# Patient Record
Sex: Male | Born: 1935 | Race: Black or African American | Hispanic: No | Smoking: Former smoker
Health system: Southern US, Community
[De-identification: ages and names within clinical notes are randomized; demographics above are authoritative.]

## PROBLEM LIST (undated history)

## (undated) DIAGNOSIS — I214 Non-ST elevation (NSTEMI) myocardial infarction: Secondary | ICD-10-CM

## (undated) DIAGNOSIS — I5020 Unspecified systolic (congestive) heart failure: Secondary | ICD-10-CM

## (undated) DIAGNOSIS — E785 Hyperlipidemia, unspecified: Secondary | ICD-10-CM

## (undated) DIAGNOSIS — I2510 Atherosclerotic heart disease of native coronary artery without angina pectoris: Secondary | ICD-10-CM

## (undated) DIAGNOSIS — I10 Essential (primary) hypertension: Secondary | ICD-10-CM

## (undated) DIAGNOSIS — N1832 Chronic kidney disease, stage 3b: Secondary | ICD-10-CM

## (undated) DIAGNOSIS — I509 Heart failure, unspecified: Secondary | ICD-10-CM

## (undated) DIAGNOSIS — J302 Other seasonal allergic rhinitis: Secondary | ICD-10-CM

## (undated) DIAGNOSIS — T782XXA Anaphylactic shock, unspecified, initial encounter: Secondary | ICD-10-CM

## (undated) DIAGNOSIS — I219 Acute myocardial infarction, unspecified: Secondary | ICD-10-CM

## (undated) HISTORY — DX: Acute myocardial infarction, unspecified: I21.9

## (undated) HISTORY — DX: Essential (primary) hypertension: I10

## (undated) HISTORY — PX: CORONARY ANGIOPLASTY WITH STENT PLACEMENT: SHX49

## (undated) HISTORY — PX: CARDIAC CATHETERIZATION: SHX172

## (undated) HISTORY — DX: Chronic kidney disease, stage 3b: N18.32

## (undated) HISTORY — DX: Hyperlipidemia, unspecified: E78.5

## (undated) HISTORY — DX: Atherosclerotic heart disease of native coronary artery without angina pectoris: I25.10

## (undated) HISTORY — DX: Heart failure, unspecified: I50.9

## (undated) HISTORY — DX: Anaphylactic shock, unspecified, initial encounter: T78.2XXA

## (undated) HISTORY — DX: Unspecified systolic (congestive) heart failure: I50.20

---

## 1996-06-30 DIAGNOSIS — I219 Acute myocardial infarction, unspecified: Secondary | ICD-10-CM

## 1996-06-30 HISTORY — DX: Acute myocardial infarction, unspecified: I21.9

## 2000-12-20 ENCOUNTER — Encounter: Payer: Self-pay | Admitting: Emergency Medicine

## 2000-12-20 ENCOUNTER — Emergency Department (HOSPITAL_COMMUNITY): Admission: EM | Admit: 2000-12-20 | Discharge: 2000-12-20 | Payer: Self-pay | Admitting: Emergency Medicine

## 2005-07-15 ENCOUNTER — Emergency Department (HOSPITAL_COMMUNITY): Admission: EM | Admit: 2005-07-15 | Discharge: 2005-07-15 | Payer: Self-pay | Admitting: Emergency Medicine

## 2008-03-30 DIAGNOSIS — I214 Non-ST elevation (NSTEMI) myocardial infarction: Secondary | ICD-10-CM

## 2008-03-30 HISTORY — DX: Non-ST elevation (NSTEMI) myocardial infarction: I21.4

## 2008-04-24 ENCOUNTER — Inpatient Hospital Stay (HOSPITAL_COMMUNITY): Admission: AD | Admit: 2008-04-24 | Discharge: 2008-04-28 | Payer: Self-pay | Admitting: Cardiology

## 2008-04-24 ENCOUNTER — Ambulatory Visit: Payer: Self-pay | Admitting: Cardiovascular Disease

## 2008-04-25 ENCOUNTER — Encounter: Payer: Self-pay | Admitting: Cardiology

## 2008-05-12 ENCOUNTER — Ambulatory Visit: Payer: Self-pay | Admitting: Cardiovascular Disease

## 2008-05-23 ENCOUNTER — Encounter (HOSPITAL_COMMUNITY): Admission: RE | Admit: 2008-05-23 | Discharge: 2008-06-28 | Payer: Self-pay | Admitting: Cardiology

## 2008-07-03 ENCOUNTER — Encounter (HOSPITAL_COMMUNITY): Admission: RE | Admit: 2008-07-03 | Discharge: 2008-08-02 | Payer: Self-pay | Admitting: Cardiovascular Disease

## 2008-08-02 ENCOUNTER — Encounter (HOSPITAL_COMMUNITY): Admission: RE | Admit: 2008-08-02 | Discharge: 2008-09-01 | Payer: Self-pay | Admitting: Cardiovascular Disease

## 2008-09-01 ENCOUNTER — Encounter (HOSPITAL_COMMUNITY): Admission: RE | Admit: 2008-09-01 | Discharge: 2008-10-01 | Payer: Self-pay | Admitting: Cardiovascular Disease

## 2008-09-15 ENCOUNTER — Ambulatory Visit: Payer: Self-pay | Admitting: Cardiology

## 2008-09-19 ENCOUNTER — Ambulatory Visit: Payer: Self-pay | Admitting: Cardiology

## 2008-09-19 ENCOUNTER — Ambulatory Visit (HOSPITAL_COMMUNITY): Admission: RE | Admit: 2008-09-19 | Discharge: 2008-09-19 | Payer: Self-pay | Admitting: Cardiology

## 2008-09-19 ENCOUNTER — Encounter: Payer: Self-pay | Admitting: Cardiology

## 2008-10-23 ENCOUNTER — Ambulatory Visit: Payer: Self-pay | Admitting: Cardiology

## 2009-01-03 ENCOUNTER — Encounter (INDEPENDENT_AMBULATORY_CARE_PROVIDER_SITE_OTHER): Payer: Self-pay | Admitting: *Deleted

## 2009-01-03 ENCOUNTER — Encounter: Payer: Self-pay | Admitting: Cardiology

## 2009-01-03 LAB — CONVERTED CEMR LAB
ALT: 21 units/L
ALT: 21 units/L
AST: 23 units/L
AST: 23 units/L
Albumin: 3.9 g/dL
Albumin: 3.9 g/dL
Alkaline Phosphatase: 36 units/L
Alkaline Phosphatase: 36 units/L
Bilirubin, Direct: 0.1 mg/dL
Bilirubin, Direct: 0.1 mg/dL
Cholesterol: 152 mg/dL
Cholesterol: 152 mg/dL
HDL: 29 mg/dL
HDL: 29 mg/dL
LDL Cholesterol: 96 mg/dL
LDL Cholesterol: 96 mg/dL
Total Protein: 6.6 g/dL
Total Protein: 6.6 g/dL
Triglycerides: 137 mg/dL
Triglycerides: 137 mg/dL

## 2009-01-05 LAB — CONVERTED CEMR LAB
ALT: 21 units/L (ref 0–53)
AST: 23 units/L (ref 0–37)
Albumin: 3.9 g/dL (ref 3.5–5.2)
Alkaline Phosphatase: 36 units/L — ABNORMAL LOW (ref 39–117)
Bilirubin, Direct: 0.1 mg/dL (ref 0.0–0.3)
Cholesterol: 152 mg/dL (ref 0–200)
HDL: 29 mg/dL — ABNORMAL LOW (ref >39)
Indirect Bilirubin: 0.6 mg/dL (ref 0.0–0.9)
LDL Cholesterol: 96 mg/dL (ref 0–99)
Total Bilirubin: 0.7 mg/dL (ref 0.3–1.2)
Total CHOL/HDL Ratio: 5.2
Total Protein: 6.6 g/dL (ref 6.0–8.3)
Triglycerides: 137 mg/dL (ref <150)
VLDL: 27 mg/dL (ref 0–40)

## 2009-01-09 ENCOUNTER — Telehealth (INDEPENDENT_AMBULATORY_CARE_PROVIDER_SITE_OTHER): Payer: Self-pay | Admitting: *Deleted

## 2009-03-12 ENCOUNTER — Telehealth (INDEPENDENT_AMBULATORY_CARE_PROVIDER_SITE_OTHER): Payer: Self-pay | Admitting: *Deleted

## 2009-03-12 ENCOUNTER — Encounter: Payer: Self-pay | Admitting: Cardiology

## 2009-04-23 ENCOUNTER — Encounter: Payer: Self-pay | Admitting: Adult Health

## 2009-06-12 ENCOUNTER — Emergency Department (HOSPITAL_COMMUNITY): Admission: EM | Admit: 2009-06-12 | Discharge: 2009-06-12 | Payer: Self-pay | Admitting: Emergency Medicine

## 2009-07-19 ENCOUNTER — Telehealth (INDEPENDENT_AMBULATORY_CARE_PROVIDER_SITE_OTHER): Payer: Self-pay

## 2009-07-25 ENCOUNTER — Encounter (INDEPENDENT_AMBULATORY_CARE_PROVIDER_SITE_OTHER): Payer: Self-pay | Admitting: *Deleted

## 2009-07-26 ENCOUNTER — Encounter (INDEPENDENT_AMBULATORY_CARE_PROVIDER_SITE_OTHER): Payer: Self-pay | Admitting: *Deleted

## 2009-08-09 ENCOUNTER — Ambulatory Visit: Payer: Self-pay | Admitting: Cardiology

## 2009-08-09 DIAGNOSIS — E782 Mixed hyperlipidemia: Secondary | ICD-10-CM | POA: Insufficient documentation

## 2009-08-09 DIAGNOSIS — I2510 Atherosclerotic heart disease of native coronary artery without angina pectoris: Secondary | ICD-10-CM | POA: Insufficient documentation

## 2009-08-09 DIAGNOSIS — I10 Essential (primary) hypertension: Secondary | ICD-10-CM | POA: Insufficient documentation

## 2009-08-10 ENCOUNTER — Encounter: Payer: Self-pay | Admitting: Cardiology

## 2009-08-15 LAB — CONVERTED CEMR LAB
ALT: 24 units/L (ref 0–53)
AST: 22 units/L (ref 0–37)
Albumin: 4.3 g/dL (ref 3.5–5.2)
Alkaline Phosphatase: 37 units/L — ABNORMAL LOW (ref 39–117)
Cholesterol: 135 mg/dL (ref 0–200)
HDL: 27 mg/dL — ABNORMAL LOW (ref >39)
Indirect Bilirubin: 0.4 mg/dL (ref 0.0–0.9)
LDL Cholesterol: 79 mg/dL (ref 0–99)
Total Protein: 7 g/dL (ref 6.0–8.3)
Triglycerides: 146 mg/dL (ref <150)

## 2009-08-16 ENCOUNTER — Encounter: Payer: Self-pay | Admitting: Cardiology

## 2010-04-17 ENCOUNTER — Ambulatory Visit: Payer: Self-pay | Admitting: Cardiology

## 2010-04-18 ENCOUNTER — Telehealth: Payer: Self-pay | Admitting: Cardiology

## 2010-05-01 ENCOUNTER — Encounter: Payer: Self-pay | Admitting: Cardiology

## 2010-05-01 LAB — CONVERTED CEMR LAB
Alkaline Phosphatase: 37 units/L — ABNORMAL LOW (ref 39–117)
Bilirubin, Direct: 0.1 mg/dL (ref 0.0–0.3)
Indirect Bilirubin: 0.5 mg/dL (ref 0.0–0.9)
LDL Cholesterol: 82 mg/dL (ref 0–99)
Total Bilirubin: 0.6 mg/dL (ref 0.3–1.2)
Total Protein: 6.8 g/dL (ref 6.0–8.3)
Triglycerides: 133 mg/dL (ref <150)
VLDL: 27 mg/dL (ref 0–40)

## 2010-07-30 NOTE — Assessment & Plan Note (Signed)
Summary: ROV  Medications Added LIPITOR 80 MG TABS (ATORVASTATIN CALCIUM) Take one tablet by mouth daily. PLAVIX 75 MG TABS (CLOPIDOGREL BISULFATE) Take 1   tablet by mouth daily LISINOPRIL 20 MG TABS (LISINOPRIL) Take one tablet by mouth twice daily AMLODIPINE BESYLATE 2.5 MG TABS (AMLODIPINE BESYLATE) take 1 tablet by mouth once daily      Allergies Added: NKDA  Visit Type:  Follow-up Primary Provider:  Dr. Karleen Hampshire   History of Present Illness: 75 year old male presents for followup. He reports doing well without any significant angina or progressive shortness of breath. He has not been exercising as regularly, but is planning to return to the [facility]. He reports compliance with his medications.  Followup labs from July 2010 revealed ALT 21, AST 23, LDL 96, HDL 29, triglycerides 137, total cholesterol 152.  Today we discussed his blood pressure and also a goal for weight loss. He is due for a followup lipid profile and liver function tests as well.  Current Medications (verified): 1)  Lipitor 80 Mg Tabs (Atorvastatin Calcium) .... Take One Tablet By Mouth Daily. 2)  Plavix 75 Mg Tabs (Clopidogrel Bisulfate) .... Take 1   Tablet By Mouth Daily 3)  Carvedilol 12.5 Mg Tabs (Carvedilol) .... Take One Tablet By Mouth Twice A Day 4)  Lisinopril 20 Mg Tabs (Lisinopril) .... Take One Tablet By Mouth Twice Daily 5)  Nitrolingual 0.4 Mg/spray Soln (Nitroglycerin) .... Use 1 Spray Every 5 Minutes X3 For Chest Pain 6)  Amlodipine Besylate 2.5 Mg Tabs (Amlodipine Besylate) .... Take 1 Tablet By Mouth Once Daily  Allergies (verified): No Known Drug Allergies  Past History:  Past Medical History: Last updated: 07/31/2009 CAD - BMS to RCA and circumflex 10/09, LVEF 55% Aspirin allergy - anaphylaxis Hyperlipidemia Myocardial Infarction - NSTEMI 10/09 Hypertension  Social History: Last updated: 07/31/2009 Widowed  Tobacco Use - No Alcohol Use - no  Clinical Review  Panels:  Echocardiogram Echocardiogram SUMMARY   -  Overall left ventricular systolic function was at the lower         limits of normal. Left ventricular ejection fraction was         estimated , range being 50 % to 55 %. There was no diagnostic         evidence of left ventricular regional wall motion         abnormalities. Left ventricular wall thickness was mildly         increased.   -  There was mild mitral annular calcification.   -  The left atrium was mildly dilated. (09/19/2008)  Cardiac Imaging Cardiac Cath Findings FINDINGS:  Aortic pressure 144/82 with mean of 108, left ventricular   pressure 143/15.      Coronary angiography, left mainstem:  There is no significant stenosis.   The left main bifurcates into the LAD and left circumflex.  There is   mild plaque in the distal left main, but again it appears widely patent.      LAD:  The LAD is a large caliber vessel.  It courses down and reaches   the LV apex.  There is mild calcification throughout.  Just at the first   septal perforator and first diagonal, there is a moderate 50% stenosis   of the LAD.  The first septal perforator has a tight ostial stenosis and   the first diagonal branch has a 30-40% proximal stenosis.  The LAD   tapers in size just after the first diagonal.  There are no high-grade   stenosis throughout the LAD or its branch vessels.      Left circumflex:  The left circumflex has a severe proximal stenosis.   There is an intermediate branch that is widely patent.  The proximal   circumflex has a 90% eccentric stenosis just before the first OM branch.   The circumflex then courses down and bifurcates into twin vessels   supplying two more obtuse marginal branches.  There is TIMI III flow on   the left circumflex.      Right coronary artery:  The right coronary artery has moderate ostial   stenosis at 50%.  The midportion of the right coronary artery is totally   occluded.  The distal branches of  the right coronary artery fill faintly   from left-to-right collaterals.  The RCA is occluded in the midportion   just beyond the origin of the first RV marginal branch.  There is TIMI 0   flow beyond that region.      Left ventriculography demonstrates a large area of inferior wall   akinesis from the base of the inferior wall all the way to the distal   inferior wall but spares the true apex.  The remaining portions of the   left ventricle are hyperdynamic.  The LVEF is estimated at 45%.  There   is no significant mitral regurgitation. (04/24/2008)    Review of Systems  The patient denies anorexia, fever, weight loss, chest pain, syncope, peripheral edema, prolonged cough, headaches, hemoptysis, melena, and hematochezia.         Otherwise reviewed and negative.  Vital Signs:  Patient profile:   75 year old male Height:      68 inches Weight:      234 pounds BMI:     35.71 Pulse rate:   54 / minute BP sitting:   176 / 92  (right arm)  Vitals Entered By: Dreama Saa, CNA (August 09, 2009 3:14 PM)  Physical Exam  Additional Exam:  Obese male in no acute distress. HEENT: Conjunctiva and lids normal, oropharynx clear. Neck: Supple, no elevated jugular venous pressure or carotid bruits. Lungs: Clear to auscultation, nonlabored. Cardiac: Regular rate and rhythm, no S3. Abdomen: Soft, nontender, no bruits. Extremities: No pitting edema, distal pulses one plus. Skin: Warm and dry. Musculoskeletal: No kyphosis. Neuropsychiatric: Alert and oriented x3, affect appropriate.   Impression & Recommendations:  Problem # 1:  CORONARY ATHEROSCLEROSIS NATIVE CORONARY ARTERY (ICD-414.01)  Clinically stable on present medical regimen. I have encouraged more regular exercise and weight loss. Patient plans to resume his work out at [employer]. I will see him back in 6 months.  His updated medication list for this problem includes:    Plavix 75 Mg Tabs (Clopidogrel bisulfate) .[name]... Take  1   tablet by mouth daily    Carvedilol 12.5 Mg Tabs (Carvedilol) .[name]... Take one tablet by mouth twice a day    Lisinopril 20 Mg Tabs (Lisinopril) .[name]... Take one tablet by mouth twice daily    Nitrolingual 0.4 Mg/spray Soln (Nitroglycerin) ..... Use 1 spray every 5 minutes x3 for chest pain    Amlodipine Besylate 2.5 Mg Tabs (Amlodipine besylate) .[name]... Take 1 tablet by mouth once daily  Problem # 2:  HYPERTENSION (ICD-401.9)  Blood pressure not well controlled. Patient will work on weight loss. We discussed adding amlodipine 2.5 mg daily as well.  His updated medication list for this problem includes:    Carvedilol 12.5  Mg Tabs (Carvedilol) .[name]... Take one tablet by mouth twice a day    Lisinopril 20 Mg Tabs (Lisinopril) .[name]... Take one tablet by mouth twice daily    Amlodipine Besylate 2.5 Mg Tabs (Amlodipine besylate) .[name]... Take 1 tablet by mouth once daily  Problem # 3:  HYPERLIPIDEMIA (ICD-272.4)  Followup lipid profile and liver function tests. He continues on Lipitor.  His updated medication list for this problem includes:    Lipitor 80 Mg Tabs (Atorvastatin calcium) .[name]... Take one tablet by mouth daily.  Orders: T-Lipid Profile [id number]) T-Hepatic Function [id number])  Other Orders: TLB-PSA (Prostate Specific Antigen) (84153-PSA)  Patient Instructions: 1)  Your physician recommends that you schedule a follow-up appointment in: 6 months 2)  Your physician recommends that you return for lab work in: This week 3)  Your physician has recommended you make the following change in your medication: Start taking Amlodipine 2.5mg  by mouth once daily  Prescriptions: LIPITOR 80 MG TABS (ATORVASTATIN CALCIUM) Take one tablet by mouth daily.  #90 x 1   Entered by:   Larita Fife Via LPN   Authorized by:   Loreli Slot, MD, [facility]   Signed by:   Larita Fife Via LPN on 13/01/6577   Method used:   Electronically to        [facility]  [address] (retail)       [address]        [place], [place]  [address]       Ph: [phone]       Fax: [phone]   RxID:   [id number] AMLODIPINE BESYLATE 2.5 MG TABS (AMLODIPINE BESYLATE) take 1 tablet by mouth once daily  #90 x 1   Entered by:   Larita Fife Via LPN   Authorized by:   Loreli Slot, MD, [facility]   Signed by:   Larita Fife Via LPN on 64/33/2951   Method used:   Electronically to        [facility]  [place] [address] (retail)       [address]       [place], [place]  [address]       Ph: [phone]       Fax: [phone]   RxID:   [id number] AMLODIPINE BESYLATE 2.5 MG TABS (AMLODIPINE BESYLATE) take 1 tablet by mouth once daily  #30 x 5   Entered by:   Larita Fife Via LPN   Authorized by:   Loreli Slot, MD, [facility]   Signed by:   Larita Fife Via LPN on 51/76/1607   Method used:   Electronically to        [facility]   [address] (retail)       [address]       [place], [place]  [address]       Ph: [phone]       Fax: [phone]   RxID:   [id number]  Patient wanted 90 day supply, [facility] notified.

## 2010-07-30 NOTE — Letter (Signed)
Summary: [place] Results Engineer, agricultural at [facility]  [address] [place] [address]   Phone: [phone]  Fax: [phone]      August 16, 2009 MRN: [id number]   [facility] [address] [place], [place]  [address]   Dear Mr. [facility],  Your test ordered by Selena Batten has been reviewed by your physician (or physician assistant) and was found to be normal or stable. Your physician (or physician assistant) felt no changes were needed at this time.  ____ Echocardiogram  ____ Cardiac Stress Test  __X__ Lab Work  ____ Peripheral vascular study of arms, legs or neck  ____ CT scan or X-ray  ____ Lung or Breathing test  ____ Other: Please continue on current medical treatment.  Thank you.   Nona Dell, MD, F.A.C.C

## 2010-07-30 NOTE — Progress Notes (Signed)
Summary: PT NEEDS ALL MEDS REFILLED   Phone Note Call from Patient Call back at Home Phone [phone]   Caller: PT Reason for Call: Refill Medication Summary of Call: PT WALK IN THIS MORNING WANTING TO KNOW IF HIS MED HAS BEEN CALLED IN, AFTER TALKING WITH LYNN WE FIGURED OUT THAT HIS AMOLODIPINE WAS CALLED IN. I LET PATIENT KNOW TO TELL PHARMAC[facility] TO CALL us IF THEY HAVE NOT RECIEVED THEM.  PT IS BACK IN OFFICE STATING THAT ALL OF HIS MEDICATIONS CALLED IN. HE DOESNT KNOW THE NAME OF THEM, HE JUST NEEDS ALL OF THEM CALLED IN TO [facility] IN [place]. Initial call taken by: Faythe Ghee,  April 18, 2010 2:43 PM    Prescriptions: LISINOPRIL 20 MG TABS (LISINOPRIL) Take one tablet by mouth twice daily  #180 x 1   Entered by:   Larita Fife Via LPN   Authorized by:   Loreli Slot, MD, [facility]   Signed by:   Larita Fife Via LPN on 52/84/1324   Method used:   Electronically to        [facility]  [place] [address] (retail)       [address]       [place], [place]  [address]       Ph: [phone]       Fax: [phone]   RxID:   [id number] CARVEDILOL 12.5 MG TABS (CARVEDILOL) Take one tablet by mouth twice a day  #180 x 1   Entered by:   Larita Fife Via LPN   Authorized by:   Loreli Slot, MD, [facility]   Signed by:   Larita Fife Via LPN on 12/28/1599   Method used:   Electronically to        [facility]  [address] [address] (retail)       [address]       [place], [place]  [address]       Ph: [phone]       Fax: [phone]   RxID:   [id number] PLAVIX 75 MG TABS (CLOPIDOGREL BISULFATE) Take 1   tablet by mouth daily  #90 x 1   Entered by:   Larita Fife Via LPN   Authorized by:   Loreli Slot, MD, [facility]   Signed by:   Larita Fife Via LPN on 69/48/5462   Method used:   Electronically to        [facility]  [place] [address] (retail)       [address]       [place], [place]  [address]       Ph: [phone]       Fax: [phone]   RxID:    [id number] LIPITOR 80 MG TABS (ATORVASTATIN CALCIUM) Take one tablet by mouth daily.  #90 Each x 1   Entered by:   Larita Fife Via LPN   Authorized by:   Loreli Slot, MD, [facility]   Signed by:   Larita Fife Via LPN on 42/35/3614   Method used:   Electronically to        [facility] [address] (retail)       [address]       [place], [place]  [address]       Ph: [phone]       Fax: [phone]   RxID:  [id number]  

## 2010-07-30 NOTE — Letter (Signed)
Summary: [place] Results Engineer, agricultural at [facility]  [address] [place] [address]   Phone: [phone]  Fax: [phone]      May 01, 2010 MRN: [id number]   [facility] [address] [place], [place]  [address]   Dear Mr. [facility],  Your test ordered by Selena Batten has been reviewed by your physician (or physician assistant) and was found to be normal or stable. Your physician (or physician assistant) felt no changes were needed at this time.  ____ Echocardiogram  ____ Cardiac Stress Test  __X__ Lab Work  ____ Peripheral vascular study of arms, legs or neck  ____ CT scan or X-ray  ____ Lung or Breathing test  ____ Other:  Please continue on current medical treatment.  Thank you.   Nona Dell, MD, F.A.C.C

## 2010-07-30 NOTE — Miscellaneous (Signed)
Summary: labs lipids,liver 01/03/2009  Clinical Lists Changes  Observations: Added new observation of ALBUMIN: 3.9 g/dL (16/03/9603 54:09) Added new observation of PROTEIN, TOT: 6.6 g/dL (81/19/1478 29:56) Added new observation of SGPT (ALT): 21 units/L (01/03/2009 11:40) Added new observation of SGOT (AST): 23 units/L (01/03/2009 11:40) Added new observation of ALK PHOS: 36 units/L (01/03/2009 11:40) Added new observation of BILI DIRECT: 0.1 mg/dL (21/30/8657 84:69) Added new observation of LDL: 96 mg/dL (62/95/2841 32:44) Added new observation of HDL: 29 mg/dL (07/02/7251 66:44) Added new observation of TRIGLYC TOT: 137 mg/dL (03/47/4259 56:38) Added new observation of CHOLESTEROL: 152 mg/dL (75/64/3329 51:88)

## 2010-07-30 NOTE — Miscellaneous (Signed)
Summary: lipids,liver 01/03/2009  Clinical Lists Changes  Observations: Added new observation of ALBUMIN: 3.9 g/dL (62/95/2841 32:44) Added new observation of PROTEIN, TOT: 6.6 g/dL (07/02/7251 66:44) Added new observation of SGPT (ALT): 21 units/L (01/03/2009 16:42) Added new observation of SGOT (AST): 23 units/L (01/03/2009 16:42) Added new observation of ALK PHOS: 36 units/L (01/03/2009 16:42) Added new observation of BILI DIRECT: 0.1 mg/dL (03/47/4259 56:38) Added new observation of LDL: 96 mg/dL (75/64/3329 51:88) Added new observation of HDL: 29 mg/dL (41/66/0630 16:01) Added new observation of TRIGLYC TOT: 137 mg/dL (09/32/3557 32:20) Added new observation of CHOLESTEROL: 152 mg/dL (25/42/7062 37:62)

## 2010-07-30 NOTE — Progress Notes (Signed)
Summary: Refill   Phone Note Call from Patient   Caller: Patient (Walk in) Reason for Call: Refill Medication Summary of Call: Needs refill for Lipitor called to [facility] in [place] Initial call taken by: Raechel Ache [facility],  July 19, 2009 3:26 PM    Prescriptions: LIPITOR 80 MG TABS (ATORVASTATIN CALCIUM) Take one tablet by mouth daily.  #30 x 0   Entered by:   Larita Fife Via LPN   Authorized by:   Loreli Slot, MD, [facility]   Signed by:   Larita Fife Via LPN on 81/19/1478   Method used:   Electronically to        [facility]  [address] (retail)       [address]       [place], [place]  [address]       Ph: [phone]       Fax: [phone]   RxID:   [id number]   Appended Document: Refill     Phone Note Outgoing Call   Call placed by: Larita Fife Via LPN,  July 19, 2009 4:05 PM Action Taken: Appt scheduled Summary of Call: Patient is overdue for f/u. I advised him that he must be seen in this office in order for [place] to continue refills. I sent 30 day suppy of Lipitor to [facility] ph[facility]. and patient scheduled appt. to fu with Dr. Diona Browner.

## 2010-07-30 NOTE — Assessment & Plan Note (Signed)
Summary: 6 mth f/u per checkout on 04/16/10/tg  Medications Added AMLODIPINE BESYLATE 5 MG TABS (AMLODIPINE BESYLATE) take 1 tablet by mouth once daily      Allergies Added: NKDA  Visit Type:  Follow-up Primary Provider:  Dr. Karleen Hampshire   History of Present Illness: 75 year old male presents for followup. He was seen back in February. He continues to exercise at the [facility] 5-6 days a week. No reported angina or progressive shortness of breath. He celebrated a  birthday since I last saw him.  Labs obtained back in February demonstrated cholesterol 135, triglycerides 146, HDL 27, LDL 79, AST 22, ALT 24, PSA 0.63. He is due for followup lipids and liver function tests. He states he plans to get these done with Dr. Regino Schultze.  He is just at 2 years out from his cardiac intervention. ECG is reviewed below.  Blood pressure remains elevated. He states that systolics generally are "150" when he checks at [facility].  Clinical Review Panels:  Cardiac Imaging Cardiac Cath Findings FINDINGS:  Aortic pressure 144/82 with mean of 108, left ventricular   pressure 143/15.      Coronary angiography, left mainstem:  There is no significant stenosis.   The left main bifurcates into the LAD and left circumflex.  There is   mild plaque in the distal left main, but again it appears widely patent.      LAD:  The LAD is a large caliber vessel.  It courses down and reaches   the LV apex.  There is mild calcification throughout.  Just at the first   septal perforator and first diagonal, there is a moderate 50% stenosis   of the LAD.  The first septal perforator has a tight ostial stenosis and   the first diagonal branch has a 30-40% proximal stenosis.  The LAD   tapers in size just after the first diagonal.  There are no high-grade   stenosis throughout the LAD or its branch vessels.      Left circumflex:  The left circumflex has a severe proximal stenosis.   There is an intermediate branch that is  widely patent.  The proximal   circumflex has a 90% eccentric stenosis just before the first OM branch.   The circumflex then courses down and bifurcates into twin vessels   supplying two more obtuse marginal branches.  There is TIMI III flow on   the left circumflex.      Right coronary artery:  The right coronary artery has moderate ostial   stenosis at 50%.  The midportion of the right coronary artery is totally   occluded.  The distal branches of the right coronary artery fill faintly   from left-to-right collaterals.  The RCA is occluded in the midportion   just beyond the origin of the first RV marginal branch.  There is TIMI 0   flow beyond that region.      Left ventriculography demonstrates a large area of inferior wall   akinesis from the base of the inferior wall all the way to the distal   inferior wall but spares the true apex.  The remaining portions of the   left ventricle are hyperdynamic.  The LVEF is estimated at 45%.  There   is no significant mitral regurgitation. (04/24/2008)    Current Medications (verified): 1)  Lipitor 80 Mg Tabs (Atorvastatin Calcium) .... Take One Tablet By Mouth Daily. 2)  Plavix 75 Mg Tabs (Clopidogrel Bisulfate) .... Take 1   Tablet  By Mouth Daily 3)  Carvedilol 12.5 Mg Tabs (Carvedilol) .... Take One Tablet By Mouth Twice A Day 4)  Lisinopril 20 Mg Tabs (Lisinopril) .... Take One Tablet By Mouth Twice Daily 5)  Nitrolingual 0.4 Mg/spray Soln (Nitroglycerin) .... Use 1 Spray Every 5 Minutes X3 For Chest Pain 6)  Amlodipine Besylate 5 Mg Tabs (Amlodipine Besylate) .... Take 1 Tablet By Mouth Once Daily  Allergies (verified): No Known Drug Allergies  Comments:  Nurse/Medical Assistant: patient reviewed med list from previous ov and stated that meds are correct  Past History:  Past Medical History: Last updated: 07/31/2009 CAD - BMS to RCA and circumflex 10/09, LVEF 55% Aspirin allergy - anaphylaxis Hyperlipidemia Myocardial  Infarction - NSTEMI 10/09 Hypertension  Social History: Last updated: 07/31/2009 Widowed  Tobacco Use - No Alcohol Use - no  Review of Systems  The patient denies anorexia, fever, weight loss, chest pain, syncope, dyspnea on exertion, peripheral edema, melena, hematochezia, and severe indigestion/heartburn.         Oreviewed and negative except as outlined.  Vital Signs:  Patient profile:   75 year old male Weight:      229 pounds BMI:     34.95 Pulse rate:   58 / minute BP sitting:   157 / 83  (right arm)  Vitals Entered By: Dreama Saa, CNA (April 17, 2010 3:15 PM)  Physical Exam  Additional Exam:  Obese male in no acute distress. HEENT: Conjunctiva and lids normal, oropharynx clear. Neck: Supple, no elevated jugular venous pressure or carotid bruits. Lungs: Clear to auscultation, nonlabored. Cardiac: Regular rate and rhythm, no S3. Abdomen: Soft, nontender, no bruits. Extremities: No pitting edema, distal pulses one plus. Skin: Warm and dry. Musculoskeletal: No kyphosis. Neuropsychiatric: Alert and oriented x3, affect appropriate.   EKG  Procedure date:  04/17/2010  Findings:      Sinus rhythm at 63 beats per minutes with occasional frequent PACs, nonspecific ST changes with inferolateral T wave inversions that are old.  Impression & Recommendations:  Problem # 1:  CORONARY ATHEROSCLEROSIS NATIVE CORONARY ARTERY (ICD-414.01)  Symptomatically stable on medical therapy. I encouraged continued regular exercise. Followup in 6 months, sooner if needed.  His updated medication list for this problem includes:    Plavix 75 Mg Tabs (Clopidogrel bisulfate) .[name]... Take 1   tablet by mouth daily    Carvedilol 12.5 Mg Tabs (Carvedilol) .[name]... Take one tablet by mouth twice a day    Lisinopril 20 Mg Tabs (Lisinopril) .[name]... Take one tablet by mouth twice daily    Nitrolingual 0.4 Mg/spray Soln (Nitroglycerin) ..... Use 1 spray every 5 minutes x3 for chest pain     Amlodipine Besylate 5 Mg Tabs (Amlodipine besylate) .[name]... Take 1 tablet by mouth once daily  Problem # 2:  HYPERLIPIDEMIA (ICD-272.4)  Patient due for followup lipid profile and liver function tests. He plans to get these done with Dr. Regino Schultze.  His updated medication list for this problem includes:    Lipitor 80 Mg Tabs (Atorvastatin calcium) .[name]... Take one tablet by mouth daily.  Orders: T-Lipid Profile [phone]) T-Hepatic Function [phone])  Problem # 3:  HYPERTENSION (ICD-401.9)  Blood pressure not optimally controlled. We have discussed sodium restriction. Norvasc will be increased to 5 mg daily.  His updated medication list for this problem includes:    Carvedilol 12.5 Mg Tabs (Carvedilol) .[name]... Take one tablet by mouth twice a day    Lisinopril 20 Mg Tabs (Lisinopril) .[name]... Take one tablet by mouth twice  daily    Amlodipine Besylate 5 Mg Tabs (Amlodipine besylate) .[name]... Take 1 tablet by mouth once daily  His updated medication list for this problem includes:    Carvedilol 12.5 Mg Tabs (Carvedilol) .[name]... Take one tablet by mouth twice a day    Lisinopril 20 Mg Tabs (Lisinopril) .[name]... Take one tablet by mouth twice daily    Amlodipine Besylate 5 Mg Tabs (Amlodipine besylate) .[name]... Take 1 tablet by mouth once daily  Patient Instructions: 1)  Your physician recommends that you schedule a follow-up appointment in: 6 months 2)  Your physician recommends that you return for lab work in: This week 3)  Your physician has recommended you make the following change in your medication: Increase Norvasc to 5mg  by mouth once daily  Prescriptions: AMLODIPINE BESYLATE 5 MG TABS (AMLODIPINE BESYLATE) take 1 tablet by mouth once daily  #30 x 6   Entered by:   Larita Fife Via LPN   Authorized by:   Loreli Slot, MD, [facility]   Signed by:   Larita Fife Via LPN on 16/03/9603   Method used:   Electronically to        [facility]  [address] (retail)       [address]       [place], [place]  [address]       Ph: [phone]       Fax: [phone]   RxID:   [id number]

## 2010-10-11 ENCOUNTER — Ambulatory Visit: Payer: Self-pay | Admitting: Cardiology

## 2010-10-17 ENCOUNTER — Other Ambulatory Visit: Payer: Self-pay | Admitting: Cardiology

## 2010-10-18 ENCOUNTER — Other Ambulatory Visit: Payer: Self-pay

## 2010-10-18 MED ORDER — CARVEDILOL 12.5 MG PO TABS: 12.5000 mg | ORAL_TABLET | Freq: Two times a day (BID) | ORAL | Status: DC

## 2010-11-05 ENCOUNTER — Ambulatory Visit (INDEPENDENT_AMBULATORY_CARE_PROVIDER_SITE_OTHER): Payer: 59 | Admitting: Cardiology

## 2010-11-05 ENCOUNTER — Encounter: Payer: Self-pay | Admitting: Cardiology

## 2010-11-05 VITALS — BP 123/69 | HR 65 | Wt 231.0 lb

## 2010-11-05 DIAGNOSIS — I10 Essential (primary) hypertension: Secondary | ICD-10-CM

## 2010-11-05 DIAGNOSIS — I2510 Atherosclerotic heart disease of native coronary artery without angina pectoris: Secondary | ICD-10-CM

## 2010-11-05 DIAGNOSIS — E782 Mixed hyperlipidemia: Secondary | ICD-10-CM

## 2010-11-05 NOTE — Patient Instructions (Signed)
**Note De-Identified Jerilyn Gillaspie Obfuscation** Your physician recommends that you continue on your current medications as directed. Please refer to the Current Medication list given to you today.  Your physician recommends that you return for lab work in: this week  Your physician recommends that you schedule a follow-up appointment in: 6 months

## 2010-11-05 NOTE — Assessment & Plan Note (Signed)
Followup fasting lipid profile and liver function tests.

## 2010-11-05 NOTE — Progress Notes (Signed)
Clinical Summary Corey [name] is a 75 y.o.male presenting for followup. He was seen in October 2011. Reports occasional chest pain, not specifically exertional. He has not required any nitroglycerin. Continues to exercise. He does not feel like the symptoms are progressing.  Lab work from November 2011 showed cholesterol 138, triglycerides 133, HDL 29, LDL 82, AST 23, ALT 31. Due for followup labs now.   Allergies  Allergen Reactions  . Penicillins     Current outpatient prescriptions:amLODipine (NORVASC) 5 MG tablet, Take 5 mg by mouth daily.  , Disp: , Rfl: ;  Ascorbic Acid (VITAMIN C) 1000 MG tablet, Take 1,000 mg by mouth daily.  , Disp: , Rfl: ;  atorvastatin (LIPITOR) 80 MG tablet, Take 80 mg by mouth daily.  , Disp: , Rfl: ;  carvedilol (COREG) 12.5 MG tablet, Take 1 tablet (12.5 mg total) by mouth 2 (two) times daily with a meal., Disp: 60 tablet, Rfl: 3 clopidogrel (PLAVIX) 75 MG tablet, Take 75 mg by mouth daily.  , Disp: , Rfl: ;  lisinopril (PRINIVIL,ZESTRIL) 20 MG tablet, Take 20 mg by mouth 2 (two) times daily. , Disp: , Rfl: ;  nitroGLYCERIN (NITROSTAT) 0.4 MG SL tablet, Place 0.4 mg under the tongue every 5 (five) minutes as needed.  , Disp: , Rfl: ;  vitamin E 1000 UNIT capsule, Take 1,000 Units by mouth daily.  , Disp: , Rfl:   Past Medical History  Diagnosis Date  . Coronary atherosclerosis of native coronary artery     BMS to RCA and circumflex 10/09, LVEF 55%  . Anaphylaxis     Aspirin  . Hyperlipidemia   . Hypertension   . Myocardial infarction     NSTEMI 10/09    Social History Mr. Zion reports that he has never smoked. He has never used smokeless tobacco. Mr. Conover reports that he does not drink alcohol.  Review of Systems Otherwise reviewed and negative.  Physical Examination Filed Vitals:   11/05/10 1417  BP: 123/69  Pulse: 65  Obese male in no acute distress. HEENT: Conjunctiva and lids normal, oropharynx clear. Neck: Supple, no elevated  jugular venous pressure or carotid bruits. Lungs: Clear to auscultation, nonlabored. Cardiac: Regular rate and rhythm, no S3. Abdomen: Soft, nontender, no bruits. Extremities: No pitting edema, distal pulses one plus. Skin: Warm and dry. Musculoskeletal: No kyphosis. Neuropsychiatric: Alert and oriented x3, affect appropriate.   ECG Sinus rhythm at 63, LVH, nonspecific ST changes.   Problem List and Plan

## 2010-11-05 NOTE — Assessment & Plan Note (Signed)
Symptomatically stable, ECG reviewed. Continue medical therapy, exercise, observation.

## 2010-11-05 NOTE — Assessment & Plan Note (Signed)
Blood pressure control looks good today. 

## 2010-11-07 ENCOUNTER — Other Ambulatory Visit: Payer: Self-pay | Admitting: Cardiology

## 2010-11-08 LAB — LIPID PANEL
HDL: 29 mg/dL — ABNORMAL LOW (ref >39)
Triglycerides: 121 mg/dL (ref <150)

## 2010-11-08 LAB — HEPATIC FUNCTION PANEL
ALT: 24 U/L (ref 0–53)
Albumin: 3.9 g/dL (ref 3.5–5.2)
Total Protein: 6.9 g/dL (ref 6.0–8.3)

## 2010-11-12 NOTE — Discharge Summary (Signed)
Corey [name], Corey [name]            ACCOUNT NO.:  [id number]   MEDICAL RECORD NO.:  [id number]          PATIENT TYPE:  INP   LOCATION:  6526                         FACILITY:  [facility]   PHYSICIAN:  Veverly Fells. Excell Seltzer, MD  DATE OF BIRTH:  March 20, 1936   DATE OF ADMISSION:  04/24/2008  DATE OF DISCHARGE:  04/28/2008                               DISCHARGE SUMMARY   PRIMARY CARDIOLOGIST:  Veverly Fells. Excell Seltzer, MD   PRIMARY CARE PHYSICIAN:  [name], MD, in [facility], [place].   PROCEDURES PERFORMED DURING HOSPITALIZATION:  1. Cardiac catheterization completed per Dr. Tonny Bollman on April 24, 2008, revealing total occlusion of the right coronary artery,      severe left circumflex stenosis, moderate left anterior descending      stenosis, mild left ventricular dysfunction, and inferior wall      akinesis, but overall left ventricular ejection fraction      approximately 45%.      a.     Successful percutaneous coronary intervention using bare-       metal stent to the right coronary artery.      b.     Staged percutaneous coronary intervention to the left       circumflex disease using bare-metal stent.  2. A 2-D echocardiogram dated April 25, 2008, revealing overall left      ventricular systolic function was mildly decreased left ventricular      ejection fraction with estimated range of 45-50%.  Findings      suggestive of hypokinesis in the posterior wall, left ventricular      wall thickness was mildly increased, left atrium was mildly      dilated.   FINAL DISCHARGE DIAGNOSES:  1. Non-ST elevated myocardial infarction.      a.     Status post cardiac catheterization revealing totally       occluded right coronary artery, proximal circumflex; 90% eccentric       stenosis as before, OM branch; LAD 50% stenosis of the first       septal perforator and first diagonal 30-40% stenosis.      b.     Status post percutaneous coronary intervention using bare-    metal stent to a totally occluded right coronary artery.      c.     Staged percutaneous coronary intervention of the left       circumflex with staged percutaneous coronary intervention with       bare-metal stent to circumflex.  2. Hypertension.  3. Hyperlipidemia.  4. Obesity.   HOSPITAL COURSE:  This is a 75 year old male with previous history of  CAD.  The patient had not been seen by cardiologist in greater than 10  years and then had a remote history of PTCA and stent x3 in 1998,  although we are uncertain which vessels were intervened upon.  The  patient was in his usual state of health and then had sudden onset of  substernal chest pain.  The patient went to [facility] Emergency Room.  In the emergency room, the  pain continued and was transferred emergently  to [facility] Cardiac Catheterization Lab.   The patient did undergo cardiac catheterization revealing a totally  occluded right coronary artery that had emergent PCI, completed using a  bare-metal stent to decrease it to 0%.  The patient also had some  residual left circumflex stenosis and had subsequent staged PCI using a  bare-metal stent approximately 2 days later.  The patient was placed on  Plavix, aspirin, metoprolol, enalapril, and statin.  The patient's blood  pressure was well controlled during hospitalization and was found to be  stable.  The patient had no recurrence of chest discomfort.  During  hospitalization, the patient was not placed on aspirin as he does have  an allergy causing anaphylaxis.  The patient's Lipitor which he was  taking 20 mg a day was increased to 80 mg daily.  The patient's  metoprolol was discontinued, and he was started on Coreg.  The patient  was on Plavix 75 mg 1 tablet b.i.d. and will remain on that until  followed by Dr. Excell Seltzer in the office.  The patient was seen and examined  by Dr. Excell Seltzer on day of discharge and found to be stable.  The patient  will remain on Plavix 150 mg  daily x30 days and then decreased to 75 mg  daily.  The patient will continue other medications prior to admission  with the exception of changes.  The patient's Coreg will be at 12.5 mg  b.i.d.   DISCHARGE LABORATORIES:  Sodium 141, potassium 3.7, chloride 107, CO2 of  26, BUN 8, creatinine 0.9, and glucose 104.  Hemoglobin 13, hematocrit  38, white blood cells 5.6, and platelets 186.  Cholesterol 157,  triglycerides 152, HDL 22, and LDL 103.  EKG revealing sinus rhythm with  inferior lateral T-wave changes consistent with ischemia.   DISCHARGE MEDICATIONS:  1. Plavix 150 mg p.o. daily x30 days then decrease to 75 mg daily.  2. Coreg 12.5 mg b.i.d.  3. Lisinopril 10 mg twice a day.  4. Lipitor 80 mg daily.   ALLERGIES:  ASPIRIN and PENICILLIN.   FOLLOWUP PLANS AND APPOINTMENT:  1. The patient will follow up with Dr. Tonny Bollman in the office on      May 12, 2008, at 2:30 p.m. and then subsequently be followed      in the [place] office.  2. The patient will follow up with his primary care physician for      continued medical management.  3. The patient has been given post cardiac catheterization      instructions with particular emphasis on the right groin site for      evidence of bleeding hematoma or signs of infection.  4. The patient has been given prescriptions for new medication regimen      and identified the medicine changes.  The patient has been advised      to bring his medications with him to follow up appointment.   TIME SPENT WITH THE PATIENT TO INCLUDE PHYSICIAN TIME:  35 minutes.       Bettey Mare. Lyman Bishop, NP      Veverly Fells. Excell Seltzer, MD  Electronically Signed    KML/MEDQ  D:  04/28/2008  T:  04/29/2008  Job:  [id number]   cc:   [name], M.D.

## 2010-11-12 NOTE — H&P (Signed)
NAMEWILMAR, Corey [name] NO.:  [id number]   MEDICAL RECORD NO.:  [id number]          PATIENT TYPE:  INP   LOCATION:  2926                         FACILITY:  [facility]   PHYSICIAN:  Veverly Fells. Excell Seltzer, MD  DATE OF BIRTH:  [birth year]   DATE OF ADMISSION:  04/24/2008  DATE OF DISCHARGE:                              HISTORY & PHYSICAL   PRIMARY CARE PHYSICIAN:  [name], MD, in [place], [place].   PRIMARY CARDIOLOGIST:  None.   CHIEF COMPLAINT:  Chest pain.   HISTORY OF PRESENT ILLNESS:  Mr. Corey [name] is a 75 year old male with a  previous history of coronary artery disease.  He has not seen a  cardiologist in greater than 10 years, but has a remote history of  getting 3 stents according to the patient.  Today, he was in his usual  state of health and was cooking breakfast when he had sudden onset of  substernal chest pain.  It was described as a pressure.  It did not  radiate.  He got a little short of breath with it.  He had a little bit  of nausea as well.  When his symptoms did not resolve, he went to the  [facility] Emergency Room.  In the emergency room, his pain was ongoing  despite multiple medications and he was transferred urgently by [facility]  to the [facility] Lab.   Mr. Hawker does not report a previous history of chest pain recently.  He stated that the symptoms occurred this morning without significant  activity.  At first, he thought he was hungry, but then because the pain  was up in his chest and step down his stomach.  He became concerned it  was cardiac.  His symptoms were associated with shortness of breath,  diaphoresis, and nausea.  In the emergency room, he received a total of  sublingual nitroglycerin x2, IV nitroglycerin up-titrated to 50 mcg per  minute, metoprolol 5 mg IV, morphine total of 8 mg IV, heparin bolus and  drip as well as 300 mg of Plavix.  Currently, his chest pain is a 4-5/10  and he states it comes  and goes.   PAST MEDICAL HISTORY:  1. Hypertension.  2. Hyperlipidemia.  3. Obesity.  4. History of PTCA and stent x3 per the patient in approximately 1998.   SURGICAL HISTORY:  Cardiac catheterization.   ALLERGIES:  PENICILLIN and ASPIRIN which causes nausea and vomiting.   CURRENT MEDICATIONS:  The patient states he takes approximately  medicines and gets them filled at [facility] in [place].  We will  obtain a list from the pharmacy.   SOCIAL HISTORY:  He lives in [place].  He is a widower.  He is self  care.  He has 2 daughters that live nearby.  He denies a history of  alcohol, tobacco, or drug abuse.   FAMILY HISTORY:  Noncontributory.   REVIEW OF SYSTEMS:  He occasionally has reflux symptoms and GI upset.  He denies fevers or chills.  He has not had any other recent medical  problems or issues.  He denies hematemesis, hemoptysis, or melena.  Full  14-point review of systems is otherwise negative.   PHYSICAL EXAMINATION:  GENERAL:  He is a well-developed elderly African  American male in no acute distress.  HEENT:  Normal.  NECK:  There is no lymphadenopathy, thyromegaly, bruit, or JVD noted.  CARDIOVASCULAR:  Heart is regular in rate and rhythm with an S1 and S2  and no significant murmur, rub, or gallop is noted.  SKIN:  No rashes or lesions are noted.  ABDOMEN:  Soft and nontender with active bowel sounds.  EXTREMITIES:  There is no cyanosis, clubbing, or edema noted.  Distal  pulses are intact in all 4 extremities, but bilateral femoral bruits are  also noted.  MUSCULOSKELETAL:  There is no joint deformity or effusions and no spine  or CVA tenderness.  NEURO:  He is alert and oriented.  Cranial nerves II-XII are grossly  intact   Chest x-ray, no acute disease.   LABORATORY VALUES:  Hemoglobin 13.3, hematocrit 40, WBC 7.1, and  platelets 196.  Point-of-care markers negative x2 except for a mildly  elevated troponin of 0.10.  INR is 1.0.  A BMET is  incomplete at the  time of dictation.   IMPRESSION:  Chest pain:  His symptoms are concerning for unstable  anginal pain/acute coronary syndrome.  He is being taken urgently to the  Cath Lab with further evaluation and treatment depending on the results.  He will be continued on his home medications and the pharmacy will be  contacted to determine these.      Theodore Demark, PA-C      Veverly Fells. Excell Seltzer, MD  Electronically Signed    RB/MEDQ  D:  04/24/2008  T:  04/25/2008  Job:  [id number]

## 2010-11-12 NOTE — Assessment & Plan Note (Signed)
[facility]                       [place] CARDIOLOGY OFFICE NOTE   NAME:[name], [name]                   MRN:          [id number]  DATE:09/15/2008                            DOB:          07/14/1935    PRIMARY CARE PHYSICIAN:  [name], MD.   REASON FOR VISIT:  Routine followup.   HISTORY OF PRESENT ILLNESS:  This is my first meeting with Corey [name]  in [place] [facility].  He was last seen by Dr. Excell Seltzer back in November  2009 in follow up of acute coronary syndrome, requiring bare-metal stent  placement to an occluded right coronary artery and severely diseased  circumflex.  He had moderate left anterior descending disease that was  nonobstructive and he has been treated medically since then.  He states  he just recently graduated from cardiac rehabilitation.  He is not  reporting any anginal chest pain.  He has not had followup lipids or  liver function and remains on Lipitor 80 mg daily.  His ejection  fraction by echocardiography around the time of his event was in the  range of 45-50%.  He had an ASPIRIN allergy and was originally treated  with cilostazol and Plavix, although he is now only on Plavix.  He is  not having any bleeding problems.  Today his electrocardiogram shows  sinus rhythm with a occasional premature atrial complexes and  nonspecific ST-T wave changes, which are actually less pronounced and  his prior tracing in November 2009.   ALLERGIES:  PENICILLIN, ASPIRIN, ORANGE JUICE, and other ACIDIC FOODS.   MEDICATIONS:  1. Lisinopril 10 mg p.o. daily.  2. Coreg 12.5 mg p.o. b.i.d.  3. Plavix 75 mg p.o. daily.  4. Lipitor 80 mg p.o. daily.  5. Vitamin C.  6. Vitamin E.  7. Sublingual nitroglycerin 0.4 mg p.r.n.  8. Tussionex p.r.n.   REVIEW OF SYSTEMS:  Outlined above.  No claudication.  No palpitations  or syncope.  Otherwise negative.   PHYSICAL EXAMINATION:  VITAL SIGNS:  Blood pressure is 143/75,  heart  rate is 58, weight is 225 pounds.  This is an overweight male in no  acute distress.  HEENT:  Conjunctivae are normal.  Oropharynx is clear.  NECK:  Supple.  No elevated jugular venous pressure.  No loud bruits.  No thyromegaly is noted.  LUNGS:  Clear without labored breathing at rest.  CARDIAC:  Reveals a regular rate and rhythm.  Soft S4.  No pathologic or  systolic murmur.  No pericardial rub.  ABDOMEN:  Soft and nontender.  Normoactive bowel sounds.  EXTREMITIES:  Exhibit frank pitting edemas.  Distal pulses are 2+.  SKIN:  Warm and dry.  MUSCULOSKELETAL:  No kyphosis noted.  NEUROPSYCHIATRIC:  The patient is alert and oriented x3.  Affect is  appropriate.   IMPRESSION AND RECOMMENDATIONS:  1. Coronary artery disease status post presentation with acute      coronary syndrome, approximately 6 months ago and now status post      bare-metal stent placement to the right coronary artery and      circumflex with medically managed moderate left anterior  descending      disease and an ejection fraction previously assessed at 45-50%.  He      is not reporting any heart failure symptoms or angina.  He just      completed cardiac rehabilitation.  He had an aspirin allergy and I      underscored the importance of continuing Plavix long-term.  He has      continued to pursue regular exercise at the [facility].  We will schedule      a followup 2-D echocardiogram to reassess left ventricular systolic      function and I will plan to see him back over the next 6 months.  2. Hyperlipidemia, on high-dose Lipitor.  We will plan to follow up      liver function and lipids.  3. Hypertension, not optimally controlled.  We will advance lisinopril      to 20 mg daily and plan a followup BMET.     Jonelle Sidle, MD  Electronically Signed    SGM/MedQ  DD: 09/15/2008  DT: 09/16/2008  Job #: [id number]   cc:   [name], M.D.

## 2010-11-12 NOTE — Assessment & Plan Note (Signed)
[facility]                       [place] CARDIOLOGY OFFICE NOTE   NAME:Dow, NASSIM COSMA                   MRN:          [id number]  DATE:10/23/2008                            DOB:          June 10, 1936    CARDIOLOGIST:  Jonelle Sidle, MD   PRIMARY CARE PHYSICIAN:  [name], MD   REASON FOR VISIT:  One-month followup.   HISTORY OF PRESENT ILLNESS:  Mr. Haberland is a 75 year old male patient  with a history of coronary artery disease status post acute coronary  syndrome in October 2009, who underwent bare-metal stenting to the RCA  as well as the left circumflex.  He was last seen by Dr. Diona Browner September 15, 2008.  He had previously been followed with Dr. Excell Seltzer in the  [place] office post-intervention.  Of note, he has an aspirin allergy  that causes anaphylaxis.  He has been treated since his intervention  with Plavix 75 mg 2 tablets daily.  He recently had lipid panel  performed and his LDL was still suboptimal at 87 and his HDL was 29.  He  was asked to increase his Lipitor to 80 mg a day.  In the office today,  he denies any chest discomfort.  He works out at [employer] about 4-5  times a week without significant symptoms.  He occasionally does get  short of breath, but this seems to be related to increased pollen counts  and is associated with wheezing.  He does note a nonproductive cough at  times with this.  He denies any orthopnea, PND, or significant pedal  edema.  Denies any syncope.   CURRENT MEDICATIONS:  1. Vitamin E.  2. Vitamin C.  3. Lipitor 80 mg daily.  4. Plavix 75 mg 2 tablets daily.  5. Coreg 2.5 mg b.i.d.  6. Lisinopril 20 mg b.i.d.  7. Nitroglycerin p.r.n. chest pain.   ALLERGIES:  ASPIRIN causes anaphylaxis as outlined above, PENICILLIN.   PHYSICAL EXAMINATION:  GENERAL:  This is a well-nourished male in no  acute distress.  VITAL SIGNS:  Blood pressure is 118/72 with a pulse of 62, weight 223  pounds.  HEENT:  Normal.  NECK:  Without JVD.  CARDIAC:  Normal S1, S2.  Regular rate and rhythm.  LUNGS:  Clear to auscultation bilaterally.  ABDOMEN:  Soft, nontender.  EXTREMITIES:  Without edema.  NEUROLOGIC:  He is alert and oriented x3.  Cranial nerves II through XII  grossly intact.   ASSESSMENT AND PLAN:  1. Coronary artery disease status post non-ST-elevation myocardial      infarction October 2009 with bare-metal stents placed to the right      coronary artery as well as the circumflex.  He had prior      intervention done in 1998 as well.  His EF was low at 45-50% at the      time of his cardiac catheterization.  Recent echocardiogram ordered      by Dr. Diona Browner demonstrated somewhat improved ejection fraction of      50-55% with no wall motion abnormalities.  He is having no symptoms  of angina at this time.  He cannot take aspirin.  He will continue      on Plavix.  I am not certain that he is benefiting from taking 150      mg of Plavix a day.  However, since he has been on that dosage      since his intervention, I will not make any further changes at this      time.  I will try to be in touch with Dr. Excell Seltzer who did his      intervention to see if there is any benefit to continuing this      dosage.  If not, we will contact the patient and have him decrease      to 75 mg a day.  He is not having any symptoms of bleeding at this      time.  We will go ahead and check a CBC to make sure he is not      having any occult bleeding.  2. Dyslipidemia.  He will continue on Lipitor and have followup lipids      in approximately 8-12 weeks.  3. Hypertension.  This is well controlled on his current medical      regimen.   DISPOSITION:  The patient will follow up in 6 months or sooner p.r.n.      Tereso Newcomer, PA-C  Electronically Signed      Gerrit Friends. Dietrich Pates, MD, [facility]  Electronically Signed   SW/MedQ  DD: 10/23/2008  DT: 10/24/2008  Job #: [id number]

## 2010-11-12 NOTE — Assessment & Plan Note (Signed)
[facility]                            CARDIOLOGY OFFICE NOTE   NAME:Corey [name], Corey [name]                   MRN:          [id number]  DATE:05/12/2008                            DOB:          [birth year]    REASON FOR VISIT:  Followup hospitalization after acute coronary  syndrome.   HISTORY OF PRESENT ILLNESS:  Corey [name] is a 75-year-  old gentleman who presented with an acute coronary syndrome 2 weeks ago.  He had ongoing chest pain and ischemia and was taken emergently to  cardiac catheterization where he was found to have an occluded right  coronary artery.  He was treated with a bare-metal stent.  He was also  found to have severe left circumflex stenosis and moderate LAD stenosis.  In a staged procedure, he was brought back to the Cath Lab and  fractional flow reserve of the LAD was done which showed no significant  ischemia.  He had a 90% left circumflex lesion and fractional flow  reserve of that vessel showed marked ischemia.  He was treated with  another bare-metal stent.  Bare-metal stents were used because the  patient has a true aspirin allergy and I was concerned about treating  him only with Thienopyridine.  He is currently tolerating Plavix well.  He has had no further chest pain.  He has been recovering at home  without any other problems.  He denies chest pain, edema, or  palpitations.   HOME MEDICATIONS:  1. Metoprolol 50 mg twice daily.  2. Vitamin E one daily.  3. Vitamin C one daily.  4. Lipitor 20 mg daily.  5. Enalapril 20 mg twice daily.  6. Cilostazol 100 mg twice daily.  7. Plavix 75 mg daily.   ALLERGIES:  PENICILLIN and ASPIRIN.   PHYSICAL EXAMINATION:  GENERAL:  The patient is alert and oriented.  He  is in no distress.  VITAL SIGNS:  Weight is 224, blood pressure 134/80, heart rate 61,  respiratory rate 12.  HEENT:  Normal.  NECK:  Normal carotid upstrokes.  No bruits.  JVP normal.  LUNGS:   Clear bilaterally.  HEART:  Regular rate and rhythm.  No murmurs or gallops.  ABDOMEN:  Soft, obese, nontender, no organomegaly.  EXTREMITIES:  No clubbing, cyanosis or edema.  Peripheral pulses are  intact and equal.   EKG shows normal sinus rhythm with LVH and inferolateral T-wave changes  consider ischemia.   ASSESSMENT:  1. Coronary artery disease status post recent non-ST-elevation      myocardial infarction.  Two-vessel percutaneous coronary      intervention was performed with stenting of occluded right coronary      artery and severely diseased left circumflex.  As above, he was      treated with bare-metal stents.  He should continue on Plavix and      cilostazol.  He is aspirin allergic.  Also continue secondary risk      reduction measures as outlined.  I have asked him to go ahead and      begin cardiac rehab.  A referral was made today  for [employer].  He      would like to follow up in our [place] office in the future and      arrangements will be made for a followup visit there in 8 weeks.  2. Hypertension.  Continue current medical regimen.  3. Hyperlipidemia.  Continue Lipitor 20 mg.  Followup lipid and LFTs      at the time of his return office visit in 8 weeks.   Hospital labs show a cholesterol of 157, triglycerides 162, HDL 22, and  LDL 103.   As above, the patient will follow up in [place] with either Dr.  Diona Browner or Dr. Dietrich Pates.  No changes were made to his medical regimen  today.  He was again encouraged to start cardiac rehab.     Veverly Fells. Excell Seltzer, MD  Electronically Signed    MDC/MedQ  DD: 05/12/2008  DT: 05/13/2008  Job #: [id number]   cc:   [name], M.D.

## 2010-11-12 NOTE — Cardiovascular Report (Signed)
Corey [name], Corey [name]            ACCOUNT NO.:  [id number]   MEDICAL RECORD NO.:  [id number]          PATIENT TYPE:  INP   LOCATION:  6526                         FACILITY:  [facility]   PHYSICIAN:  Veverly Fells. Excell Seltzer, MD  DATE OF BIRTH:  1936/03/20   DATE OF PROCEDURE:  04/27/2008  DATE OF DISCHARGE:                            CARDIAC CATHETERIZATION   PROCEDURE:  Fractional flow reserve of the left anterior descending, and  fractional flow reserve, percutaneous transluminal coronary angioplasty,  and stenting of the left circumflex.   INDICATION:  Corey [name] is a 75 year old gentleman who presented with  an acute myocardial infarction earlier this week.  He had total  occlusion of the right coronary artery that was opened and treated with  a bare-metal stent.  He was noted at that time to have severe disease in  the left circumflex and a moderate lesion in the LAD.  I elected to  bring him back for staged PCI.  His LAD lesion is indeterminate in the  range of 50-70% and therefore, pressure wire analysis was used to  determine whether intervention was necessary.   Angiomax was used for anticoagulation.  The patient has and a true  ASPIRIN allergy.  He has been treated with 150 mg of daily clopidogrel.  Access was obtained via the modified Seldinger technique in the right  femoral artery using a front wall puncture.  Angiomax was then started  for anticoagulation and once a therapeutic ACT was achieved, an XB 3.5-  cm 6-French guide catheter was used.  The pressure wire was calibrated  and normalized under normal technique at the end of the guide catheter.  It was then advanced beyond the lesion in the mid LAD and passed down  into the distal LAD.  Graded doses of intracoronary adenosine were  administered at 18, 36, and then 60 mcg.  The FFR reading on all 3 doses  was 0.84, which is not significant.  The wire was then pulled back and  past beyond the left circumflex lesion into the  distal OM branch.  The  resting FFR was 0.95.  Intracoronary adenosine 36 mcg was administered,  and the FFR was 0.74.  This is a significant reading that correlated  with a 90% focal stenosis.  I then elected to predilate the vessel with  a 2.5 x 12 mm apex balloon, which was inflated to 10 atmospheres and was  fairly well expanded.  The balloon was withdrawn and intracoronary  nitroglycerin was given.  The vessel was approximately 3 mm in diameter  and therefore a 3.0 x 12 mm Multi-Link Vision stent was chosen.  It was  carefully positioned and deployed at 14 atmospheres.  Following  stenting, the stent was postdilated with a 3.25 x 12 mm [place] Voyager  balloon, which was taken to 14 atmospheres.  At the completion of the  procedure, there was an excellent angiographic result with 0% residual  stenosis.  FFR was performed again with 36 mcg of intracoronary  adenosine and the poststent FFR was 0.97.  Wire was removed and a final  angiographic image was taken.  The patient tolerated the procedure well.  He had no chest pain at the completion of the procedure and remained  hemodynamically stable.  A femoral arteriogram demonstrated a diseased  femoral artery that was not appropriate for closure.  Angiomax was  discontinued, and the sheath will be pulled in 2 hours.   ASSESSMENT:  1. Successful percutaneous coronary intervention of the left      circumflex using fractional flow reserve guidance.  2. Negative fractional flow reserve of an intermediate lesion in the      left anterior descending.   PLAN:  Continue Plavix 150 mg daily.  Consider change to prasugrel since  the patient cannot take aspirin.      Veverly Fells. Excell Seltzer, MD  Electronically Signed     MDC/MEDQ  D:  04/27/2008  T:  04/27/2008  Job:  [id number]

## 2010-11-12 NOTE — Cardiovascular Report (Signed)
Corey, [name] NO.:  [id number]   MEDICAL RECORD NO.:  [id number]          PATIENT TYPE:  INP   LOCATION:  2926                         FACILITY:  [facility]   PHYSICIAN:  Veverly Fells. Excell Seltzer, MD  DATE OF BIRTH:  1936/06/21   DATE OF PROCEDURE:  04/24/2008  DATE OF DISCHARGE:                            CARDIAC CATHETERIZATION   PROCEDURES:  Left heart catheterization, selective coronary angiography,  left ventricular angiography, percutaneous transluminal coronary  angioplasty and stenting of the right coronary artery.   INDICATIONS:  Corey [name] is a 75 year old gentleman with known CAD.  He underwent prior PCI over 10 years ago.  The details of this were not  well defined at present.  He came in to the [facility] Emergency Room  with an ongoing ischemic event and continued chest pain.  He had a  nondiagnostic EKG, but in the setting of known CAD and persistent chest  pain, we elected to proceed with emergent cardiac catheterization.  The  patient did have lateral ST depression.   Risks and indications of the procedure were reviewed with the patient.  Informed consent was obtained.  The right groin was prepped and draped,  and anesthetized with 1% lidocaine.  Using modified Seldinger technique,  a 6-French sheath was placed in the right femoral artery.  A standard 6-  [place] Judkins catheters were used for coronary angiography and left  ventriculography.  All catheter exchanges were performed over a  guidewire.  The patient tolerated the diagnostic portion of the  procedure well.  Angiographic images demonstrated total occlusion of the  mid-right coronary artery with faint left-to-right collaterals.  The  occlusion had the appearance of an acute occlusion.  I elected to  proceed with ad hoc PCI.  The initial ACT was over 200 as the patient  had received 4000 units of unfractionated heparin.  He had also received  300 mg of Plavix.  An additional 300 mg of  Plavix was given.  Angiomax  was used for anticoagulation.  After the Angiomax bolus was completed, a  6-French JR-4 guide catheter was inserted.  There was moderate pressure  damping with a catheter in place.  A Cougar guidewire was then used to  cross the lesion, which was performed without much difficulty.  The wire  was passed into the distal right coronary artery.  This restored flow.  The lesion was then predilated with a 2.5 x 15 mm apex balloon, which  was inflated to 10 atmospheres.  Following predilatation, there was TIMI  III flow in the vessel.  After flow was restored, the patient became  bradycardic and hypotensive.  He was treated with 1 mg of atropine and  started on a dopamine drip.  His IV nitroglycerin was discontinued, then  he was given wide-open IV fluids.  Once he became more hemodynamically  stable, 3.0 x 18-mm driver stent was carefully positioned and then  deployed at 12 atmospheres.  There was a residual waste in the  midportion on the stent.  There was TIMI III flow on the vessel.  The  lesion appeared to be well covered.  I had initially attempted to  postdilate the stent with a 3.25 x 12-mm Voyager [place] balloon.  However,  guide position was lost when the balloon was advanced out of the guide.  At that point with continued pressure damping in the ostium, I elected  to change guide catheters.  A 6-French 3DRC side-hole guide was then  used.  The vessel was easily wired with the same Cougar guidewire and  balloon was passed down and used to postdilate the stented segment.  Two  inflations to 16 atmospheres were performed.  The patient tolerated the  procedure well.  There were no immediate complications.  A postprocedure  femoral angiogram demonstrated significant stenosis in the common  femoral artery at the entry site.  I elected not to close the vessel  because of that.   FINDINGS:  Aortic pressure 144/82 with mean of 108, left ventricular  pressure  143/15.   Coronary angiography, left mainstem:  There is no significant stenosis.  The left main bifurcates into the LAD and left circumflex.  There is  mild plaque in the distal left main, but again it appears widely patent.   LAD:  The LAD is a large caliber vessel.  It courses down and reaches  the LV apex.  There is mild calcification throughout.  Just at the first  septal perforator and first diagonal, there is a moderate 50% stenosis  of the LAD.  The first septal perforator has a tight ostial stenosis and  the first diagonal branch has a 30-40% proximal stenosis.  The LAD  tapers in size just after the first diagonal.  There are no high-grade  stenosis throughout the LAD or its branch vessels.   Left circumflex:  The left circumflex has a severe proximal stenosis.  There is an intermediate branch that is widely patent.  The proximal  circumflex has a 90% eccentric stenosis just before the first OM branch.  The circumflex then courses down and bifurcates into twin vessels  supplying two more obtuse marginal branches.  There is TIMI III flow on  the left circumflex.   Right coronary artery:  The right coronary artery has moderate ostial  stenosis at 50%.  The midportion of the right coronary artery is totally  occluded.  The distal branches of the right coronary artery fill faintly  from left-to-right collaterals.  The RCA is occluded in the midportion  just beyond the origin of the first RV marginal branch.  There is TIMI 0  flow beyond that region.   Left ventriculography demonstrates a large area of inferior wall  akinesis from the base of the inferior wall all the way to the distal  inferior wall but spares the true apex.  The remaining portions of the  left ventricle are hyperdynamic.  The LVEF is estimated at 45%.  There  is no significant mitral regurgitation.   ASSESSMENT:  1. Total occlusion of the right coronary artery with successful      percutaneous coronary  intervention using a bare-metal stent.  2. Severe left circumflex stenosis.  3. Moderate left anterior descending stenosis.  4. Mild left ventricular dysfunction with inferior wall akinesis, but      overall left ventricular ejection fraction of approximately 45%.   PLAN:  We will continue post-MI medical therapy.  Corey [name] will  likely need staged PCI of the left circumflex.  He has been intolerant  to aspirin in the past, but we will give another trial to 81 mg of  aspirin to see if he can tolerate this.  He should remain on Plavix for  1 year if possible in the setting of his non-ST elevation infarct.      Veverly Fells. Excell Seltzer, MD  Electronically Signed     MDC/MEDQ  D:  04/24/2008  T:  04/25/2008  Job:  [id number]

## 2010-11-13 ENCOUNTER — Encounter: Payer: Self-pay | Admitting: Cardiology

## 2010-11-14 ENCOUNTER — Other Ambulatory Visit: Payer: Self-pay | Admitting: *Deleted

## 2010-11-14 MED ORDER — AMLODIPINE BESYLATE 5 MG PO TABS: 5.0000 mg | ORAL_TABLET | Freq: Every day | ORAL | Status: DC

## 2010-12-18 ENCOUNTER — Other Ambulatory Visit: Payer: Self-pay | Admitting: *Deleted

## 2010-12-18 MED ORDER — CLOPIDOGREL BISULFATE 75 MG PO TABS: 75.0000 mg | ORAL_TABLET | Freq: Every day | ORAL | Status: DC

## 2011-02-23 ENCOUNTER — Other Ambulatory Visit: Payer: Self-pay | Admitting: Cardiology

## 2011-03-31 LAB — DIFFERENTIAL
Basophils Relative: 0
Eosinophils Absolute: 0.4
Eosinophils Relative: 5
Monocytes Relative: 9
Neutrophils Relative %: 25 — ABNORMAL LOW

## 2011-03-31 LAB — BASIC METABOLIC PANEL
BUN: 8
CO2: 26
Chloride: 102
Creatinine, Ser: 0.87
Potassium: 3.3 — ABNORMAL LOW

## 2011-03-31 LAB — POCT CARDIAC MARKERS
CKMB, poc: 5.2
Myoglobin, poc: 101
Troponin i, poc: 0.1 — ABNORMAL HIGH

## 2011-03-31 LAB — CBC
HCT: 40
MCHC: 33.3
MCV: 82.8
Platelets: 196
RBC: 4.83

## 2011-03-31 LAB — PROTIME-INR: INR: 1

## 2011-04-01 LAB — CARDIAC PANEL(CRET KIN+CKTOT+MB+TROPI)
CK, MB: 42.4 — ABNORMAL HIGH
CK, MB: 70.5 — ABNORMAL HIGH
CK, MB: 99.7 — ABNORMAL HIGH
Relative Index: 11.8 — ABNORMAL HIGH
Relative Index: 14 — ABNORMAL HIGH
Total CK: 514 — ABNORMAL HIGH
Total CK: 599 — ABNORMAL HIGH
Troponin I: 13.36
Troponin I: 7.34

## 2011-04-01 LAB — BASIC METABOLIC PANEL
CO2: 26
Calcium: 8.8
Calcium: 9
Creatinine, Ser: 0.91
Creatinine, Ser: 1.06
GFR calc Af Amer: >60
GFR calc Af Amer: >60
GFR calc non Af Amer: >60
GFR calc non Af Amer: >60
GFR calc non Af Amer: >60
Glucose, Bld: 102 — ABNORMAL HIGH
Glucose, Bld: 104 — ABNORMAL HIGH
Potassium: 3.9
Sodium: 137
Sodium: 140
Sodium: 141

## 2011-04-01 LAB — CBC
Hemoglobin: 13
MCHC: 34.3
Platelets: 169
RDW: 15.4
RDW: 15.7 — ABNORMAL HIGH

## 2011-04-01 LAB — POCT I-STAT, CHEM 8
Creatinine, Ser: 0.9
Glucose, Bld: 125 — ABNORMAL HIGH
Hemoglobin: 13.6
Potassium: 4.1

## 2011-04-01 LAB — LIPID PANEL: Cholesterol: 157

## 2011-04-09 ENCOUNTER — Ambulatory Visit (INDEPENDENT_AMBULATORY_CARE_PROVIDER_SITE_OTHER): Payer: 59 | Admitting: Gastroenterology

## 2011-04-09 ENCOUNTER — Encounter: Payer: Self-pay | Admitting: Gastroenterology

## 2011-04-09 VITALS — BP 135/70 | HR 75 | Temp 97.4°F | Ht 68.0 in | Wt 233.8 lb

## 2011-04-09 DIAGNOSIS — Z1211 Encounter for screening for malignant neoplasm of colon: Secondary | ICD-10-CM

## 2011-04-09 NOTE — Patient Instructions (Signed)
Please follow a high fiber diet.  You may take Miralax 1 capful as needed for a bowel movement daily.  We have set you up for a colonoscopy with Dr. Jena Gauss in the near future.

## 2011-04-09 NOTE — Progress Notes (Signed)
Primary Care Physician:  [name], MD Primary Gastroenterologist:  Dr. Jena Gauss  Chief Complaint  Patient presents with  . Colonoscopy    HPI:   Corey [name] presents today for an initial screening colonoscopy. He denies any melena or hematochezia. He reports occasional constipation. He has taken Miralax occasionally. Reports BM sometimes 1-2 times per week. Feels like constipation has worsened over time. Will feel like it's unproductive. Stool not hard. Not completely following a high fiber diet.  Denies any upper GI symptoms such as reflux, nausea or vomiting, or dysphagia. No loss of appetite or weight loss.   Past Medical History  Diagnosis Date  . Coronary atherosclerosis of native coronary artery     BMS to RCA and circumflex 10/09, LVEF 55%  . Anaphylaxis     Aspirin  . Hyperlipidemia   . Hypertension   . Myocardial infarction     1998, NSTEMI 10/09    Past Surgical History  Procedure Date  . Coronary angioplasty with stent placement 1998/2009    x 2    Current Outpatient Prescriptions  Medication Sig Dispense Refill  . Ascorbic Acid (VITAMIN C) 1000 MG tablet Take 1,000 mg by mouth daily.        [name] atorvastatin (LIPITOR) 80 MG tablet Take 80 mg by mouth daily.        . carvedilol (COREG) 12.5 MG tablet TAKE ONE TABLET BY MOUTH TWICE DAILY WITH MEALS  60 tablet  6  . clopidogrel (PLAVIX) 75 MG tablet Take 1 tablet (75 mg total) by mouth daily.  30 tablet  9  . lisinopril (PRINIVIL,ZESTRIL) 20 MG tablet Take 20 mg by mouth 2 (two) times daily.       . nitroGLYCERIN (NITROSTAT) 0.4 MG SL tablet Place 0.4 mg under the tongue every 5 (five) minutes as needed.        . vitamin E 1000 UNIT capsule Take 1,000 Units by mouth daily.          Allergies as of 04/09/2011 - Review Complete 04/09/2011  Allergen Reaction Noted  . Penicillins Nausea And Vomiting 11/05/2010    Family History  Problem Relation Age of Onset  . Hypertension    . Colon cancer Neg Hx      History   Social History  . Marital Status: Widowed    Spouse Name: N/A    Number of Children: N/A  . Years of Education: N/A   Occupational History  . Retired     Midwife   Social History Main Topics  . Smoking status: Former Smoker -- 1.5 packs/day    Types: Cigarettes  . Smokeless tobacco: Former Neurosurgeon    Quit date: 07/31/1996  . Alcohol Use: No  . Drug Use: No  . Sexually Active: Not on file   Other Topics Concern  . Not on file   Social History Narrative  . No narrative on file    Review of Systems: Gen: Denies any fever, chills, fatigue, weight loss, lack of appetite.  CV: Denies chest pain, heart palpitations, peripheral edema, syncope.  Resp: Denies shortness of breath at rest or with exertion. Denies wheezing or cough.  GI: Denies dysphagia or odynophagia. Denies jaundice, hematemesis, fecal incontinence. GU : Denies urinary burning, urinary frequency, urinary hesitancy MS: Denies joint pain, muscle weakness, cramps, or limitation of movement.  Derm: Denies rash, itching, dry skin Psych: Denies depression, anxiety, memory loss, and confusion Heme: Denies bruising, bleeding, and enlarged lymph nodes.  Physical Exam: BP  135/70  Pulse 75  Temp(Src) 97.4 F (36.3 C) (Temporal)  Ht 5\' 8"  (1.727 m)  Wt 233 lb 12.8 oz (106.051 kg)  BMI 35.55 kg/m2 General:   Alert and oriented. Pleasant and cooperative. Well-nourished and well-developed.  Head:  Normocephalic and atraumatic. Eyes:  Without icterus, sclera clear and conjunctiva pink.  Ears:  Normal auditory acuity. Nose:  No deformity, discharge,  or lesions. Mouth:  No deformity or lesions, oral mucosa pink.  Neck:  Supple, without mass or thyromegaly. Lungs:  Clear to auscultation bilaterally. No wheezes, rales, or rhonchi. No distress.  Heart:  S1, S2 present without murmurs appreciated.  Abdomen:  +BS, soft, non-tender and non-distended. No HSM noted. No guarding or rebound. No masses  appreciated.  Rectal:  Deferred  Msk:  Symmetrical without gross deformities. Normal posture. Extremities:  Without clubbing or edema. Neurologic:  Alert and  oriented x4;  grossly normal neurologically. Skin:  Intact without significant lesions or rashes. Cervical Nodes:  No significant cervical adenopathy. Psych:  Alert and cooperative. Normal mood and affect.

## 2011-04-10 DIAGNOSIS — Z1211 Encounter for screening for malignant neoplasm of colon: Secondary | ICD-10-CM | POA: Insufficient documentation

## 2011-04-10 NOTE — Progress Notes (Signed)
Cc to PCP 

## 2011-04-10 NOTE — Assessment & Plan Note (Signed)
75 year old male with no prior colonoscopy, presenting for initial screening. No FH of colorectal cancer. Has no evidence of melena or hematochezia. Reports chronic constipation, seems to be worsening over time. Has taken Miralax intermittently but no real bowel regimen. Not eating high fiber diet.   Proceed with TCS with Dr. Jena Gauss in near future: the risks, benefits, and alternatives have been discussed with the patient in detail. The patient states understanding and desires to proceed. Miralax 1 capful daily as needed for BM High fiber diet handout

## 2011-04-16 ENCOUNTER — Other Ambulatory Visit: Payer: Self-pay | Admitting: Cardiology

## 2011-04-30 MED ORDER — SODIUM CHLORIDE 0.45 % IV SOLN
Freq: Once | INTRAVENOUS | Status: AC
  Administered 2011-05-01: 13:00:00 via INTRAVENOUS

## 2011-05-01 ENCOUNTER — Ambulatory Visit (HOSPITAL_COMMUNITY)
Admission: RE | Admit: 2011-05-01 | Discharge: 2011-05-01 | Disposition: A | Discharge status: Home / Self Care | Payer: 59 | Source: Ambulatory Visit | Attending: Internal Medicine | Admitting: Internal Medicine

## 2011-05-01 ENCOUNTER — Encounter (HOSPITAL_COMMUNITY): Payer: Self-pay | Admitting: *Deleted

## 2011-05-01 ENCOUNTER — Other Ambulatory Visit: Payer: Self-pay | Admitting: Internal Medicine

## 2011-05-01 ENCOUNTER — Encounter (HOSPITAL_COMMUNITY)
Admission: RE | Disposition: A | Discharge status: Home / Self Care | Payer: Self-pay | Source: Ambulatory Visit | Attending: Internal Medicine

## 2011-05-01 DIAGNOSIS — K5730 Diverticulosis of large intestine without perforation or abscess without bleeding: Secondary | ICD-10-CM

## 2011-05-01 DIAGNOSIS — E785 Hyperlipidemia, unspecified: Secondary | ICD-10-CM | POA: Insufficient documentation

## 2011-05-01 DIAGNOSIS — D126 Benign neoplasm of colon, unspecified: Secondary | ICD-10-CM | POA: Insufficient documentation

## 2011-05-01 DIAGNOSIS — Z1211 Encounter for screening for malignant neoplasm of colon: Secondary | ICD-10-CM

## 2011-05-01 DIAGNOSIS — Z79899 Other long term (current) drug therapy: Secondary | ICD-10-CM | POA: Insufficient documentation

## 2011-05-01 DIAGNOSIS — I10 Essential (primary) hypertension: Secondary | ICD-10-CM | POA: Insufficient documentation

## 2011-05-01 HISTORY — PX: COLONOSCOPY: SHX5424

## 2011-05-01 HISTORY — DX: Other seasonal allergic rhinitis: J30.2

## 2011-05-01 SURGERY — COLONOSCOPY
Anesthesia: Moderate Sedation

## 2011-05-01 MED ORDER — MEPERIDINE HCL 100 MG/ML IJ SOLN
INTRAMUSCULAR | Status: AC
  Filled 2011-05-01: qty 1

## 2011-05-01 MED ORDER — STERILE WATER FOR IRRIGATION IR SOLN
Status: DC | PRN
  Administered 2011-05-01: 14:00:00

## 2011-05-01 MED ORDER — MIDAZOLAM HCL 5 MG/5ML IJ SOLN
INTRAMUSCULAR | Status: AC
  Filled 2011-05-01: qty 10

## 2011-05-01 MED ORDER — MIDAZOLAM HCL 5 MG/5ML IJ SOLN
INTRAMUSCULAR | Status: DC | PRN
  Administered 2011-05-01: 2 mg via INTRAVENOUS

## 2011-05-01 MED ORDER — MEPERIDINE HCL 100 MG/ML IJ SOLN
INTRAMUSCULAR | Status: DC | PRN
  Administered 2011-05-01: 50 mg via INTRAVENOUS

## 2011-05-01 NOTE — H&P (Signed)
Corey Halls, NP 04/10/2011 1:52 PM Signed  Primary Care Physician: [name], MD  Primary Gastroenterologist: Dr. Jena Gauss  Chief Complaint   Patient presents with   .  Colonoscopy    HPI:  Corey [name] presents today for an initial screening colonoscopy. He denies any melena or hematochezia. He reports occasional constipation. He has taken Miralax occasionally. Reports BM sometimes 1-2 times per week. Feels like constipation has worsened over time. Will feel like it's unproductive. Stool not hard. Not completely following a high fiber diet.  Denies any upper GI symptoms such as reflux, nausea or vomiting, or dysphagia. No loss of appetite or weight loss.  Past Medical History   Diagnosis  Date   .  Coronary atherosclerosis of native coronary artery      BMS to RCA and circumflex 10/09, LVEF 55%   .  Anaphylaxis      Aspirin   .  Hyperlipidemia    .  Hypertension    .  Myocardial infarction      1998, NSTEMI 10/09    Past Surgical History   Procedure  Date   .  Coronary angioplasty with stent placement  1998/2009     x 2    Current Outpatient Prescriptions   Medication  Sig  Dispense  Refill   .  Ascorbic Acid (VITAMIN C) 1000 MG tablet  Take 1,000 mg by mouth daily.     [name]  atorvastatin (LIPITOR) 80 MG tablet  Take 80 mg by mouth daily.     .  carvedilol (COREG) 12.5 MG tablet  TAKE ONE TABLET BY MOUTH TWICE DAILY WITH MEALS  60 tablet  6   .  clopidogrel (PLAVIX) 75 MG tablet  Take 1 tablet (75 mg total) by mouth daily.  30 tablet  9   .  lisinopril (PRINIVIL,ZESTRIL) 20 MG tablet  Take 20 mg by mouth 2 (two) times daily.     .  nitroGLYCERIN (NITROSTAT) 0.4 MG SL tablet  Place 0.4 mg under the tongue every 5 (five) minutes as needed.     .  vitamin E 1000 UNIT capsule  Take 1,000 Units by mouth daily.      Allergies as of 04/09/2011 - Review Complete 04/09/2011   Allergen  Reaction  Noted   .  Penicillins  Nausea And Vomiting  11/05/2010    Family History   Problem   Relation  Age of Onset   .  Hypertension     .  Colon cancer  Neg Hx     History    Social History   .  Marital Status:  Widowed     Spouse Name:  N/A     Number of Children:  N/A   .  Years of Education:  N/A    Occupational History   .  Retired      Midwife    Social History Main Topics   .  Smoking status:  Former Smoker -- 1.5 packs/day     Types:  Cigarettes   .  Smokeless tobacco:  Former Neurosurgeon     Quit date:  07/31/1996   .  Alcohol Use:  No   .  Drug Use:  No   .  Sexually Active:  Not on file    Other Topics  Concern   .  Not on file    Social History Narrative   .  No narrative on file    Review of Systems:  Gen: Denies  any fever, chills, fatigue, weight loss, lack of appetite.  CV: Denies chest pain, heart palpitations, peripheral edema, syncope.  Resp: Denies shortness of breath at rest or with exertion. Denies wheezing or cough.  GI: Denies dysphagia or odynophagia. Denies jaundice, hematemesis, fecal incontinence.  GU : Denies urinary burning, urinary frequency, urinary hesitancy  MS: Denies joint pain, muscle weakness, cramps, or limitation of movement.  Derm: Denies rash, itching, dry skin  Psych: Denies depression, anxiety, memory loss, and confusion  Heme: Denies bruising, bleeding, and enlarged lymph nodes.  Physical Exam:  BP 135/70  Pulse 75  Temp(Src) 97.4 F (36.3 C) (Temporal)  Ht 5\' 8"  (1.727 m)  Wt 233 lb 12.8 oz (106.051 kg)  BMI 35.55 kg/m2  General: Alert and oriented. Pleasant and cooperative. Well-nourished and well-developed.  Head: Normocephalic and atraumatic.  Eyes: Without icterus, sclera clear and conjunctiva pink.  Ears: Normal auditory acuity.  Nose: No deformity, discharge, or lesions.  Mouth: No deformity or lesions, oral mucosa pink.  Neck: Supple, without mass or thyromegaly.  Lungs: Clear to auscultation bilaterally. No wheezes, rales, or rhonchi. No distress.  Heart: S1, S2 present without murmurs appreciated.   Abdomen: +BS, soft, non-tender and non-distended. No HSM noted. No guarding or rebound. No masses appreciated.  Rectal: Deferred  Msk: Symmetrical without gross deformities. Normal posture.  Extremities: Without clubbing or edema.  Neurologic: Alert and oriented x4; grossly normal neurologically.  Skin: Intact without significant lesions or rashes.  Cervical Nodes: No significant cervical adenopathy.  Psych: Alert and cooperative. Normal mood and affect.   [place] Score 04/10/2011 3:38 PM Signed  Cc to PCP Screening for colon cancer - Corey Halls, NP 04/10/2011 1:52 PM Signed  75 year old male with no prior colonoscopy, presenting for initial screening. No FH of colorectal cancer. Has no evidence of melena or hematochezia. Reports chronic constipation, seems to be worsening over time. Has taken Miralax intermittently but no real bowel regimen. Not eating high fiber diet.  Proceed with TCS with Dr. Jena Gauss in near future: the risks, benefits, and alternatives have been discussed with the patient in detail. The patient states understanding and desires to proceed.  Miralax 1 capful daily as needed for BM  High fiber diet handout  I have seen & examined the patient prior to the procedure(s) today and reviewed the history and physical/consultation.  There have been no changes.  After consideration of the risks, benefits, alternatives and imponderables, the patient has consented to the procedure(s).

## 2011-05-08 ENCOUNTER — Encounter (HOSPITAL_COMMUNITY): Payer: Self-pay | Admitting: Internal Medicine

## 2011-05-14 ENCOUNTER — Encounter: Payer: Self-pay | Admitting: Internal Medicine

## 2011-05-28 ENCOUNTER — Ambulatory Visit: Payer: 59 | Admitting: Cardiology

## 2011-05-29 ENCOUNTER — Encounter: Payer: Self-pay | Admitting: Cardiology

## 2011-05-29 ENCOUNTER — Ambulatory Visit (INDEPENDENT_AMBULATORY_CARE_PROVIDER_SITE_OTHER): Payer: 59 | Admitting: Cardiology

## 2011-05-29 VITALS — BP 121/72 | HR 70 | Ht 68.0 in | Wt 228.0 lb

## 2011-05-29 DIAGNOSIS — I10 Essential (primary) hypertension: Secondary | ICD-10-CM

## 2011-05-29 DIAGNOSIS — E782 Mixed hyperlipidemia: Secondary | ICD-10-CM

## 2011-05-29 DIAGNOSIS — I2510 Atherosclerotic heart disease of native coronary artery without angina pectoris: Secondary | ICD-10-CM

## 2011-05-29 NOTE — Assessment & Plan Note (Signed)
Due for followup fasting lipid profile and liver function tests. 

## 2011-05-29 NOTE — Assessment & Plan Note (Signed)
Symptomatically stable on medical therapy. ECG is reviewed. Plan to continue recommendation for diet and exercise, observation. Warning signs have been discussed. Follow up arranged.

## 2011-05-29 NOTE — Patient Instructions (Addendum)
Your physician recommends that you continue on your current medications as directed. Please refer to the Current Medication list given to you today.  Your physician recommends that you return for lab work in: this week   Your physician recommends that you schedule a follow-up appointment in: 6 months

## 2011-05-29 NOTE — Progress Notes (Signed)
Clinical Summary Corey [name] is a 74 y.o.male presenting for followup. He was seen in May. States that he continues to exercise by walking. Nothing overly strenuous however. He reports compliance with his medications. He states that he has not had any significant angina to require nitroglycerin.  Labwork from May showed cholesterol 150, triglycerides 121, HDL 29, LDL 97, AST 21, and ALT 24.  We discussed diet and exercise, also plan to follow up on lipid studies.   Allergies  Allergen Reactions  . Aspirin Nausea And Vomiting  . Other     Foods that are high in acid content cause sores and a rash.   . Penicillins Nausea And Vomiting    Medication list reviewed.  Past Medical History  Diagnosis Date  . Coronary atherosclerosis of native coronary artery     BMS to RCA and circumflex 10/09, LVEF 55%  . Anaphylaxis     Aspirin  . Hyperlipidemia   . Hypertension   . Myocardial infarction     1998, NSTEMI 10/09  . Shortness of breath   . Seasonal allergies     Past Surgical History  Procedure Date  . Coronary angioplasty with stent placement 1998/2009    x 2  . Colonoscopy 05/01/2011    Procedure: COLONOSCOPY;  Surgeon: Corbin Ade, MD;  Location: [facility];  Service: Endoscopy;  Laterality: N/A;  11:30    Family History  Problem Relation Age of Onset  . Hypertension    . Colon cancer Neg Hx     Social History Corey [name] reports that he has quit smoking. His smoking use included Cigarettes. He has a 67.5 pack-year smoking history. He quit smokeless tobacco use about 14 years ago. Corey [name] reports that he does not drink alcohol.  Review of Systems Stable appetite, no orthopnea or PND. No palpitations or syncope. Otherwise negative.  Physical Examination Filed Vitals:   05/29/11 1417  BP: 121/72  Pulse: 70    Obese male in no acute distress.  HEENT: Conjunctiva and lids normal, oropharynx clear.  Neck: Supple, no elevated jugular venous pressure or  carotid bruits.  Lungs: Clear to auscultation, nonlabored.  Cardiac: Regular rate and rhythm, no S3.  Abdomen: Soft, nontender, no bruits.  Extremities: No pitting edema, distal pulses one plus.  Skin: Warm and dry.  Musculoskeletal: No kyphosis.  Neuropsychiatric: Alert and oriented x3, affect appropriate.   ECG Sinus rhythm with LVH, inferolateral ST depression with T wave inversions. Somewhat more prominent compared to prior tracing.    Problem List and Plan

## 2011-05-29 NOTE — Assessment & Plan Note (Signed)
Blood pressure is well controlled 

## 2011-05-31 LAB — LIPID PANEL
Cholesterol: 138 mg/dL (ref 0–200)
Total CHOL/HDL Ratio: 5.5 Ratio
VLDL: 32 mg/dL (ref 0–40)

## 2011-05-31 LAB — HEPATIC FUNCTION PANEL
AST: 23 U/L (ref 0–37)
Alkaline Phosphatase: 44 U/L (ref 39–117)
Bilirubin, Direct: 0.1 mg/dL (ref 0.0–0.3)
Total Bilirubin: 0.5 mg/dL (ref 0.3–1.2)

## 2011-06-17 ENCOUNTER — Other Ambulatory Visit: Payer: Self-pay | Admitting: Cardiology

## 2011-07-09 ENCOUNTER — Other Ambulatory Visit: Payer: Self-pay | Admitting: Cardiology

## 2011-07-09 MED ORDER — ATORVASTATIN CALCIUM 80 MG PO TABS: 80.0000 mg | ORAL_TABLET | Freq: Every day | ORAL | Status: DC

## 2011-07-09 NOTE — Telephone Encounter (Signed)
PT IS OUT A NEEDS CALLED TODAY/TMJ  THINK CHRISTY IS DOING PLEASE CHECK WITH HER.

## 2011-09-17 ENCOUNTER — Other Ambulatory Visit: Payer: Self-pay | Admitting: *Deleted

## 2011-09-17 ENCOUNTER — Other Ambulatory Visit: Payer: Self-pay | Admitting: Adult Health

## 2011-09-17 MED ORDER — CARVEDILOL 12.5 MG PO TABS: 12.5000 mg | ORAL_TABLET | Freq: Two times a day (BID) | ORAL | Status: DC

## 2011-12-17 ENCOUNTER — Other Ambulatory Visit: Payer: Self-pay | Admitting: *Deleted

## 2011-12-17 MED ORDER — AMLODIPINE BESYLATE 5 MG PO TABS: 5.0000 mg | ORAL_TABLET | Freq: Every day | ORAL | Status: DC

## 2012-02-23 ENCOUNTER — Other Ambulatory Visit: Payer: Self-pay | Admitting: *Deleted

## 2012-02-23 MED ORDER — CARVEDILOL 12.5 MG PO TABS: 12.5000 mg | ORAL_TABLET | Freq: Two times a day (BID) | ORAL | Status: DC

## 2012-04-13 ENCOUNTER — Other Ambulatory Visit: Payer: Self-pay | Admitting: Adult Health

## 2012-04-14 ENCOUNTER — Encounter: Payer: Self-pay | Admitting: Cardiology

## 2012-04-14 ENCOUNTER — Ambulatory Visit (INDEPENDENT_AMBULATORY_CARE_PROVIDER_SITE_OTHER): Payer: 59 | Admitting: Cardiology

## 2012-04-14 VITALS — BP 148/78 | HR 72 | Ht 69.0 in | Wt 227.0 lb

## 2012-04-14 DIAGNOSIS — I10 Essential (primary) hypertension: Secondary | ICD-10-CM

## 2012-04-14 DIAGNOSIS — I2510 Atherosclerotic heart disease of native coronary artery without angina pectoris: Secondary | ICD-10-CM

## 2012-04-14 DIAGNOSIS — E782 Mixed hyperlipidemia: Secondary | ICD-10-CM

## 2012-04-14 NOTE — Patient Instructions (Addendum)
Your physician recommends that you schedule a follow-up appointment in: 1 YEAR  Your physician recommends that you return for lab work in: THIS WEEK

## 2012-04-14 NOTE — Assessment & Plan Note (Signed)
Blood pressure is elevated today. Discussed diet and weight loss. Encouraged more regular aerobic exercise. No medication changes made.

## 2012-04-14 NOTE — Progress Notes (Signed)
Clinical Summary Mr. Stlaurent is a 76 y.o.male presenting for followup. He was last seen in November 2012. He reports no problems with recurring angina or unusual breathlessness. Still plays golf regularly. He states he has been taking his medications without difficulty. Not certain whether he had followup lipids with his primary care visit in the interim. We will request these records.  ECG today shows sinus rhythm with ILBBB and repolarization abnormalities.   Allergies  Allergen Reactions  . Aspirin Nausea And Vomiting  . Other     Foods that are high in acid content cause sores and a rash.   . Penicillins Nausea And Vomiting    Current Outpatient Prescriptions  Medication Sig Dispense Refill  . amLODipine (NORVASC) 5 MG tablet Take 1 tablet (5 mg total) by mouth daily.  30 tablet  6  . Ascorbic Acid (VITAMIN C) 1000 MG tablet Take 1,000 mg by mouth daily.        [name] atorvastatin (LIPITOR) 80 MG tablet Take 1 tablet (80 mg total) by mouth daily.  90 tablet  3  . carvedilol (COREG) 12.5 MG tablet Take 1 tablet (12.5 mg total) by mouth 2 (two) times daily.  60 tablet  6  . clopidogrel (PLAVIX) 75 MG tablet TAKE ONE TABLET BY MOUTH EVERY DAY  30 tablet  5  . lisinopril (PRINIVIL,ZESTRIL) 20 MG tablet Take 20 mg by mouth 2 (two) times daily.        . nitroGLYCERIN (NITROSTAT) 0.4 MG SL tablet Place 0.4 mg under the tongue every 5 (five) minutes as needed.        . vitamin E 1000 UNIT capsule Take 1,000 Units by mouth daily.          Past Medical History  Diagnosis Date  . Coronary atherosclerosis of native coronary artery     BMS to RCA and circumflex 10/09, LVEF 55%  . Anaphylaxis     Aspirin  . Hyperlipidemia   . Hypertension   . Myocardial infarction     1998, NSTEMI 10/09  . Seasonal allergies     Social History Mr. Sweeny reports that he has quit smoking. His smoking use included Cigarettes. He has a 67.5 pack-year smoking history. He quit smokeless tobacco use about  15 years ago. Mr. Hickam reports that he does not drink alcohol.  Review of Systems He reports recent chest cold. No fevers or chills. No palpitations or syncope. No bleeding episodes. Otherwise negative.  Physical Examination Filed Vitals:   04/14/12 1312  BP: 148/78  Pulse: 72   Filed Weights   04/14/12 1312  Weight: 227 lb (102.967 kg)   HEENT: Conjunctiva and lids normal, oropharynx clear.  Neck: Supple, no elevated jugular venous pressure or carotid bruits.  Lungs: Clear to auscultation, nonlabored.  Cardiac: Regular rate and rhythm, no S3.  Abdomen: Soft, nontender, no bruits.  Extremities: No pitting edema, distal pulses one plus.  Skin: Warm and dry.  Musculoskeletal: No kyphosis.  Neuropsychiatric: Alert and oriented x3, affect appropriate.    Problem List and Plan   CORONARY ATHEROSCLEROSIS NATIVE CORONARY ARTERY Symptomatically stable on medical therapy. ECG is also stable. Plan to continue observation at this point. Refills were recently provided.  HYPERLIPIDEMIA If lipids were not obtained by primary care in the interim, these will be arranged. He continues on Lipitor.  HYPERTENSION Blood pressure is elevated today. Discussed diet and weight loss. Encouraged more regular aerobic exercise. No medication changes made.    Jonelle Sidle,  M.D., F.A.C.C.

## 2012-04-14 NOTE — Assessment & Plan Note (Signed)
If lipids were not obtained by primary care in the interim, these will be arranged. He continues on Lipitor.

## 2012-04-14 NOTE — Assessment & Plan Note (Signed)
Symptomatically stable on medical therapy. ECG is also stable. Plan to continue observation at this point. Refills were recently provided.

## 2012-04-15 LAB — HEPATIC FUNCTION PANEL
ALT: 24 U/L (ref 0–53)
Albumin: 4.3 g/dL (ref 3.5–5.2)
Bilirubin, Direct: 0.2 mg/dL (ref 0.0–0.3)

## 2012-04-15 LAB — LIPID PANEL
Cholesterol: 133 mg/dL (ref 0–200)
Triglycerides: 84 mg/dL (ref <150)
VLDL: 17 mg/dL (ref 0–40)

## 2012-04-16 ENCOUNTER — Other Ambulatory Visit: Payer: Self-pay | Admitting: Cardiology

## 2012-05-26 ENCOUNTER — Other Ambulatory Visit: Payer: Self-pay | Admitting: Cardiology

## 2012-05-26 NOTE — Telephone Encounter (Signed)
rx sent to pharmacy by e-script  

## 2012-06-18 ENCOUNTER — Telehealth: Payer: Self-pay | Admitting: Cardiology

## 2012-06-18 MED ORDER — AMLODIPINE BESYLATE 5 MG PO TABS: 5.0000 mg | ORAL_TABLET | Freq: Every day | ORAL | Status: DC

## 2012-06-18 NOTE — Telephone Encounter (Signed)
Called pt to advise this medication is not listed on her medication list, nor is it located in his history of medications, pt stated he will bring his bottles up to the office in a few minutes to show which one he needs, will address upon arrival

## 2012-06-18 NOTE — Telephone Encounter (Signed)
PT NEEDS METOPROLOL CALLED IN FOR 90 DAYS

## 2012-06-18 NOTE — Telephone Encounter (Signed)
Pt brought his amlodipine in for this nurse to view, advised this medication is on his list and we will send that in today, sent via escribe for 90 day supply, pt understood all instructions

## 2012-08-19 ENCOUNTER — Other Ambulatory Visit: Payer: Self-pay | Admitting: Emergency Medicine

## 2012-08-19 MED ORDER — CARVEDILOL 12.5 MG PO TABS: 12.5000 mg | ORAL_TABLET | Freq: Two times a day (BID) | ORAL | Status: DC

## 2012-09-16 ENCOUNTER — Telehealth: Payer: Self-pay | Admitting: *Deleted

## 2012-09-16 MED ORDER — CLOPIDOGREL BISULFATE 75 MG PO TABS: 75.0000 mg | ORAL_TABLET | Freq: Every day | ORAL | Status: DC

## 2012-09-16 NOTE — Telephone Encounter (Signed)
Noted incoming fax for pt plavix, sent via escribe til pt next f/u with MD SM

## 2012-11-19 ENCOUNTER — Telehealth: Payer: Self-pay | Admitting: *Deleted

## 2012-11-19 MED ORDER — AMLODIPINE BESYLATE 5 MG PO TABS: 5.0000 mg | ORAL_TABLET | Freq: Every day | ORAL | Status: DC

## 2012-11-19 NOTE — Telephone Encounter (Signed)
Incoming fax of patient medication refill request noted per patient pharmacy. For 90 day supply of norvasc 5mg  rx sent to pharmacy by e-script

## 2013-03-01 ENCOUNTER — Other Ambulatory Visit: Payer: Self-pay

## 2013-03-01 MED ORDER — AMLODIPINE BESYLATE 5 MG PO TABS: 5.0000 mg | ORAL_TABLET | Freq: Every day | ORAL | Status: DC

## 2013-03-01 NOTE — Telephone Encounter (Signed)
amLODipine (NORVASC) 5 MG tablet  Take 1 tablet (5 mg total) by mouth daily.   30 tablet   6   Jonelle Sidle, MD at 04/14/2012  1:37 PM

## 2013-03-18 ENCOUNTER — Other Ambulatory Visit: Payer: Self-pay

## 2013-03-18 MED ORDER — CARVEDILOL 12.5 MG PO TABS: 12.5000 mg | ORAL_TABLET | Freq: Two times a day (BID) | ORAL | Status: DC

## 2013-03-18 MED ORDER — LISINOPRIL 20 MG PO TABS: ORAL_TABLET | ORAL | Status: DC

## 2013-03-23 ENCOUNTER — Other Ambulatory Visit: Payer: Self-pay | Admitting: *Deleted

## 2013-03-23 MED ORDER — LISINOPRIL 20 MG PO TABS: ORAL_TABLET | ORAL | Status: DC

## 2013-03-24 ENCOUNTER — Other Ambulatory Visit: Payer: Self-pay | Admitting: *Deleted

## 2013-03-24 MED ORDER — CLOPIDOGREL BISULFATE 75 MG PO TABS: 75.0000 mg | ORAL_TABLET | Freq: Every day | ORAL | Status: DC

## 2013-04-20 ENCOUNTER — Ambulatory Visit (INDEPENDENT_AMBULATORY_CARE_PROVIDER_SITE_OTHER): Payer: 59 | Admitting: Cardiology

## 2013-04-20 ENCOUNTER — Encounter: Payer: Self-pay | Admitting: Cardiology

## 2013-04-20 VITALS — BP 122/64 | HR 62 | Ht 69.0 in | Wt 232.0 lb

## 2013-04-20 DIAGNOSIS — I10 Essential (primary) hypertension: Secondary | ICD-10-CM

## 2013-04-20 DIAGNOSIS — I2510 Atherosclerotic heart disease of native coronary artery without angina pectoris: Secondary | ICD-10-CM

## 2013-04-20 DIAGNOSIS — E782 Mixed hyperlipidemia: Secondary | ICD-10-CM

## 2013-04-20 NOTE — Patient Instructions (Addendum)
Your physician recommends that you schedule a follow-up appointment in: ONE YEAR  Your physician recommends that you return for lab work in: THIS WEEK (FASTING LIPID PANEL AND LFT'S)

## 2013-04-20 NOTE — Progress Notes (Signed)
Clinical Summary Corey [name] is a 77 y.o.male last seen in October 2013. He states that he has had no angina symptoms, no change in stamina. He continues to exercise at the gym regularly, walking the treadmill for a least 30 minutes, then doing a stationary bicycle, then arm exercises.  Lab work from last year showed total cholesterol 133, triglycerides 84, HDL 35, LDL 81, normal LFTs. He has not had a followup lipid panel as yet.  ECG shows sinus rhythm with PACs, IVCD consistent with incomplete left lower branch block, significant repolarization abnormalities.   Allergies  Allergen Reactions  . Aspirin Nausea And Vomiting  . Other     Foods that are high in acid content cause sores and a rash.   . Penicillins Nausea And Vomiting    Current Outpatient Prescriptions  Medication Sig Dispense Refill  . Ascorbic Acid (VITAMIN C) 1000 MG tablet Take 1,000 mg by mouth daily.        [name] atorvastatin (LIPITOR) 80 MG tablet TAKE ONE TABLET BY MOUTH EVERY DAY  90 tablet  3  . carvedilol (COREG) 12.5 MG tablet Take 1 tablet (12.5 mg total) by mouth 2 (two) times daily.  180 tablet  0  . clopidogrel (PLAVIX) 75 MG tablet Take 1 tablet (75 mg total) by mouth daily.  30 tablet  3  . lisinopril (PRINIVIL,ZESTRIL) 20 MG tablet TAKE ONE TABLET BY MOUTH TWICE DAILY  60 tablet  1  . nitroGLYCERIN (NITROSTAT) 0.4 MG SL tablet Place 0.4 mg under the tongue every 5 (five) minutes as needed.        . vitamin E 1000 UNIT capsule Take 1,000 Units by mouth daily.        [name] amLODipine (NORVASC) 5 MG tablet Take 1 tablet (5 mg total) by mouth daily.  90 tablet  0   No current facility-administered medications for this visit.    Past Medical History  Diagnosis Date  . Coronary atherosclerosis of native coronary artery     BMS to RCA and circumflex 10/09, LVEF 55%  . Anaphylaxis     Aspirin  . Hyperlipidemia   . Hypertension   . Myocardial infarction     1998, NSTEMI 10/09  . Seasonal allergies      Social History Corey [name] reports that he has quit smoking. His smoking use included Cigarettes. He has a 67.5 pack-year smoking history. He quit smokeless tobacco use about 16 years ago. Corey [name] reports that he does not drink alcohol.  Review of Systems Head no palpitations or dizziness. No reported bleeding problems. Stable appetite. No claudication. Otherwise negative.  Physical Examination Filed Vitals:   04/20/13 1113  BP: 122/64  Pulse: 62   Filed Weights   04/20/13 1113  Weight: 232 lb (105.235 kg)   Comfortable at rest. HEENT: Conjunctiva and lids normal, oropharynx clear.  Neck: Supple, no elevated jugular venous pressure or carotid bruits.  Lungs: Clear to auscultation, nonlabored.  Cardiac: Regular rate and rhythm, no S3.  Abdomen: Soft, nontender, no bruits.  Extremities: No pitting edema, distal pulses one plus.  Skin: Warm and dry.  Musculoskeletal: No kyphosis.  Neuropsychiatric: Alert and oriented x3, affect appropriate.   Problem List and Plan   CORONARY ATHEROSCLEROSIS NATIVE CORONARY ARTERY Symptomatically stable, ECG reviewed. Prior interventions to the RCA and circumflex were in 2009. No recent ischemic testing noted, however he has been asymptomatic and participates in regular exercise. We discussed continued observation for now, however would arrange followup  cardiac testing if he notices any change in stamina or angina symptoms. Followup arranged.  HYPERLIPIDEMIA Will arrange FLP and LFT. He continues on Lipitor.  HYPERTENSION Blood pressure is well-controlled.    Jonelle Sidle, M.D., F.A.C.C.

## 2013-04-20 NOTE — Assessment & Plan Note (Signed)
Will arrange FLP and LFT. He continues on Lipitor.

## 2013-04-20 NOTE — Assessment & Plan Note (Signed)
Symptomatically stable, ECG reviewed. Prior interventions to the RCA and circumflex were in 2009. No recent ischemic testing noted, however he has been asymptomatic and participates in regular exercise. We discussed continued observation for now, however would arrange followup cardiac testing if he notices any change in stamina or angina symptoms. Followup arranged.

## 2013-04-20 NOTE — Assessment & Plan Note (Signed)
Blood pressure is well controlled 

## 2013-04-22 ENCOUNTER — Telehealth: Payer: Self-pay | Admitting: Cardiology

## 2013-04-22 LAB — LIPID PANEL
HDL: 30 mg/dL — ABNORMAL LOW (ref >39)
LDL Cholesterol: 77 mg/dL (ref 0–99)
Total CHOL/HDL Ratio: 4.3 Ratio
VLDL: 23 mg/dL (ref 0–40)

## 2013-04-22 LAB — HEPATIC FUNCTION PANEL
Albumin: 4.2 g/dL (ref 3.5–5.2)
Total Bilirubin: 0.5 mg/dL (ref 0.3–1.2)

## 2013-04-22 MED ORDER — LISINOPRIL 20 MG PO TABS: ORAL_TABLET | ORAL | Status: DC

## 2013-04-22 NOTE — Telephone Encounter (Signed)
Received fax refill request  Rx # [id number] Medication:  Lisinopril 20mg  tab Qty 60 Sig:  Take one tablet by mouth twice daily Physician:  Diona Browner

## 2013-04-22 NOTE — Telephone Encounter (Signed)
rx sent to pharmacy by e-script  

## 2013-04-26 ENCOUNTER — Encounter: Payer: Self-pay | Admitting: *Deleted

## 2013-04-26 ENCOUNTER — Other Ambulatory Visit: Payer: Self-pay

## 2013-04-26 MED ORDER — AMLODIPINE BESYLATE 5 MG PO TABS: 5.0000 mg | ORAL_TABLET | Freq: Every day | ORAL | Status: DC

## 2013-05-21 ENCOUNTER — Other Ambulatory Visit: Payer: Self-pay | Admitting: Cardiology

## 2013-08-17 ENCOUNTER — Telehealth: Payer: Self-pay

## 2013-08-17 MED ORDER — CLOPIDOGREL BISULFATE 75 MG PO TABS: 75.0000 mg | ORAL_TABLET | Freq: Every day | ORAL | Status: DC

## 2013-08-17 NOTE — Telephone Encounter (Signed)
Received fax refill request  Rx # [id number] Medication:  Clopidogrel 75 mg tab Qty 30 Sig:  Take one tablet by mouth once daily Physician:  mcdowell

## 2013-08-17 NOTE — Telephone Encounter (Signed)
Medication sent via escribe.  

## 2013-11-16 ENCOUNTER — Telehealth: Payer: Self-pay | Admitting: Cardiology

## 2013-11-16 MED ORDER — LISINOPRIL 20 MG PO TABS: ORAL_TABLET | ORAL | Status: DC

## 2013-11-16 NOTE — Telephone Encounter (Signed)
Received fax refill request  Rx # [id number] Medication:  Lisinopril 20 mg tab Qty 60 Sig:  Take one tablet by mouth twice daily Physician:  Domenic Polite

## 2013-11-16 NOTE — Telephone Encounter (Signed)
Refill complete 

## 2013-12-16 ENCOUNTER — Other Ambulatory Visit: Payer: Self-pay | Admitting: Cardiology

## 2014-04-22 ENCOUNTER — Other Ambulatory Visit: Payer: Self-pay | Admitting: Cardiology

## 2014-04-24 ENCOUNTER — Telehealth: Payer: Self-pay | Admitting: Cardiology

## 2014-04-24 MED ORDER — LISINOPRIL 20 MG PO TABS: ORAL_TABLET | ORAL | Status: DC

## 2014-04-24 NOTE — Telephone Encounter (Signed)
Received fax refill request  Rx # [id number] Medication:  Lisinopril 20 mg tab Qty 180 Sig:  Take one tablet by mouth twice dailly Physician:  Domenic Polite

## 2014-04-27 ENCOUNTER — Telehealth: Payer: Self-pay | Admitting: *Deleted

## 2014-04-27 NOTE — Telephone Encounter (Signed)
Pt stopped by office, he states that [facility] has not received any refills from [place] on all his medications.

## 2014-04-27 NOTE — Telephone Encounter (Signed)
Spoke with pharmacist at [facility] confirmed refills on lisinopril. Will make pt aware. Phone not in order.

## 2014-04-29 ENCOUNTER — Other Ambulatory Visit: Payer: Self-pay

## 2014-04-29 MED ORDER — AMLODIPINE BESYLATE 5 MG PO TABS: 5.0000 mg | ORAL_TABLET | Freq: Every day | ORAL | Status: DC

## 2014-05-04 ENCOUNTER — Ambulatory Visit (INDEPENDENT_AMBULATORY_CARE_PROVIDER_SITE_OTHER): Payer: 59 | Admitting: Cardiovascular Disease

## 2014-05-04 ENCOUNTER — Encounter: Payer: Self-pay | Admitting: Cardiovascular Disease

## 2014-05-04 VITALS — BP 132/70 | HR 78 | Ht 68.0 in

## 2014-05-04 DIAGNOSIS — I10 Essential (primary) hypertension: Secondary | ICD-10-CM

## 2014-05-04 DIAGNOSIS — R9431 Abnormal electrocardiogram [ECG] [EKG]: Secondary | ICD-10-CM

## 2014-05-04 DIAGNOSIS — I25118 Atherosclerotic heart disease of native coronary artery with other forms of angina pectoris: Secondary | ICD-10-CM

## 2014-05-04 DIAGNOSIS — Z955 Presence of coronary angioplasty implant and graft: Secondary | ICD-10-CM

## 2014-05-04 DIAGNOSIS — R0602 Shortness of breath: Secondary | ICD-10-CM

## 2014-05-04 DIAGNOSIS — R079 Chest pain, unspecified: Secondary | ICD-10-CM

## 2014-05-04 DIAGNOSIS — E782 Mixed hyperlipidemia: Secondary | ICD-10-CM

## 2014-05-04 MED ORDER — ISOSORBIDE MONONITRATE ER 30 MG PO TB24: 30.0000 mg | ORAL_TABLET | Freq: Every day | ORAL | Status: DC

## 2014-05-04 NOTE — Patient Instructions (Addendum)
Your physician recommends that you schedule a follow-up appointment in:  With Dr.McDowell after tests      Your physician has recommended you make the following change in your medication:      START Imdur 30 mg daily    Your physician has requested that you have a lexiscan myoview. For further information please visit [email or web address]. Please follow instruction sheet, as given.         Thank you for choosing [facility] !

## 2014-05-04 NOTE — Progress Notes (Signed)
Patient ID: Corey [name], male   DOB: 08/14/1935, 78 y.o.   MRN: [id number]      SUBJECTIVE: The patient is a 78 year old male who normally sees Dr. Domenic Polite. He has a history of coronary artery disease with prior interventions to the right coronary artery and left circumflex coronary artery in 2009. He also has a history of hypertension and hyperlipidemia.  He normally exercises at the [facility] daily, but for the past 2 or 3 months, he has scaled back on his activity levels. He has been experiencing central back pain associated with some shortness of breath and is uncertain if he has chest discomfort. He has a slope in his backyard and when he walks uphill, he occasionally has more shortness of breath than usual. He has had some left arm pain but this appears to be exacerbated with movement and he is uncertain if it is associated with his back and chest discomfort. He believes he may have had his cholesterol checked earlier this year by his primary care physician. He denies orthopnea, leg swelling, lightheadedness, dizziness, syncope, palpitations, and paroxysmal nocturnal dyspnea. ECG performed in the office today demonstrates normal sinus rhythm with left ventricular hypertrophy and consequent repolarization abnormalities with a nonspecific intraventricular conduction delay, unchanged from an ECG performed on 04/20/2013.   Review of Systems: As per "subjective", otherwise negative.  Allergies  Allergen Reactions  . Aspirin Nausea And Vomiting  . Other     Foods that are high in acid content cause sores and a rash.   . Penicillins Nausea And Vomiting    Current Outpatient Prescriptions  Medication Sig Dispense Refill  . Ascorbic Acid (VITAMIN C) 1000 MG tablet Take 1,000 mg by mouth daily.      [name] atorvastatin (LIPITOR) 80 MG tablet TAKE ONE TABLET BY MOUTH ONCE DAILY 30 tablet 0  . carvedilol (COREG) 12.5 MG tablet TAKE ONE TABLET BY MOUTH TWICE DAILY 60 tablet 0  . clopidogrel (PLAVIX)  75 MG tablet TAKE ONE TABLET BY MOUTH ONCE DAILY 90 tablet 3  . lisinopril (PRINIVIL,ZESTRIL) 20 MG tablet TAKE ONE TABLET BY MOUTH TWICE DAILY 60 tablet 1  . nitroGLYCERIN (NITROSTAT) 0.4 MG SL tablet Place 0.4 mg under the tongue every 5 (five) minutes as needed.      . vitamin E 1000 UNIT capsule Take 1,000 Units by mouth daily.       No current facility-administered medications for this visit.    Past Medical History  Diagnosis Date  . Coronary atherosclerosis of native coronary artery     BMS to RCA and circumflex 10/09, LVEF 55%  . Anaphylaxis     Aspirin  . Hyperlipidemia   . Hypertension   . Myocardial infarction     1998, NSTEMI 10/09  . Seasonal allergies     Past Surgical History  Procedure Laterality Date  . Coronary angioplasty with stent placement  1998/2009    x 2  . Colonoscopy  05/01/2011    Procedure: COLONOSCOPY;  Surgeon: Daneil Dolin, MD;  Location: [facility];  Service: Endoscopy;  Laterality: N/A;  11:30    History   Social History  . Marital Status: Widowed    Spouse Name: N/A    Number of Children: N/A  . Years of Education: N/A   Occupational History  . Retired     Quarry manager   Social History Main Topics  . Smoking status: Former Smoker -- 1.50 packs/day for 45 years    Types: Cigarettes  .  Smokeless tobacco: Former Systems developer    Quit date: 07/31/1996  . Alcohol Use: No  . Drug Use: No  . Sexual Activity: Not on file   Other Topics Concern  . Not on file   Social History Narrative     Filed Vitals:   05/04/14 0848  BP: 132/70  Pulse: 78  Height: 5\' 8"  (1.727 m)    PHYSICAL EXAM General: NAD HEENT: Normal. Neck: No JVD, no thyromegaly. Lungs: Clear to auscultation bilaterally with normal respiratory effort. CV: Nondisplaced PMI.  Regular rate and rhythm, normal S1/S2, no S3/S4, no murmur. No pretibial or periankle edema.  No carotid bruit.  Normal pedal pulses.  Abdomen: Soft, nontender, no hepatosplenomegaly, no  distention.  Neurologic: Alert and oriented x 3.  Psych: Normal affect. Skin: Normal. Musculoskeletal: Normal range of motion, no gross deformities. Extremities: No clubbing or cyanosis.   ECG: Most recent ECG reviewed.      ASSESSMENT AND PLAN: 1. Chest pain/back pain and shortness of breath in the context of CAD with prior PCI: Given his symptoms and reduced activity levels, I am concerned that this is indicative of accelerating angina. I will arrange for a Lexiscan Cardiolite stress test. I will also start Imdur 30 mg daily. He will remain on Lipitor 80 mg, Coreg 12.5 mg twice daily, and Plavix 75 mg daily. 2. Hyperlipidemia: Will try and obtain copy of lipids from PCP's office. For the time being, continue Lipitor 80 mg. 3. Essential HTN: Well-controlled on current therapy which includes carvedilol and lisinopril. No changes to therapy.  Dispo: f/u with Dr. Domenic Polite in 2-3 weeks.  Kate Sable, M.D., F.A.C.C.

## 2014-05-05 ENCOUNTER — Encounter: Payer: Self-pay | Admitting: Cardiovascular Disease

## 2014-05-12 ENCOUNTER — Encounter (HOSPITAL_COMMUNITY): Payer: Self-pay

## 2014-05-12 ENCOUNTER — Encounter (HOSPITAL_COMMUNITY)
Admission: RE | Admit: 2014-05-12 | Discharge: 2014-05-12 | Disposition: A | Discharge status: Home / Self Care | Payer: Medicare Other | Source: Ambulatory Visit | Attending: Cardiovascular Disease | Admitting: Cardiovascular Disease

## 2014-05-12 ENCOUNTER — Ambulatory Visit (HOSPITAL_COMMUNITY)
Admission: RE | Admit: 2014-05-12 | Discharge: 2014-05-12 | Disposition: A | Discharge status: Home / Self Care | Payer: Medicare Other | Source: Ambulatory Visit | Attending: Cardiovascular Disease | Admitting: Cardiovascular Disease

## 2014-05-12 ENCOUNTER — Encounter (HOSPITAL_COMMUNITY)
Admission: RE | Admit: 2014-05-12 | Discharge: 2014-05-12 | Disposition: A | Discharge status: Home / Self Care | Payer: Medicare Other | Source: Ambulatory Visit | Attending: Cardiology | Admitting: Cardiology

## 2014-05-12 DIAGNOSIS — Z955 Presence of coronary angioplasty implant and graft: Secondary | ICD-10-CM | POA: Diagnosis not present

## 2014-05-12 DIAGNOSIS — R0602 Shortness of breath: Secondary | ICD-10-CM | POA: Diagnosis not present

## 2014-05-12 DIAGNOSIS — I10 Essential (primary) hypertension: Secondary | ICD-10-CM | POA: Insufficient documentation

## 2014-05-12 DIAGNOSIS — E785 Hyperlipidemia, unspecified: Secondary | ICD-10-CM | POA: Diagnosis not present

## 2014-05-12 DIAGNOSIS — I2510 Atherosclerotic heart disease of native coronary artery without angina pectoris: Secondary | ICD-10-CM | POA: Insufficient documentation

## 2014-05-12 DIAGNOSIS — R079 Chest pain, unspecified: Secondary | ICD-10-CM | POA: Diagnosis present

## 2014-05-12 DIAGNOSIS — I252 Old myocardial infarction: Secondary | ICD-10-CM | POA: Diagnosis not present

## 2014-05-12 DIAGNOSIS — I259 Chronic ischemic heart disease, unspecified: Secondary | ICD-10-CM | POA: Insufficient documentation

## 2014-05-12 DIAGNOSIS — R9431 Abnormal electrocardiogram [ECG] [EKG]: Secondary | ICD-10-CM

## 2014-05-12 MED ORDER — REGADENOSON 0.4 MG/5ML IV SOLN
0.4000 mg | Freq: Once | INTRAVENOUS | Status: AC | PRN
  Administered 2014-05-12: 0.4 mg via INTRAVENOUS

## 2014-05-12 MED ORDER — TECHNETIUM TC 99M SESTAMIBI GENERIC - CARDIOLITE
30.0000 | Freq: Once | INTRAVENOUS | Status: AC | PRN
  Administered 2014-05-12: 30 via INTRAVENOUS

## 2014-05-12 MED ORDER — SODIUM CHLORIDE 0.9 % IJ SOLN
INTRAMUSCULAR | Status: AC
  Administered 2014-05-12: 10 mL via INTRAVENOUS
  Filled 2014-05-12: qty 10

## 2014-05-12 MED ORDER — REGADENOSON 0.4 MG/5ML IV SOLN
INTRAVENOUS | Status: AC
  Administered 2014-05-12: 0.4 mg via INTRAVENOUS
  Filled 2014-05-12: qty 5

## 2014-05-12 MED ORDER — SODIUM CHLORIDE 0.9 % IJ SOLN
10.0000 mL | INTRAMUSCULAR | Status: DC | PRN
  Administered 2014-05-12: 10 mL via INTRAVENOUS
  Filled 2014-05-12: qty 10

## 2014-05-12 MED ORDER — TECHNETIUM TC 99M SESTAMIBI - CARDIOLITE
10.0000 | Freq: Once | INTRAVENOUS | Status: AC | PRN
  Administered 2014-05-12: 09:00:00 10 via INTRAVENOUS

## 2014-05-12 NOTE — Progress Notes (Signed)
Stress Lab Nurses Notes - Corey [name] 05/12/2014 Reason for doing test: CAD and sob & back pain Type of test: Wille Glaser Nurse performing test: Gerrit Halls, RN Nuclear Medicine Tech: Melburn Hake Echo Tech: Not Applicable MD performing test: S. [name]/K.LawrenceNP   Family MD: McGough Test explained and consent signed: Yes.   IV started: Saline lock flushed, No redness or edema and Saline lock started in radiology Symptoms: SOB Treatment/Intervention: None Reason test stopped: protocol completed After recovery IV was: Discontinued via X-ray tech and No redness or edema Patient to return to Nuc. Med at : 11:30 Patient discharged: Home Patient's Condition upon discharge was: stable Comments: During test BP 162/83 & HR 88.  Recovery BP 148/84 & HR 77.  Symptoms resolved in recovery [name] T

## 2014-05-15 ENCOUNTER — Ambulatory Visit (HOSPITAL_COMMUNITY): Payer: 59

## 2014-05-15 ENCOUNTER — Encounter (HOSPITAL_COMMUNITY): Payer: 59

## 2014-05-16 ENCOUNTER — Ambulatory Visit (INDEPENDENT_AMBULATORY_CARE_PROVIDER_SITE_OTHER): Payer: 59 | Admitting: Cardiology

## 2014-05-16 ENCOUNTER — Ambulatory Visit (HOSPITAL_COMMUNITY)
Admission: RE | Admit: 2014-05-16 | Discharge: 2014-05-16 | Disposition: A | Discharge status: Home / Self Care | Payer: Medicare Other | Source: Ambulatory Visit | Attending: Diagnostic Radiology | Admitting: Diagnostic Radiology

## 2014-05-16 ENCOUNTER — Other Ambulatory Visit: Payer: Self-pay | Admitting: Cardiology

## 2014-05-16 ENCOUNTER — Encounter: Payer: Self-pay | Admitting: Cardiology

## 2014-05-16 VITALS — BP 140/64 | HR 70 | Ht 68.0 in | Wt 234.0 lb

## 2014-05-16 DIAGNOSIS — R9439 Abnormal result of other cardiovascular function study: Secondary | ICD-10-CM | POA: Insufficient documentation

## 2014-05-16 DIAGNOSIS — I200 Unstable angina: Secondary | ICD-10-CM

## 2014-05-16 DIAGNOSIS — I252 Old myocardial infarction: Secondary | ICD-10-CM | POA: Diagnosis not present

## 2014-05-16 DIAGNOSIS — Z87891 Personal history of nicotine dependence: Secondary | ICD-10-CM | POA: Insufficient documentation

## 2014-05-16 DIAGNOSIS — I2510 Atherosclerotic heart disease of native coronary artery without angina pectoris: Secondary | ICD-10-CM | POA: Diagnosis not present

## 2014-05-16 DIAGNOSIS — I25119 Atherosclerotic heart disease of native coronary artery with unspecified angina pectoris: Secondary | ICD-10-CM

## 2014-05-16 DIAGNOSIS — J439 Emphysema, unspecified: Secondary | ICD-10-CM | POA: Diagnosis not present

## 2014-05-16 DIAGNOSIS — Z01818 Encounter for other preprocedural examination: Secondary | ICD-10-CM

## 2014-05-16 DIAGNOSIS — I10 Essential (primary) hypertension: Secondary | ICD-10-CM | POA: Diagnosis not present

## 2014-05-16 DIAGNOSIS — E782 Mixed hyperlipidemia: Secondary | ICD-10-CM

## 2014-05-16 NOTE — Assessment & Plan Note (Signed)
Recent intermediate risk study consistent with mid to apical anterolateral ischemia as well as scar with peri-infarct ischemia in the inferior wall, LVEF 51%.

## 2014-05-16 NOTE — Assessment & Plan Note (Signed)
Progressive over the last few months. He has been compliant with his medications, and has generally stayed active including exercise at the gym, although is limited at this time. As reviewed above, plan is to proceed with a diagnostic heart catheterization in light of abnormal Cardiolite and progressive symptoms to reassess coronary anatomy for revascularization options. 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 Otherwise continues on long-term Plavix with his remaining medications. Has had no significant improvement on Imdur. Procedures being scheduled with Dr. Burt Knack on Thursday of this week. He will have lab work obtained that morning.

## 2014-05-16 NOTE — Assessment & Plan Note (Signed)
LDL 82, on high-dose Lipitor.

## 2014-05-16 NOTE — Progress Notes (Signed)
Reason for visit: Follow-up testing, CAD  Clinical Summary Corey [name] is a 78 y.o.male patient of mine, most recently seen by Dr. Bronson Ing earlier this month with symptoms concerning for accelerating angina. He was placed on Imdur in addition to his regular cardiac regimen and a follow-up Lexiscan Cardiolite was arranged.  Today we discussed his stress test results. Lexiscan Cardiolite was overall intermediate risk with evidence of mid to apical anterolateral ischemia as well as scar and peri-infarct ischemia in the inferior wall, LVEF 51%. This is consistent with progressive CAD.  He reports no improvement in symptoms since being on Imdur. He reports worsening shortness of breath with activity, NYHA class III, also intermittent angina symptoms that predominate in his upper back. We discussed proceeding with a diagnostic heart catheterization to reevaluate coronary anatomy and determine if there are any revascularization options. After reviewing the risks and benefits, he is in agreement to proceed.  Lipid panel from June showed cholesterol 133, triglycerides 211, HDL 28, LDL 82. He continues on high-dose Lipitor.   Allergies  Allergen Reactions  . Aspirin Nausea And Vomiting  . Other     Foods that are high in acid content cause sores and a rash.   . Penicillins Nausea And Vomiting    Current Outpatient Prescriptions  Medication Sig Dispense Refill  . Ascorbic Acid (VITAMIN C) 1000 MG tablet Take 1,000 mg by mouth daily.      [name] atorvastatin (LIPITOR) 80 MG tablet TAKE ONE TABLET BY MOUTH ONCE DAILY 30 tablet 0  . carvedilol (COREG) 12.5 MG tablet TAKE ONE TABLET BY MOUTH TWICE DAILY 60 tablet 0  . clopidogrel (PLAVIX) 75 MG tablet TAKE ONE TABLET BY MOUTH ONCE DAILY 90 tablet 3  . isosorbide mononitrate (IMDUR) 30 MG 24 hr tablet Take 1 tablet (30 mg total) by mouth daily. 90 tablet 3  . lisinopril (PRINIVIL,ZESTRIL) 20 MG tablet TAKE ONE TABLET BY MOUTH TWICE DAILY 60 tablet 1    . nitroGLYCERIN (NITROSTAT) 0.4 MG SL tablet Place 0.4 mg under the tongue every 5 (five) minutes as needed.      . Omega-3 Fatty Acids (FISH OIL) 1000 MG CAPS Take 1,000 mg by mouth 2 (two) times daily.    . vitamin E 1000 UNIT capsule Take 1,000 Units by mouth daily.       No current facility-administered medications for this visit.    Past Medical History  Diagnosis Date  . Coronary atherosclerosis of native coronary artery     BMS to RCA and circumflex 10/09, LVEF 55%  . Anaphylaxis     Aspirin  . Hyperlipidemia   . Hypertension   . Myocardial infarction     1998, NSTEMI 10/09  . Seasonal allergies     Past Surgical History  Procedure Laterality Date  . Colonoscopy  05/01/2011    Procedure: COLONOSCOPY;  Surgeon: Daneil Dolin, MD;  Location: [facility];  Service: Endoscopy;  Laterality: N/A;  11:30    Family History  Problem Relation Age of Onset  . Hypertension    . Colon cancer Neg Hx     Social History Corey [name] reports that he has quit smoking. His smoking use included Cigarettes. He has a 67.5 pack-year smoking history. He quit smokeless tobacco use about 17 years ago. Corey [name] reports that he does not drink alcohol.  Review of Systems Complete review of systems negative except as otherwise outlined in the clinical summary and also the following. He has had some nasal  congestion recently. No fevers or chills. No cough. Stable appetite. No unusual bleeding episodes.  Physical Examination Filed Vitals:   05/16/14 1336  BP: 140/64  Pulse: 70   Filed Weights   05/16/14 1336  Weight: 234 lb (106.142 kg)    Comfortable at rest. HEENT: Conjunctiva and lids normal, oropharynx clear.  Neck: Supple, no elevated jugular venous pressure or carotid bruits.  Lungs: Clear to auscultation, nonlabored.  Cardiac: Regular rate and rhythm, no S3.  Abdomen: Soft, nontender, no bruits.  Extremities: No pitting edema, distal pulses one plus.  Skin: Warm  and dry.  Musculoskeletal: No kyphosis.  Neuropsychiatric: Alert and oriented x3, affect appropriate.   Problem List and Plan   Accelerating angina Progressive over the last few months. He has been compliant with his medications, and has generally stayed active including exercise at the gym, although is limited at this time. As reviewed above, plan is to proceed with a diagnostic heart catheterization in light of abnormal Cardiolite and progressive symptoms to reassess coronary anatomy for revascularization options. 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 Otherwise continues on long-term Plavix with his remaining medications. Has had no significant improvement on Imdur. Procedures being scheduled with Dr. Burt Knack on Thursday of this week. He will have lab work obtained that morning.  CORONARY ATHEROSCLEROSIS NATIVE CORONARY ARTERY History of BMS to the RCA and circumflex in October 2009.  Abnormal myocardial perfusion study Recent intermediate risk study consistent with mid to apical anterolateral ischemia as well as scar with peri-infarct ischemia in the inferior wall, LVEF 51%.  Mixed hyperlipidemia LDL 82, on high-dose Lipitor.    Satira Sark, M.D., F.A.C.C.

## 2014-05-16 NOTE — Patient Instructions (Addendum)
Your physician recommends that you schedule a follow-up appointment in: will be made after heart cath,thisThursday November 19 th  Arrive at [facility] at 8:30 am for 10:30 am heart cath      Your physician has requested that you have a cardiac catheterization. Cardiac catheterization is used to diagnose and/or treat various heart conditions. Doctors may recommend this procedure for a number of different reasons. The most common reason is to evaluate chest pain. Chest pain can be a symptom of coronary artery disease (CAD), and cardiac catheterization can show whether plaque is narrowing or blocking your heart's arteries. This procedure is also used to evaluate the valves, as well as measure the blood flow and oxygen levels in different parts of your heart. For further information please visit [email or web address]. Please follow instruction sheet, as given.       Thank you for choosing [facility] !

## 2014-05-16 NOTE — Assessment & Plan Note (Signed)
History of BMS to the RCA and circumflex in October 2009.

## 2014-05-18 ENCOUNTER — Ambulatory Visit (HOSPITAL_COMMUNITY)
Admission: RE | Admit: 2014-05-18 | Discharge: 2014-05-19 | Disposition: A | Discharge status: Home / Self Care | Payer: Medicare Other | Source: Ambulatory Visit | Attending: Cardiovascular Disease | Admitting: Cardiovascular Disease

## 2014-05-18 ENCOUNTER — Encounter (HOSPITAL_COMMUNITY)
Admission: RE | Disposition: A | Discharge status: Home / Self Care | Payer: Self-pay | Source: Ambulatory Visit | Attending: Cardiovascular Disease

## 2014-05-18 ENCOUNTER — Encounter (HOSPITAL_COMMUNITY): Payer: Self-pay | Admitting: Cardiovascular Disease

## 2014-05-18 DIAGNOSIS — I2510 Atherosclerotic heart disease of native coronary artery without angina pectoris: Secondary | ICD-10-CM | POA: Diagnosis present

## 2014-05-18 DIAGNOSIS — I25119 Atherosclerotic heart disease of native coronary artery with unspecified angina pectoris: Secondary | ICD-10-CM | POA: Insufficient documentation

## 2014-05-18 DIAGNOSIS — I208 Other forms of angina pectoris: Secondary | ICD-10-CM | POA: Diagnosis present

## 2014-05-18 DIAGNOSIS — I2089 Other forms of angina pectoris: Secondary | ICD-10-CM | POA: Diagnosis present

## 2014-05-18 DIAGNOSIS — Z955 Presence of coronary angioplasty implant and graft: Secondary | ICD-10-CM | POA: Insufficient documentation

## 2014-05-18 DIAGNOSIS — I447 Left bundle-branch block, unspecified: Secondary | ICD-10-CM | POA: Insufficient documentation

## 2014-05-18 DIAGNOSIS — E785 Hyperlipidemia, unspecified: Secondary | ICD-10-CM | POA: Insufficient documentation

## 2014-05-18 DIAGNOSIS — I25110 Atherosclerotic heart disease of native coronary artery with unstable angina pectoris: Secondary | ICD-10-CM

## 2014-05-18 DIAGNOSIS — Z87891 Personal history of nicotine dependence: Secondary | ICD-10-CM | POA: Insufficient documentation

## 2014-05-18 DIAGNOSIS — I10 Essential (primary) hypertension: Secondary | ICD-10-CM | POA: Insufficient documentation

## 2014-05-18 DIAGNOSIS — R9439 Abnormal result of other cardiovascular function study: Secondary | ICD-10-CM | POA: Insufficient documentation

## 2014-05-18 DIAGNOSIS — I252 Old myocardial infarction: Secondary | ICD-10-CM | POA: Diagnosis not present

## 2014-05-18 DIAGNOSIS — Z886 Allergy status to analgesic agent status: Secondary | ICD-10-CM | POA: Diagnosis not present

## 2014-05-18 DIAGNOSIS — E782 Mixed hyperlipidemia: Secondary | ICD-10-CM | POA: Insufficient documentation

## 2014-05-18 DIAGNOSIS — R9431 Abnormal electrocardiogram [ECG] [EKG]: Secondary | ICD-10-CM | POA: Insufficient documentation

## 2014-05-18 DIAGNOSIS — I200 Unstable angina: Secondary | ICD-10-CM

## 2014-05-18 DIAGNOSIS — I517 Cardiomegaly: Secondary | ICD-10-CM | POA: Diagnosis not present

## 2014-05-18 HISTORY — DX: Non-ST elevation (NSTEMI) myocardial infarction: I21.4

## 2014-05-18 HISTORY — PX: LEFT HEART CATHETERIZATION WITH CORONARY ANGIOGRAM: SHX5451

## 2014-05-18 LAB — PROTIME-INR
INR: 1.09 (ref 0.00–1.49)
Prothrombin Time: 14.2 seconds (ref 11.6–15.2)

## 2014-05-18 LAB — CBC
HEMATOCRIT: 35.4 % — AB (ref 39.0–52.0)
HEMOGLOBIN: 11.7 g/dL — AB (ref 13.0–17.0)
MCH: 26.1 pg (ref 26.0–34.0)
MCHC: 33.1 g/dL (ref 30.0–36.0)
MCV: 79 fL (ref 78.0–100.0)
Platelets: 204 10*3/uL (ref 150–400)
RBC: 4.48 MIL/uL (ref 4.22–5.81)
RDW: 16.2 % — ABNORMAL HIGH (ref 11.5–15.5)
WBC: 5.5 10*3/uL (ref 4.0–10.5)

## 2014-05-18 LAB — BASIC METABOLIC PANEL
Anion gap: 14 (ref 5–15)
BUN: 12 mg/dL (ref 6–23)
CALCIUM: 9.1 mg/dL (ref 8.4–10.5)
CO2: 22 meq/L (ref 19–32)
Chloride: 105 mEq/L (ref 96–112)
Creatinine, Ser: 1.02 mg/dL (ref 0.50–1.35)
GFR calc Af Amer: 80 mL/min — ABNORMAL LOW (ref >90)
GFR calc non Af Amer: 69 mL/min — ABNORMAL LOW (ref >90)
Glucose, Bld: 110 mg/dL — ABNORMAL HIGH (ref 70–99)
Potassium: 4.2 mEq/L (ref 3.7–5.3)
Sodium: 141 mEq/L (ref 137–147)

## 2014-05-18 LAB — POCT ACTIVATED CLOTTING TIME
Activated Clotting Time: 259 seconds
Activated Clotting Time: 264 seconds

## 2014-05-18 SURGERY — LEFT HEART CATHETERIZATION WITH CORONARY ANGIOGRAM
Anesthesia: LOCAL

## 2014-05-18 MED ORDER — FENTANYL CITRATE 0.05 MG/ML IJ SOLN
INTRAMUSCULAR | Status: AC
  Filled 2014-05-18: qty 2

## 2014-05-18 MED ORDER — ONDANSETRON HCL 4 MG/2ML IJ SOLN: 4.0000 mg | Freq: Four times a day (QID) | INTRAMUSCULAR | Status: DC | PRN

## 2014-05-18 MED ORDER — LIDOCAINE HCL (PF) 1 % IJ SOLN
INTRAMUSCULAR | Status: AC
  Filled 2014-05-18: qty 30

## 2014-05-18 MED ORDER — ISOSORBIDE MONONITRATE ER 30 MG PO TB24
30.0000 mg | ORAL_TABLET | Freq: Every day | ORAL | Status: DC
  Administered 2014-05-19: 30 mg via ORAL
  Filled 2014-05-18 (×2): qty 1

## 2014-05-18 MED ORDER — HEPARIN (PORCINE) IN NACL 2-0.9 UNIT/ML-% IJ SOLN
INTRAMUSCULAR | Status: AC
  Filled 2014-05-18: qty 1000

## 2014-05-18 MED ORDER — SODIUM CHLORIDE 0.9 % IJ SOLN: 3.0000 mL | Freq: Two times a day (BID) | INTRAMUSCULAR | Status: DC

## 2014-05-18 MED ORDER — CARVEDILOL 12.5 MG PO TABS
12.5000 mg | ORAL_TABLET | Freq: Two times a day (BID) | ORAL | Status: DC
  Administered 2014-05-18 – 2014-05-19 (×2): 12.5 mg via ORAL
  Filled 2014-05-18 (×3): qty 1

## 2014-05-18 MED ORDER — ATORVASTATIN CALCIUM 80 MG PO TABS
80.0000 mg | ORAL_TABLET | Freq: Every day | ORAL | Status: DC
  Administered 2014-05-19: 80 mg via ORAL
  Filled 2014-05-18 (×2): qty 1

## 2014-05-18 MED ORDER — LISINOPRIL 20 MG PO TABS
20.0000 mg | ORAL_TABLET | Freq: Every day | ORAL | Status: DC
  Administered 2014-05-19: 20 mg via ORAL
  Filled 2014-05-18: qty 1

## 2014-05-18 MED ORDER — OXYCODONE-ACETAMINOPHEN 5-325 MG PO TABS: 1.0000 | ORAL_TABLET | ORAL | Status: DC | PRN

## 2014-05-18 MED ORDER — SODIUM CHLORIDE 0.9 % IV SOLN: 250.0000 mL | INTRAVENOUS | Status: DC | PRN

## 2014-05-18 MED ORDER — NITROGLYCERIN 1 MG/10 ML FOR IR/CATH LAB
INTRA_ARTERIAL | Status: AC
  Filled 2014-05-18: qty 10

## 2014-05-18 MED ORDER — SODIUM CHLORIDE 0.9 % IJ SOLN: 3.0000 mL | INTRAMUSCULAR | Status: DC | PRN

## 2014-05-18 MED ORDER — SODIUM CHLORIDE 0.9 % IJ SOLN
3.0000 mL | Freq: Two times a day (BID) | INTRAMUSCULAR | Status: DC
  Administered 2014-05-19: 3 mL via INTRAVENOUS

## 2014-05-18 MED ORDER — ACETAMINOPHEN 325 MG PO TABS: 650.0000 mg | ORAL_TABLET | ORAL | Status: DC | PRN

## 2014-05-18 MED ORDER — VERAPAMIL HCL 2.5 MG/ML IV SOLN
INTRAVENOUS | Status: AC
  Filled 2014-05-18: qty 2

## 2014-05-18 MED ORDER — HEPARIN SODIUM (PORCINE) 1000 UNIT/ML IJ SOLN
INTRAMUSCULAR | Status: AC
  Filled 2014-05-18: qty 1

## 2014-05-18 MED ORDER — SODIUM CHLORIDE 0.9 % IV SOLN: 1.0000 mL/kg/h | INTRAVENOUS | Status: AC

## 2014-05-18 MED ORDER — CLOPIDOGREL BISULFATE 75 MG PO TABS
75.0000 mg | ORAL_TABLET | Freq: Every day | ORAL | Status: DC
  Administered 2014-05-19: 09:00:00 75 mg via ORAL
  Filled 2014-05-18: qty 1

## 2014-05-18 MED ORDER — AMLODIPINE BESYLATE 5 MG PO TABS
5.0000 mg | ORAL_TABLET | Freq: Every day | ORAL | Status: DC
  Administered 2014-05-19: 5 mg via ORAL
  Filled 2014-05-18 (×3): qty 1

## 2014-05-18 MED ORDER — SODIUM CHLORIDE 0.9 % IV SOLN
INTRAVENOUS | Status: DC
  Administered 2014-05-18: 09:00:00 via INTRAVENOUS

## 2014-05-18 MED ORDER — MIDAZOLAM HCL 2 MG/2ML IJ SOLN
INTRAMUSCULAR | Status: AC
  Filled 2014-05-18: qty 2

## 2014-05-18 NOTE — H&P (View-Only) (Signed)
Reason for visit: Follow-up testing, CAD  Clinical Summary Mr. Corey [name] is a 78 y.o.male patient of mine, most recently seen by Dr. Bronson Ing earlier this month with symptoms concerning for accelerating angina. He was placed on Imdur in addition to his regular cardiac regimen and a follow-up Lexiscan Cardiolite was arranged.  Today we discussed his stress test results. Lexiscan Cardiolite was overall intermediate risk with evidence of mid to apical anterolateral ischemia as well as scar and peri-infarct ischemia in the inferior wall, LVEF 51%. This is consistent with progressive CAD.  He reports no improvement in symptoms since being on Imdur. He reports worsening shortness of breath with activity, NYHA class III, also intermittent angina symptoms that predominate in his upper back. We discussed proceeding with a diagnostic heart catheterization to reevaluate coronary anatomy and determine if there are any revascularization options. After reviewing the risks and benefits, he is in agreement to proceed.  Lipid panel from June showed cholesterol 133, triglycerides 211, HDL 28, LDL 82. He continues on high-dose Lipitor.   Allergies  Allergen Reactions  . Aspirin Nausea And Vomiting  . Other     Foods that are high in acid content cause sores and a rash.   . Penicillins Nausea And Vomiting    Current Outpatient Prescriptions  Medication Sig Dispense Refill  . Ascorbic Acid (VITAMIN C) 1000 MG tablet Take 1,000 mg by mouth daily.      [name] atorvastatin (LIPITOR) 80 MG tablet TAKE ONE TABLET BY MOUTH ONCE DAILY 30 tablet 0  . carvedilol (COREG) 12.5 MG tablet TAKE ONE TABLET BY MOUTH TWICE DAILY 60 tablet 0  . clopidogrel (PLAVIX) 75 MG tablet TAKE ONE TABLET BY MOUTH ONCE DAILY 90 tablet 3  . isosorbide mononitrate (IMDUR) 30 MG 24 hr tablet Take 1 tablet (30 mg total) by mouth daily. 90 tablet 3  . lisinopril (PRINIVIL,ZESTRIL) 20 MG tablet TAKE ONE TABLET BY MOUTH TWICE DAILY 60 tablet 1    . nitroGLYCERIN (NITROSTAT) 0.4 MG SL tablet Place 0.4 mg under the tongue every 5 (five) minutes as needed.      . Omega-3 Fatty Acids (FISH OIL) 1000 MG CAPS Take 1,000 mg by mouth 2 (two) times daily.    . vitamin E 1000 UNIT capsule Take 1,000 Units by mouth daily.       No current facility-administered medications for this visit.    Past Medical History  Diagnosis Date  . Coronary atherosclerosis of native coronary artery     BMS to RCA and circumflex 10/09, LVEF 55%  . Anaphylaxis     Aspirin  . Hyperlipidemia   . Hypertension   . Myocardial infarction     1998, NSTEMI 10/09  . Seasonal allergies     Past Surgical History  Procedure Laterality Date  . Colonoscopy  05/01/2011    Procedure: COLONOSCOPY;  Surgeon: Daneil Dolin, MD;  Location: [facility];  Service: Endoscopy;  Laterality: N/A;  11:30    Family History  Problem Relation Age of Onset  . Hypertension    . Colon cancer Neg Hx     Social History Mr. Fredell reports that he has quit smoking. His smoking use included Cigarettes. He has a 67.5 pack-year smoking history. He quit smokeless tobacco use about 17 years ago. Mr. Joslin reports that he does not drink alcohol.  Review of Systems Complete review of systems negative except as otherwise outlined in the clinical summary and also the following. He has had some nasal  congestion recently. No fevers or chills. No cough. Stable appetite. No unusual bleeding episodes.  Physical Examination Filed Vitals:   05/16/14 1336  BP: 140/64  Pulse: 70   Filed Weights   05/16/14 1336  Weight: 234 lb (106.142 kg)    Comfortable at rest. HEENT: Conjunctiva and lids normal, oropharynx clear.  Neck: Supple, no elevated jugular venous pressure or carotid bruits.  Lungs: Clear to auscultation, nonlabored.  Cardiac: Regular rate and rhythm, no S3.  Abdomen: Soft, nontender, no bruits.  Extremities: No pitting edema, distal pulses one plus.  Skin: Warm  and dry.  Musculoskeletal: No kyphosis.  Neuropsychiatric: Alert and oriented x3, affect appropriate.   Problem List and Plan   Accelerating angina Progressive over the last few months. He has been compliant with his medications, and has generally stayed active including exercise at the gym, although is limited at this time. As reviewed above, plan is to proceed with a diagnostic heart catheterization in light of abnormal Cardiolite and progressive symptoms to reassess coronary anatomy for revascularization options. 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 Otherwise continues on long-term Plavix with his remaining medications. Has had no significant improvement on Imdur. Procedures being scheduled with Dr. Burt Knack on Thursday of this week. He will have lab work obtained that morning.  CORONARY ATHEROSCLEROSIS NATIVE CORONARY ARTERY History of BMS to the RCA and circumflex in October 2009.  Abnormal myocardial perfusion study Recent intermediate risk study consistent with mid to apical anterolateral ischemia as well as scar with peri-infarct ischemia in the inferior wall, LVEF 51%.  Mixed hyperlipidemia LDL 82, on high-dose Lipitor.    Satira Sark, M.D., F.A.C.C.

## 2014-05-18 NOTE — Progress Notes (Signed)
TR BAND REMOVAL  LOCATION:    right radial  DEFLATED PER PROTOCOL:    Yes.    TIME BAND OFF / DRESSING APPLIED:    1800   SITE UPON ARRIVAL:    Level 0  SITE AFTER BAND REMOVAL:    Level 0  REVERSE ALLEN'S TEST:     positive  CIRCULATION SENSATION AND MOVEMENT:    Within Normal Limits   Yes.    COMMENTS:   Tolerated procedure well

## 2014-05-18 NOTE — CV Procedure (Signed)
Cardiac Catheterization Procedure Note  Name: Corey [name] MRN: [id number] DOB: 06/23/1936  Procedure: Left Heart Cath, Selective Coronary Angiography, LV angiography, PTCA and stenting of the first diagonal, Stenting of the mid-RCA  Indication: Progressive exertional angina, CCS Class 2 symptoms on maximal medical therapy, intermediate risk Myoview with anterolateral ischemia and scar/peri-infarct ischemia in the inferior wall. The patient is known to me from previous coronary interventions in 2009 when he underwent PCI of the left circumflex and right coronary arteries using bare-metal stent platforms.  Procedural Details:  The right wrist was prepped, draped, and anesthetized with 1% lidocaine. Using the modified Seldinger technique, a 5/6 French Slender sheath was introduced into the right radial artery. 3 mg of verapamil was administered through the sheath, weight-based unfractionated heparin was administered intravenously. Standard Judkins catheters were used for selective coronary angiography and left ventriculography. Catheter exchanges were performed over an exchange length guidewire.  PROCEDURAL FINDINGS Hemodynamics: AO 117/57 LV 123/17   Coronary angiography: Coronary dominance: right  Left mainstem: The left mainstem is patent with mild calcification and no significant obstruction. The vessel divides into the LAD, intermediate branch, and left circumflex.  Left anterior descending (LAD): The LAD is patent to the apex. There is diffuse calcified 50% mid LAD stenosis essentially unchanged from the previous study. The first diagonal has severe 95% stenosis just before it divides into twin vessels.  Left circumflex (LCx): The circumflex is patent. The stented segment is widely patent without stenosis. The intermediate branch is widely patent. The obtuse marginal branches are patent.  Right coronary artery (RCA): The RCA has mild ostial stenosis of approximately 30-40%.  The mid vessel has 75% eccentric stenosis. Further down in the mid vessel the previously stented segment is patent with diffuse 20% ISR. The distal vessel is patent. The PDA and PLA branches are patent.  Left ventriculography: There is hypokinesis of the basal and midinferior wall. The other LV wall segments contract vigorously. The estimated LVEF is 55-60%.  PCI Note:  Following the diagnostic procedure, the decision was made to proceed with PCI of the diagonal and RCA.  These vessels correlated with the ischemic regions on nuclear stress testing. Weight-based heparin was given for anticoagulation. Once a therapeutic ACT was achieved, a 6 [place] EBU 4.0 cm guide catheter was inserted.  A whisper coronary guidewire was used to cross the lesion in the diagonal.  The lesion was predilated with a 2.0 mm balloon.  The wire was then redirected into the larger more inferior branch of the diagonal. This region was redilated. The lesion was then stented with a 2.25 x 18 mm Xience Alpine DES.  The stent was postdilated with a 2.25 mm noncompliant balloon.  Following PCI, there was 0% residual stenosis and TIMI-3 flow.   Attention was then turned to the RCA. A JR4 guide catheter was used. The same whisper wire was used. The lesion was primarily stented with a 3.25 x 15 mm Xience Alpine DES. The stent was postdilated to 16 atm with a 3.5 mm noncompliant balloon. Final angiography confirmed an excellent result. The patient tolerated the procedure well. There were no immediate procedural complications. A TR band was used for radial hemostasis. The patient was transferred to the post catheterization recovery area for further monitoring.  PCI Data: Lesion 1 Vessel - Diagonal 1/Segment - proximal Percent Stenosis (pre)  99 TIMI-flow 3 Stent 2.25x18 mm Xience DES Percent Stenosis (post) 0 TIMI-flow (post) 3  Lesion 2: Vessel - RCA/Segment - mid  Percent Stenosis (pre)  75 TIMI-flow 3 Stent 3.25x15 mm Xience  DES Percent Stenosis (post) 0 TIMI-flow (post) 3  Estimated Blood Loss: minimal  Final Conclusions:   1. Three-vessel coronary artery disease with severe stenosis of the first diagonal branch of the LAD, continued patency of the stented segment in the left circumflex, continued patency of the stented segment in the RCA, and severe stenosis of the mid RCA 2. Mild segmental LV systolic contraction abnormality with preserved overall LVEF 3. Successful 2 vessel PCI as outlined above   Recommendations:  Continue clopidogrel 75 mg daily. The patient has a true aspirin allergy. Should be eligible for discharge home tomorrow morning.  Sherren Mocha MD, [facility] 05/18/2014, 2:42 PM

## 2014-05-18 NOTE — Interval H&P Note (Signed)
History and Physical Interval Note:  05/18/2014 1:23 PM  Corey [name]  has presented today for surgery, with the diagnosis of angina/positive stress test  The various methods of treatment have been discussed with the patient and family. After consideration of risks, benefits and other options for treatment, the patient has consented to  Procedure(s): LEFT HEART CATHETERIZATION WITH CORONARY ANGIOGRAM (N/A) as a surgical intervention .  The patient's history has been reviewed, patient examined, no change in status, stable for surgery.  I have reviewed the patient's chart and labs.  Questions were answered to the patient's satisfaction.    Cath Lab Visit (complete for each Cath Lab visit)  Clinical Evaluation Leading to the Procedure:   ACS: No.  Non-ACS:    Anginal Classification: CCS II  Anti-ischemic medical therapy: Maximal Therapy (2 or more classes of medications)  Non-Invasive Test Results: Intermediate-risk stress test findings: cardiac mortality 1-3%/year  Prior CABG: No previous CABG       Sherren Mocha

## 2014-05-19 ENCOUNTER — Telehealth: Payer: Self-pay | Admitting: *Deleted

## 2014-05-19 ENCOUNTER — Encounter (HOSPITAL_COMMUNITY): Payer: Self-pay | Admitting: Physician Assistant

## 2014-05-19 DIAGNOSIS — I252 Old myocardial infarction: Secondary | ICD-10-CM | POA: Diagnosis not present

## 2014-05-19 DIAGNOSIS — R9439 Abnormal result of other cardiovascular function study: Secondary | ICD-10-CM | POA: Diagnosis not present

## 2014-05-19 DIAGNOSIS — Z955 Presence of coronary angioplasty implant and graft: Secondary | ICD-10-CM | POA: Diagnosis not present

## 2014-05-19 DIAGNOSIS — I25118 Atherosclerotic heart disease of native coronary artery with other forms of angina pectoris: Secondary | ICD-10-CM

## 2014-05-19 DIAGNOSIS — I208 Other forms of angina pectoris: Secondary | ICD-10-CM

## 2014-05-19 DIAGNOSIS — I25119 Atherosclerotic heart disease of native coronary artery with unspecified angina pectoris: Secondary | ICD-10-CM | POA: Diagnosis not present

## 2014-05-19 LAB — BASIC METABOLIC PANEL
Anion gap: 12 (ref 5–15)
BUN: 11 mg/dL (ref 6–23)
CO2: 23 meq/L (ref 19–32)
Calcium: 8.7 mg/dL (ref 8.4–10.5)
Chloride: 107 mEq/L (ref 96–112)
Creatinine, Ser: 0.91 mg/dL (ref 0.50–1.35)
GFR calc Af Amer: >90 mL/min (ref >90)
GFR calc non Af Amer: 80 mL/min — ABNORMAL LOW (ref >90)
GLUCOSE: 151 mg/dL — AB (ref 70–99)
POTASSIUM: 4.1 meq/L (ref 3.7–5.3)
Sodium: 142 mEq/L (ref 137–147)

## 2014-05-19 LAB — CBC
HEMATOCRIT: 33.6 % — AB (ref 39.0–52.0)
Hemoglobin: 11.1 g/dL — ABNORMAL LOW (ref 13.0–17.0)
MCH: 26.1 pg (ref 26.0–34.0)
MCHC: 33 g/dL (ref 30.0–36.0)
MCV: 78.9 fL (ref 78.0–100.0)
PLATELETS: 200 10*3/uL (ref 150–400)
RBC: 4.26 MIL/uL (ref 4.22–5.81)
RDW: 16.1 % — ABNORMAL HIGH (ref 11.5–15.5)
WBC: 5.7 10*3/uL (ref 4.0–10.5)

## 2014-05-19 MED ORDER — CLOPIDOGREL BISULFATE 75 MG PO TABS: 75.0000 mg | ORAL_TABLET | Freq: Every day | ORAL | Status: DC

## 2014-05-19 MED ORDER — NITROGLYCERIN 0.4 MG SL SUBL: 0.4000 mg | SUBLINGUAL_TABLET | SUBLINGUAL | Status: DC | PRN

## 2014-05-19 NOTE — Discharge Instructions (Signed)
Angina Pectoris Angina pectoris is extreme discomfort in your chest, neck, or arm. Your doctor may call it just angina. It is caused by a lack of oxygen to your heart wall. It may feel like tightness or heavy pressure. It may feel like a crushing or squeezing pain. Some people say it feels like gas. It may go down your shoulders, back, and arms. Some people have symptoms other than pain. These include:  Tiredness.  Shortness of breath.  Cold sweats.  Feeling sick to your stomach (nausea). There are four types of angina:  Stable angina. This type often lasts the same amount of time each time it happens. Activity, stress, or excitement can bring it on. It often gets better after taking a medicine called nitroglycerin. This goes under your tongue.  Unstable angina. This type can happen when you are not active or even during sleep. It can suddenly get worse or happen more often. It may not get better after taking the special medicine. It can last up to 30 minutes.  Microvascular angina. This type is more common in women. It may be more severe or last longer than other types.  Prinzmetal angina. This type often happens when you are not active or in the early morning hours. HOME CARE   Only take medicines as told by your doctor.  Stay active or exercise more as told by your doctor.  Limit very hard activity as told by your doctor.  Limit heavy lifting as told by your doctor.  Keep a healthy weight.  Learn about and eat foods that are healthy for your heart.  Do not use any tobacco such as cigarettes, chewing tobacco, or e-cigarettes. GET HELP RIGHT AWAY IF:   You have chest, neck, deep shoulder, or arm pain or discomfort that lasts more than a few minutes.  You have chest, neck, deep shoulder, or arm pain or discomfort that goes away and comes back over and over again.  You have heavy sweating that seems to happen for no reason.  You have shortness of breath or trouble  breathing.  Your angina does not get better after a few minutes of rest.  Your angina does not get better after you take nitroglycerin medicine. These can all be symptoms of a heart attack. Get help right away. Call your local emergency service (911 in [place] Do not  drive yourself to the hospital. Do not  wait to for your symptoms to go away. MAKE SURE YOU:   Understand these instructions.  Will watch your condition.  Will get help right away if you are not doing well or get worse. Document Released: 12/03/2007 Document Revised: 06/21/2013 Document Reviewed: 10/18/2013 [facility] Patient Information 2015 [place], [place]. This information is not intended to replace advice given to you by your health care provider. Make sure you discuss any questions you have with your health care provider.  No driving for 24 hours. No lifting over 5 lbs for 1 week. No sexual activity for 1 week. Keep procedure site clean & dry. If you notice increased pain, swelling, bleeding or pus, call/return!  You may shower, but no soaking baths/hot tubs/pools for 1 week.

## 2014-05-19 NOTE — Progress Notes (Signed)
CARDIAC REHAB PHASE I   PRE:  Rate/Rhythm: 42 SR  BP:  Supine:   Sitting: 139/83  Standing:    SaO2:   MODE:  Ambulation: 1000 ft   POST:  Rate/Rhythm: 94 SR  BP:  Supine:   Sitting: 165/103 recheck after rest 151/68  Standing:    SaO2:  [id number] Pt tolerated ambulation well without c/o of cp or SOB. BP elevated after walk. Pt rested 5-7 minutes and rechecked 151/68. Completed stent discharge education with pt. He voices understanding. Pt agrees to [place]. CRP in [place], will send referral.  Rodney Langton RN 05/19/2014 8:57 AM

## 2014-05-19 NOTE — Care Management Note (Signed)
    Page 1 of 1   05/19/2014     11:23:45 AM CARE MANAGEMENT NOTE 05/19/2014  Patient:  Corey [name], Corey [name]   Account Number:  [id number]  Date Initiated:  05/19/2014  Documentation initiated by:  Kailan Laws  Subjective/Objective Assessment:   angina positive stress test     Action/Plan:   CM to follow for disposition needs   Anticipated DC Date:  05/19/2014   Anticipated DC Plan:  HOME/SELF CARE         Choice offered to / List presented to:             Status of service:  Completed, signed off Medicare Important Message given?  NO (If response is "NO", the following Medicare IM given date fields will be blank) Date Medicare IM given:   Medicare IM given by:   Date Additional Medicare IM given:   Additional Medicare IM given by:    Discharge Disposition:  HOME/SELF CARE  Per UR Regulation:    If discussed at Long Length of Stay Meetings, dates discussed:    Comments:  Treyvone Chelf RN, BSN, MSHL, CCM  Nurse - Case Manager,  (Unit [phone]  05/19/2014 Specialty med review:  Plavix Dispo:  home / self care.

## 2014-05-19 NOTE — Telephone Encounter (Signed)
Pt was just seen and we called his RX in for him to [facility] in [place], He needs all those called ion for 90 day. Pt would like this done today.

## 2014-05-19 NOTE — Progress Notes (Signed)
Patient Name: Corey [name] Date of Encounter: 05/19/2014     Active Problems:   Exertional angina    SUBJECTIVE  Denies any chest pain or SOB. He states he can't take ASA, every time he takes it, he gets real sick  CURRENT MEDS . amLODipine  5 mg Oral Daily  . atorvastatin  80 mg Oral Daily  . carvedilol  12.5 mg Oral BID  . clopidogrel  75 mg Oral Daily  . isosorbide mononitrate  30 mg Oral Daily  . lisinopril  20 mg Oral Daily  . sodium chloride  3 mL Intravenous Q12H    OBJECTIVE  Filed Vitals:   05/18/14 2000 05/18/14 2100 05/18/14 2359 05/19/14 0401  BP: 123/62 169/59 136/62 153/70  Pulse: 70 58  71  Temp:   97.7 F (36.5 C) 97.7 F (36.5 C)  TempSrc:   Oral Oral  Resp:   18 18  Height:      Weight:   233 lb 11 oz (106 kg)   SpO2: 97% 95% 95% 97%    Intake/Output Summary (Last 24 hours) at 05/19/14 0721 Last data filed at 05/19/14 0409  Gross per 24 hour  Intake    525 ml  Output   1500 ml  Net   -975 ml   Filed Weights   05/18/14 0831 05/18/14 2359  Weight: 234 lb (106.142 kg) 233 lb 11 oz (106 kg)    PHYSICAL EXAM  General: Pleasant, NAD. Neuro: Alert and oriented X 3. Moves all extremities spontaneously. Psych: Normal affect. HEENT:  Normal  Neck: Supple without bruits or JVD. Lungs:  Resp regular and unlabored, CTA. Heart: RRR no s3, s4, or murmurs. Abdomen: Soft, non-tender, non-distended, BS + x 4.  Extremities: No clubbing, cyanosis or edema. DP/PT/Radials 2+ and equal bilaterally.  Accessory Clinical Findings  CBC  Recent Labs  05/18/14 0900 05/19/14 0352  WBC 5.5 5.7  HGB 11.7* 11.1*  HCT 35.4* 33.6*  MCV 79.0 78.9  PLT 204 562   Basic Metabolic Panel  Recent Labs  05/18/14 0900 05/19/14 0352  NA 141 142  K 4.2 4.1  CL 105 107  CO2 22 23  GLUCOSE 110* 151*  BUN 12 11  CREATININE 1.02 0.91  CALCIUM 9.1 8.7    TELE NSR with HR 50-70s    ECG  NSR with HR 60s, LBBB  Echocardiogram  SUMMARY -  Overall left ventricular systolic function was at the lower    limits of normal. Left ventricular ejection fraction was    estimated , range being 50 % to 55 %. There was no diagnostic    evidence of left ventricular regional wall motion    abnormalities. Left ventricular wall thickness was mildly    increased. - There was mild mitral annular calcification. - The left atrium was mildly dilated.     Radiology/Studies  Dg Chest 2 View  05/16/2014   CLINICAL DATA:  Preoperative evaluation for cardiac catheterization, intermittent shortness of breath with exertion, history coronary artery disease post MI, hypertension, former smoker  EXAM: CHEST  2 VIEW  COMPARISON:  04/24/2008  FINDINGS: Normal heart size, mediastinal contours, and pulmonary vascularity.  Lungs appear emphysematous but clear.  No pleural effusion or pneumothorax.  Bones unremarkable.  IMPRESSION: Emphysematous changes.  No acute abnormalities.   Electronically Signed   By: Lavonia Dana M.D.   On: 05/16/2014 23:05   Nm Myocar Multi W/spect W/wall Motion / Ef  05/12/2014   CLINICAL DATA:  78 year old male with history of CAD status post BMS to the RCA and circumflex in 2009, hypertension, and hyperlipidemia. He has had chest discomfort as well as shortness of breath, and this study is requested to evaluate for the presence and extent of ischemia.  EXAM: MYOCARDIAL IMAGING WITH SPECT (REST AND PHARMACOLOGIC-STRESS)  GATED LEFT VENTRICULAR WALL MOTION STUDY  LEFT VENTRICULAR EJECTION FRACTION  TECHNIQUE: Standard myocardial SPECT imaging was performed after resting intravenous injection of 10 mCi Tc-7m sestamibi. Subsequently, intravenous infusion of Lexiscan was performed under the supervision of the Cardiology staff. At peak effect of the drug, 30 mCi Tc-6m sestamibi was injected intravenously and standard myocardial SPECT imaging was performed. Quantitative gated imaging was also performed to evaluate left  ventricular wall motion, and estimate left ventricular ejection fraction.  FINDINGS: Baseline tracing shows sinus rhythm with frequent PACs, increased voltage, and inferolateral ST T wave abnormalities. Lexiscan bolus was given in standard fashion. Heart rate increased from 66 beats per min up to 88 beats per min, and blood pressure increased from 148/89 up to 162/83. No chest pain was reported. No clearly diagnostic ST segment abnormalities were noted over baseline changes. PACs noted.  Analysis of the overall perfusion data finds adequate myocardial radiotracer uptake with diaphragmatic attenuation  Perfusion: There is a moderate-sized, mild to moderate intensity, mid apical anterolateral defect that is partially reversible on suggestive of ischemia. There is also a moderate-sized inferolateral defect from apex to base that is fixed with the exception of partial reversibility near the apex. This is most suggestive scar with associated peri-infarct ischemia.  Wall Motion: No focal wall motion abnormalities are noted on gated imaging.  Left Ventricular Ejection Fraction: 51 %  End diastolic volume 962. Ml  End systolic volume 56 ml  IMPRESSION: 1. Abnormal study. Region of ischemia noted in the mid to apical anterolateral wall with associated scar and peri-infarct ischemia in the inferior wall.  2. Normal left ventricular wall motion.  3. Left ventricular ejection fraction 51%  4. Intermediate-risk stress test findings*.  *2012 Appropriate Use Criteria for Coronary Revascularization Focused Update: J Am Coll Cardiol. 8366;29(4):765-465. http://content.airportbarriers.com.aspx?articleid=1201161   Electronically Signed   By: Rozann Lesches M.D.   On: 05/12/2014 14:20    ASSESSMENT AND PLAN  1. Accelerating angina  - recent intermediate risk study consistent with mid and apical anterolateral ischemia  - cath 11/19 three vessel CAD with severe stenosis of D1 of LAD and mid RCA treated with DES x 2, EF  55-60%  - continue plavix, Imdur, coreg and statin. Discharge today.  2. CAD s/p BMS to RCA and LCx in 03/2008 3. Hyperlipidemia 4. Anaphylactic reaction to ASA  Signed, Almyra Deforest PA-C Pager: [phone]  Patient seen and examined. Agree with assessment and plan. No recurrent cp s/p PCI with DES stenting to Dx and RCA. Cath site stable. DC today.   Troy Sine, MD, [facility] 05/19/2014 9:15 AM

## 2014-05-19 NOTE — Telephone Encounter (Signed)
Spoke with Lavella Lemons at [facility] and mad refills 90 days

## 2014-05-19 NOTE — Discharge Summary (Signed)
Discharge Summary   Patient ID: Corey [name],  MRN: [id number], DOB/AGE: 1936-01-22 78 y.o.  Admit date: 05/18/2014 Discharge date: 05/19/2014  Primary Care Provider: Rocky Morel Primary Cardiologist: Dr. Domenic Polite  Discharge Diagnoses Principal Problem:   Exertional angina  - Cath 05/18/2014 DES to D1 and mid RCA Active Problems:   Mixed hyperlipidemia   Essential hypertension   CORONARY ATHEROSCLEROSIS NATIVE CORONARY ARTERY   Abnormal myocardial perfusion study   Allergies Allergies  Allergen Reactions  . Aspirin Nausea And Vomiting  . Other     Foods that are high in acid content cause sores and a rash.   . Penicillins Nausea And Vomiting    Procedures  Cardiac catheterization Procedure: Left Heart Cath, Selective Coronary Angiography, LV angiography, PTCA and stenting of the first diagonal, Stenting of the mid-RCA  PROCEDURAL FINDINGS Hemodynamics: AO 117/57 LV 123/17  PCI Data: Lesion 1 Vessel - Diagonal 1/Segment - proximal Percent Stenosis (pre) 99 TIMI-flow 3 Stent 2.25x18 mm Xience DES Percent Stenosis (post) 0 TIMI-flow (post) 3  Lesion 2: Vessel - RCA/Segment - mid Percent Stenosis (pre) 75 TIMI-flow 3 Stent 3.25x15 mm Xience DES Percent Stenosis (post) 0 TIMI-flow (post) 3  Estimated Blood Loss: minimal  Final Conclusions:  1. Three-vessel coronary artery disease with severe stenosis of the first diagonal branch of the LAD, continued patency of the stented segment in the left circumflex, continued patency of the stented segment in the RCA, and severe stenosis of the mid RCA 2. Mild segmental LV systolic contraction abnormality with preserved overall LVEF 3. Successful 2 vessel PCI as outlined above   Recommendations:  Continue clopidogrel 75 mg daily. The patient has a true aspirin allergy. Should be eligible for discharge home tomorrow morning.    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 Patient was recently seen by Dr. Domenic Polite in our recent office on 05/16/2014. He was having progressive angina since earlier this month. Lexiscan stress test was performed recently which was intermediate risk study consistent with mid and apical anterolateral ischemia. Various treatment option has been discussed with the patient, it was decided for the patient to undergo cardiac catheterization.  He underwent scheduled cardiac catheterization on 05/18/2014 which showed three-vessel native coronary artery disease with severe stenosis of D1 and mid RCA treated with drug-eluting stent. He was seen the morning of 05/19/2014, at which time he denies any significant chest discomfort or shortness breath. He has been placed on Plavix monotherapy due to severe allergic reaction to aspirin. He is deemed stable for discharge on cardiology perspective. I have scheduled a follow-up with Dr. Myles Gip office in 2-4 weeks after discharge.   Discharge Vitals Blood pressure 137/59, pulse 78, temperature 97.9 F (36.6 C), temperature source Oral, resp. rate 18, height 5\' 8"  (1.727 m), weight 233 lb 11 oz (106 kg), SpO2 97 %.  Filed Weights   05/18/14 0831 05/18/14 2359  Weight: 234 lb (106.142 kg) 233 lb 11 oz (106 kg)    Labs  CBC  Recent Labs  05/18/14 0900 05/19/14 0352  WBC 5.5 5.7  HGB 11.7* 11.1*  HCT 35.4* 33.6*  MCV 79.0 78.9  PLT 204 892   Basic Metabolic Panel  Recent Labs  05/18/14 0900 05/19/14 0352  NA 141 142  K 4.2 4.1  CL 105 107  CO2 22 23  GLUCOSE 110* 151*  BUN 12 11  CREATININE 1.02 0.91  CALCIUM 9.1 8.7   Disposition  Pt is being discharged home today in good condition.  Follow-up Plans & Appointments      Follow-up Information    Follow up with Rozann Lesches, MD On 06/12/2014.   Specialty:  Cardiology   Why:  4:20pm   Contact information:    [place] [address] [phone]       Discharge Medications    Medication List    TAKE these medications        amLODipine 5 MG tablet  Commonly known as:  NORVASC  Take 5 mg by mouth daily.     atorvastatin 80 MG tablet  Commonly known as:  LIPITOR  TAKE ONE TABLET BY MOUTH ONCE DAILY     carvedilol 12.5 MG tablet  Commonly known as:  COREG  TAKE ONE TABLET BY MOUTH TWICE DAILY     clopidogrel 75 MG tablet  Commonly known as:  PLAVIX  Take 1 tablet (75 mg total) by mouth daily.     Fish Oil 1000 MG Caps  Take 1,000 mg by mouth 2 (two) times daily.     isosorbide mononitrate 30 MG 24 hr tablet  Commonly known as:  IMDUR  Take 1 tablet (30 mg total) by mouth daily.     lisinopril 20 MG tablet  Commonly known as:  PRINIVIL,ZESTRIL  TAKE ONE TABLET BY MOUTH TWICE DAILY     nitroGLYCERIN 0.4 MG SL tablet  Commonly known as:  NITROSTAT  Place 0.4 mg under the tongue every 5 (five) minutes as needed.     vitamin C 1000 MG tablet  Take 1,000 mg by mouth daily.     vitamin E 1000 UNIT capsule  Take 1,000 Units by mouth daily.        Duration of Discharge Encounter   Greater than 30 minutes including physician time.  Hilbert Corrigan PA-C Pager: [phone] 05/19/2014, 11:03 AM

## 2014-05-20 ENCOUNTER — Other Ambulatory Visit: Payer: Self-pay | Admitting: Cardiology

## 2014-05-22 ENCOUNTER — Other Ambulatory Visit: Payer: Self-pay | Admitting: Cardiology

## 2014-05-22 NOTE — Telephone Encounter (Signed)
Received fax refill request  Rx # [id number] Medication:  Lipitor 80 mg tab Qty 90 Sig:  Take one tablet by mouth once daily Physician:  Domenic Polite

## 2014-05-22 NOTE — Telephone Encounter (Signed)
Medication sent to pharmacy  

## 2014-06-08 ENCOUNTER — Encounter (HOSPITAL_COMMUNITY): Payer: Self-pay | Admitting: Cardiovascular Disease

## 2014-06-12 ENCOUNTER — Ambulatory Visit: Payer: 59 | Admitting: Cardiology

## 2014-06-12 ENCOUNTER — Encounter: Payer: Self-pay | Admitting: Cardiology

## 2014-06-12 ENCOUNTER — Ambulatory Visit (INDEPENDENT_AMBULATORY_CARE_PROVIDER_SITE_OTHER): Payer: Medicare Other | Admitting: Cardiology

## 2014-06-12 VITALS — BP 176/92 | HR 70 | Ht 68.0 in | Wt 236.0 lb

## 2014-06-12 DIAGNOSIS — E782 Mixed hyperlipidemia: Secondary | ICD-10-CM

## 2014-06-12 DIAGNOSIS — I2510 Atherosclerotic heart disease of native coronary artery without angina pectoris: Secondary | ICD-10-CM

## 2014-06-12 NOTE — Assessment & Plan Note (Signed)
Symptomatically stable at this time status post recent percutaneous coronary intervention as outlined above. Continue Plavix (aspirin allergy), and other medical therapy. I have asked him to keep an eye on his blood pressure, and if this trend continues, we will need to further adjust his medications, possibly increase Norvasc. Follow-up arranged within the next 3 months. He should get back to his regular exercise regimen as well.

## 2014-06-12 NOTE — Progress Notes (Signed)
Reason for visit: Hospital follow-up, CAD  Clinical Summary Corey [name] is a 78 y.o.male last seen in November. He was referred for a heart catheterization with progressive angina symptoms in the face of abnormal Cardiolite study. He was found to have patent stent sites within the circumflex and RCA with progressive disease in the mid RCA as well as first diagonal branch. He underwent placement of DES to the mid RCA and first diagonal branch, tolerated the procedure well.  Lab work from November showed potassium 4.1, BUN 11, creatinine 0.9, hemoglobin 11.1, platelets 200.  He comes in for a routine visit today. States he feels much better, has less shortness of breath, no angina symptoms. His blood pressure was elevated, however he thinks this may have been somewhat atypical, he has been moving some furniture and strained himself somewhat today. Reports compliance with his medications. He does have a blood pressure cuff at home to check his blood pressure. I asked him to keep an eye on this.   Allergies  Allergen Reactions  . Aspirin Nausea And Vomiting  . Other     Foods that are high in acid content cause sores and a rash.   . Penicillins Nausea And Vomiting    Current Outpatient Prescriptions  Medication Sig Dispense Refill  . amLODipine (NORVASC) 5 MG tablet Take 5 mg by mouth daily.    . Ascorbic Acid (VITAMIN C) 1000 MG tablet Take 1,000 mg by mouth daily.      [name] atorvastatin (LIPITOR) 80 MG tablet TAKE ONE TABLET BY MOUTH ONCE DAILY -  NEEDS  OFFICE  VISIT 30 tablet 3  . carvedilol (COREG) 12.5 MG tablet TAKE ONE TABLET BY MOUTH TWICE DAILY 60 tablet 0  . clopidogrel (PLAVIX) 75 MG tablet Take 1 tablet (75 mg total) by mouth daily. 90 tablet 3  . isosorbide mononitrate (IMDUR) 30 MG 24 hr tablet Take 1 tablet (30 mg total) by mouth daily. 90 tablet 3  . lisinopril (PRINIVIL,ZESTRIL) 20 MG tablet TAKE ONE TABLET BY MOUTH TWICE DAILY 60 tablet 1  . nitroGLYCERIN (NITROSTAT) 0.4  MG SL tablet Place 1 tablet (0.4 mg total) under the tongue every 5 (five) minutes as needed. 25 tablet 0  . Omega-3 Fatty Acids (FISH OIL) 1000 MG CAPS Take 1,000 mg by mouth 2 (two) times daily.    . vitamin E 1000 UNIT capsule Take 1,000 Units by mouth daily.       No current facility-administered medications for this visit.    Past Medical History  Diagnosis Date  . Coronary atherosclerosis of native coronary artery     BMS to RCA and circumflex 10/09, LVEF 55%  . Anaphylaxis     Aspirin  . Hyperlipidemia   . Hypertension   . Seasonal allergies   . Exertional angina 05/18/2014  . Myocardial infarction 1998  . NSTEMI (non-ST elevated myocardial infarction) 03/2008, 04/2014    Cath 05/18/2014 DES to D1 and mid RCA    Social History Mr. Abend reports that he quit smoking about 17 years ago. His smoking use included Cigarettes. He has a 90 pack-year smoking history. He has never used smokeless tobacco. Mr. Hussar reports that he does not drink alcohol.  Review of Systems Complete review of systems negative except as otherwise outlined in the clinical summary and also the following. No bleeding problems on Plavix.  Physical Examination Filed Vitals:   06/12/14 1624  BP: 176/92  Pulse: 70   Filed Weights   06/12/14 1624  Weight: 236 lb (107.049 kg)    Comfortable at rest. HEENT: Conjunctiva and lids normal, oropharynx clear.  Neck: Supple, no elevated jugular venous pressure or carotid bruits.  Lungs: Clear to auscultation, nonlabored.  Cardiac: Regular rate and rhythm, no S3.  Abdomen: Soft, nontender, no bruits.  Extremities: No pitting edema, distal pulses one plus. Small firm knot proximal to his radial artery catheterization site on the right, nontender. Skin: Warm and dry.  Musculoskeletal: No kyphosis.  Neuropsychiatric: Alert and oriented x3, affect appropriate.   Problem List and Plan   CORONARY ATHEROSCLEROSIS NATIVE CORONARY  ARTERY Symptomatically stable at this time status post recent percutaneous coronary intervention as outlined above. Continue Plavix (aspirin allergy), and other medical therapy. I have asked him to keep an eye on his blood pressure, and if this trend continues, we will need to further adjust his medications, possibly increase Norvasc. Follow-up arranged within the next 3 months. He should get back to his regular exercise regimen as well.  Mixed hyperlipidemia Continues on Lipitor.    Satira Sark, M.D., F.A.C.C.

## 2014-06-12 NOTE — Patient Instructions (Signed)
Your physician recommends that you schedule a follow-up appointment in:  3 months    Your physician recommends that you continue on your current medications as directed. Please refer to the Current Medication list given to you today.     Thank you for choosing [facility] !   

## 2014-06-12 NOTE — Assessment & Plan Note (Signed)
Continues on Lipitor.

## 2014-07-20 ENCOUNTER — Other Ambulatory Visit: Payer: Self-pay | Admitting: Cardiology

## 2014-07-20 MED ORDER — ATORVASTATIN CALCIUM 80 MG PO TABS: 80.0000 mg | ORAL_TABLET | Freq: Every day | ORAL | Status: DC

## 2014-07-20 NOTE — Telephone Encounter (Signed)
Received fax refill request  Rx # [id number] Medication:  Lipitor 80 mg tab Qty 30 Sig:  Take one tablet by mouth once daily Physician:  Domenic Polite

## 2014-07-20 NOTE — Telephone Encounter (Signed)
escribed rx complete

## 2014-07-27 ENCOUNTER — Other Ambulatory Visit: Payer: Self-pay | Admitting: *Deleted

## 2014-07-27 ENCOUNTER — Other Ambulatory Visit: Payer: Self-pay | Admitting: Cardiology

## 2014-07-27 MED ORDER — ATORVASTATIN CALCIUM 80 MG PO TABS: 80.0000 mg | ORAL_TABLET | Freq: Every day | ORAL | Status: DC

## 2014-07-27 NOTE — Telephone Encounter (Signed)
Refill re-sent via escribe

## 2014-07-27 NOTE — Telephone Encounter (Signed)
PT states that [facility] is telling him they never got request for atorvastatin, we show it being done on 07/20/14/tmj

## 2014-09-18 ENCOUNTER — Ambulatory Visit (INDEPENDENT_AMBULATORY_CARE_PROVIDER_SITE_OTHER): Payer: Medicare Other | Admitting: Cardiology

## 2014-09-18 ENCOUNTER — Encounter: Payer: Self-pay | Admitting: Cardiology

## 2014-09-18 VITALS — BP 138/64 | HR 70 | Ht 68.0 in | Wt 244.0 lb

## 2014-09-18 DIAGNOSIS — E782 Mixed hyperlipidemia: Secondary | ICD-10-CM | POA: Diagnosis not present

## 2014-09-18 DIAGNOSIS — I2510 Atherosclerotic heart disease of native coronary artery without angina pectoris: Secondary | ICD-10-CM | POA: Diagnosis not present

## 2014-09-18 DIAGNOSIS — I10 Essential (primary) hypertension: Secondary | ICD-10-CM | POA: Diagnosis not present

## 2014-09-18 NOTE — Patient Instructions (Signed)
Your physician wants you to follow-up in: 6 months with Dr.McDowell You will receive a reminder letter in the mail two months in advance. If you don't receive a letter, please call our office to schedule the follow-up appointment.     Your physician recommends that you continue on your current medications as directed. Please refer to the Current Medication list given to you today.     Thank you for choosing [facility] !        

## 2014-09-18 NOTE — Progress Notes (Signed)
Cardiology Office Note  Date: 09/18/2014   ID: Corey [name], DOB 1935-07-06, MRN [id number]  PCP: Rocky Morel, MD  Primary Cardiologist: Rozann Lesches, MD   Chief Complaint  Patient presents with  . Coronary Artery Disease    History of Present Illness: Corey [name] is a 79 y.o. male last seen in December 2015. He presents for a follow-up visit. Reports no angina symptoms, exercising at the [facility] most days during the week. He reports no change in his medications which we reviewed below. He has not required any nitroglycerin. We plan to continue Plavix long-term and light of his aspirin allergy, he reports no bleeding problems. Lipids is been followed by primary care with good control on Lipitor and omega-3 supplements.  Past Medical History  Diagnosis Date  . Coronary atherosclerosis of native coronary artery     BMS to RCA and circumflex 10/09, DES to D1 and RCA 04/2014  . Anaphylaxis     Aspirin  . Hyperlipidemia   . Essential hypertension   . Seasonal allergies   . Myocardial infarction 1998  . NSTEMI (non-ST elevated myocardial infarction) 03/2008, 04/2014     Current Outpatient Prescriptions  Medication Sig Dispense Refill  . amLODipine (NORVASC) 5 MG tablet Take 5 mg by mouth daily.    . Ascorbic Acid (VITAMIN C) 1000 MG tablet Take 1,000 mg by mouth daily.      [name] atorvastatin (LIPITOR) 80 MG tablet Take 1 tablet (80 mg total) by mouth daily at 6 PM. 30 tablet 6  . carvedilol (COREG) 12.5 MG tablet TAKE ONE TABLET BY MOUTH TWICE DAILY 60 tablet 0  . carvedilol (COREG) 12.5 MG tablet TAKE ONE TABLET BY MOUTH TWICE DAILY 180 tablet 3  . clopidogrel (PLAVIX) 75 MG tablet Take 1 tablet (75 mg total) by mouth daily. 90 tablet 3  . isosorbide mononitrate (IMDUR) 30 MG 24 hr tablet Take 1 tablet (30 mg total) by mouth daily. 90 tablet 3  . lisinopril (PRINIVIL,ZESTRIL) 20 MG tablet TAKE ONE TABLET BY MOUTH TWICE DAILY 60 tablet 1  . nitroGLYCERIN  (NITROSTAT) 0.4 MG SL tablet Place 1 tablet (0.4 mg total) under the tongue every 5 (five) minutes as needed. 25 tablet 0  . Omega-3 Fatty Acids (FISH OIL) 1000 MG CAPS Take 1,000 mg by mouth 2 (two) times daily.    . vitamin E 1000 UNIT capsule Take 1,000 Units by mouth daily.       No current facility-administered medications for this visit.    Allergies:  Aspirin; Other; and Penicillins   Social History: The patient  reports that he quit smoking about 18 years ago. His smoking use included Cigarettes. He has a 90 pack-year smoking history. He has never used smokeless tobacco. He reports that he does not drink alcohol or use illicit drugs.    ROS:  Please see the history of present illness. Otherwise, complete review of systems is positive for arthritic pains intermittently.  All other systems are reviewed and negative.    Physical Exam: VS:  BP 138/64 mmHg  Pulse 70  Ht 5\' 8"  (1.727 m)  Wt 244 lb (110.678 kg)  BMI 37.11 kg/m2  SpO2 95%, BMI Body mass index is 37.11 kg/(m^2).  Wt Readings from Last 3 Encounters:  09/18/14 244 lb (110.678 kg)  06/12/14 236 lb (107.049 kg)  05/18/14 233 lb 11 oz (106 kg)     Comfortable at rest. HEENT: Conjunctiva and lids normal, oropharynx clear.  Neck:  Supple, no elevated jugular venous pressure or carotid bruits.  Lungs: Clear to auscultation, nonlabored.  Cardiac: Regular rate and rhythm, no S3.  Abdomen: Soft, nontender, no bruits.  Extremities: No pitting edema, distal pulses one plus. Small firm knot proximal to his radial artery catheterization site on the right, nontender. Skin: Warm and dry.  Musculoskeletal: No kyphosis.  Neuropsychiatric: Alert and oriented x3, affect appropriate.   ECG: ECG is not ordered today.  Recent Labwork: 05/19/2014: BUN 11; Creatinine 0.91; Hemoglobin 11.1*; Platelets 200; Potassium 4.1; Sodium 142     Component Value Date/Time   CHOL 130 04/20/2013 1140   TRIG 113 04/20/2013 1140   HDL  30* 04/20/2013 1140   CHOLHDL 4.3 04/20/2013 1140   VLDL 23 04/20/2013 1140   LDLCALC 77 04/20/2013 1140    ASSESSMENT AND PLAN:  1. Multivessel CAD status post interventions as outlined above, most recently DES to the first diagonal and RCA in November 2015. He has an aspirin allergy and continues on Plavix long-term. Symptomatically stable, continue regular exercise plan.  2. Hyperlipidemia, on Lipitor and omega-3 supplements.  3. Essential hypertension, no change made antihypertensive regimen.   Current medicines are reviewed with the patient today. No changes were made.   Disposition: FU with me in 6 months.   Signed, Satira Sark, MD, [facility] 09/18/2014 2:18 PM    [facility] at [facility] [address] [place], [place] [address] Phone: [phone]; Fax: [phone]

## 2014-11-15 ENCOUNTER — Other Ambulatory Visit: Payer: Self-pay | Admitting: Cardiology

## 2015-02-14 ENCOUNTER — Other Ambulatory Visit: Payer: Self-pay

## 2015-02-14 MED ORDER — ATORVASTATIN CALCIUM 80 MG PO TABS: 80.0000 mg | ORAL_TABLET | Freq: Every day | ORAL | Status: DC

## 2015-02-14 NOTE — Telephone Encounter (Signed)
Refill complete 

## 2015-03-19 ENCOUNTER — Other Ambulatory Visit: Payer: Self-pay | Admitting: Cardiology

## 2015-04-23 ENCOUNTER — Encounter: Payer: Self-pay | Admitting: Cardiology

## 2015-04-23 ENCOUNTER — Ambulatory Visit (INDEPENDENT_AMBULATORY_CARE_PROVIDER_SITE_OTHER): Payer: Medicare Other | Admitting: Cardiology

## 2015-04-23 VITALS — BP 154/70 | HR 68 | Ht 68.0 in | Wt 236.0 lb

## 2015-04-23 DIAGNOSIS — I2510 Atherosclerotic heart disease of native coronary artery without angina pectoris: Secondary | ICD-10-CM | POA: Diagnosis not present

## 2015-04-23 DIAGNOSIS — E782 Mixed hyperlipidemia: Secondary | ICD-10-CM

## 2015-04-23 DIAGNOSIS — I10 Essential (primary) hypertension: Secondary | ICD-10-CM

## 2015-04-23 NOTE — Patient Instructions (Signed)
Your physician wants you to follow-up in: 6 months with Dr McDowell You will receive a reminder letter in the mail two months in advance. If you don't receive a letter, please call our office to schedule the follow-up appointment.     Your physician recommends that you continue on your current medications as directed. Please refer to the Current Medication list given to you today.    If you need a refill on your cardiac medications before your next appointment, please call your pharmacy.     Thank you for choosing [facility] !        

## 2015-04-23 NOTE — Progress Notes (Signed)
Cardiology Office Note  Date: 04/23/2015   ID: WARD BOISSONNEAULT, DOB Sep 23, 1935, MRN [id number]  PCP: Rocky Morel, MD  Primary Cardiologist: Rozann Lesches, MD   Chief Complaint  Patient presents with  . Coronary Artery Disease    History of Present Illness: Corey [name] is a 79 y.o. male last seen in March. He presents for a routine follow-up visit. He has been exercising at the [facility] 3 or 4 days a week as before. Uses light weights for a work out, also stationary bicycle and treadmill. We discussed his medications which are outlined below. He has not required any nitroglycerin. No angina symptoms and stable NYHA class II dyspnea.  He has an aspirin allergy and continues on Plavix long-term. Most recent cardiac intervention including DES to the first diagonal and RCA in November 2015. At that time he was noted to have continued patency of the stented portions of the circumflex and previously stented RCA from 2009.  ECG today shows sinus bradycardia with PACs and increased voltage, repolarization abnormalities noted as before.  He reports having had lab work at [facility] earlier in the year, we will request for review. He continues on Lipitor, and over time his lipids have been well controlled, lab work from 2014 is reviewed.  Past Medical History  Diagnosis Date  . Coronary atherosclerosis of native coronary artery     BMS to RCA and circumflex 10/09, DES to D1 and RCA 04/2014  . Anaphylaxis     Aspirin  . Hyperlipidemia   . Essential hypertension   . Seasonal allergies   . Myocardial infarction ([place]) 1998  . NSTEMI (non-ST elevated myocardial infarction) ([place]) 03/2008    Past Surgical History  Procedure Laterality Date  . Colonoscopy  05/01/2011    Procedure: COLONOSCOPY;  Surgeon: Daneil Dolin, MD;  Location: [facility];  Service: Endoscopy;  Laterality: N/A;  11:30  . Coronary angioplasty with stent placement  07/1996; 03/2008; 05/18/2014    "?3;  ?2; 2"  . Cardiac catheterization      Cath 05/18/2014 DES to D1 and mid RCA  . Left heart catheterization with coronary angiogram N/A 05/18/2014    Procedure: LEFT HEART CATHETERIZATION WITH CORONARY ANGIOGRAM;  Surgeon: Blane Ohara, MD;  Location: [facility] CATH LAB;  Service: Cardiovascular;  Laterality: N/A;    Current Outpatient Prescriptions  Medication Sig Dispense Refill  . amLODipine (NORVASC) 5 MG tablet TAKE ONE TABLET BY MOUTH ONCE DAILY 90 tablet 3  . Ascorbic Acid (VITAMIN C) 1000 MG tablet Take 1,000 mg by mouth daily.      [name] atorvastatin (LIPITOR) 80 MG tablet Take 1 tablet (80 mg total) by mouth daily at 6 PM. 30 tablet 6  . carvedilol (COREG) 12.5 MG tablet TAKE ONE TABLET BY MOUTH TWICE DAILY 60 tablet 0  . clopidogrel (PLAVIX) 75 MG tablet Take 1 tablet (75 mg total) by mouth daily. 90 tablet 3  . isosorbide mononitrate (IMDUR) 30 MG 24 hr tablet Take 1 tablet (30 mg total) by mouth daily. 90 tablet 3  . lisinopril (PRINIVIL,ZESTRIL) 20 MG tablet TAKE ONE TABLET BY MOUTH TWICE DAILY 60 tablet 1  . nitroGLYCERIN (NITROSTAT) 0.4 MG SL tablet Place 1 tablet (0.4 mg total) under the tongue every 5 (five) minutes as needed. 25 tablet 0  . Omega-3 Fatty Acids (FISH OIL) 1000 MG CAPS Take 1,000 mg by mouth 2 (two) times daily.    . vitamin E 1000 UNIT capsule Take 1,000  Units by mouth daily.       No current facility-administered medications for this visit.    Allergies:  Aspirin; Other; and Penicillins   Social History: The patient  reports that he quit smoking about 18 years ago. His smoking use included Cigarettes. He has a 90 pack-year smoking history. He has never used smokeless tobacco. He reports that he does not drink alcohol or use illicit drugs.   ROS:  Please see the history of present illness. Otherwise, complete review of systems is positive for mild arthritic pains.  All other systems are reviewed and negative.   Physical Exam: VS:  BP 154/70 mmHg  Pulse 68   Ht 5\' 8"  (1.727 m)  Wt 236 lb (107.049 kg)  BMI 35.89 kg/m2  SpO2 96%, BMI Body mass index is 35.89 kg/(m^2).  Wt Readings from Last 3 Encounters:  04/23/15 236 lb (107.049 kg)  09/18/14 244 lb (110.678 kg)  06/12/14 236 lb (107.049 kg)     Comfortable at rest. HEENT: Conjunctiva and lids normal, oropharynx clear.  Neck: Supple, no elevated jugular venous pressure or carotid bruits.  Lungs: Clear to auscultation, nonlabored.  Cardiac: Regular rate and rhythm, no S3.  Abdomen: Soft, nontender, no bruits.  Extremities: No pitting edema, distal pulses one plus. Small firm knot proximal to his radial artery catheterization site on the right, nontender. Skin: Warm and dry.  Musculoskeletal: No kyphosis.  Neuropsychiatric: Alert and oriented x3, affect appropriate.   ECG: ECG is ordered today.  Recent Labwork: 05/19/2014: BUN 11; Creatinine, Ser 0.91; Hemoglobin 11.1*; Platelets 200; Potassium 4.1; Sodium 142     Component Value Date/Time   CHOL 130 04/20/2013 1140   TRIG 113 04/20/2013 1140   HDL 30* 04/20/2013 1140   CHOLHDL 4.3 04/20/2013 1140   VLDL 23 04/20/2013 1140   LDLCALC 77 04/20/2013 1140    ASSESSMENT AND PLAN:  1. Symptomatically stable CAD status post DES to the first diagonal and RCA in November 2015. Previously placed stents from 2009 were patent. He continues to exercise at the [facility], we will continue medical therapy and observation for now.  2. Hyperlipidemia, on statin therapy. Requesting most recent lab work from [place].  3. Essential hypertension, blood pressure is mildly elevated today. Continue to follow with primary care. May need further up titration of medical therapy over time.  Current medicines were reviewed at length with the patient today.   Orders Placed This Encounter  Procedures  . EKG 12-Lead    Disposition: FU with me in 6 months.   Signed, Satira Sark, MD, [facility] 04/23/2015 4:07 PM    [facility] at [facility] [address] [place], [place] [address] Phone: [phone]; Fax: [phone]

## 2015-06-19 ENCOUNTER — Other Ambulatory Visit: Payer: Self-pay | Admitting: Cardiology

## 2015-08-16 ENCOUNTER — Other Ambulatory Visit: Payer: Self-pay | Admitting: *Deleted

## 2015-08-16 MED ORDER — ATORVASTATIN CALCIUM 80 MG PO TABS: 80.0000 mg | ORAL_TABLET | Freq: Every day | ORAL | Status: DC

## 2015-09-12 ENCOUNTER — Other Ambulatory Visit: Payer: Self-pay | Admitting: Cardiology

## 2015-11-30 ENCOUNTER — Encounter: Payer: Self-pay | Admitting: Cardiology

## 2015-11-30 ENCOUNTER — Encounter: Payer: Medicare Other | Admitting: Cardiology

## 2015-11-30 NOTE — Progress Notes (Signed)
Patient canceled.  This encounter was created in error - please disregard. 

## 2015-12-03 ENCOUNTER — Encounter: Payer: Self-pay | Admitting: *Deleted

## 2015-12-03 ENCOUNTER — Ambulatory Visit (INDEPENDENT_AMBULATORY_CARE_PROVIDER_SITE_OTHER): Payer: Medicare HMO | Admitting: Adult Health

## 2015-12-03 ENCOUNTER — Encounter: Payer: Self-pay | Admitting: Adult Health

## 2015-12-03 VITALS — BP 146/70 | HR 75 | Ht 68.0 in | Wt 237.0 lb

## 2015-12-03 DIAGNOSIS — I10 Essential (primary) hypertension: Secondary | ICD-10-CM

## 2015-12-03 DIAGNOSIS — I2510 Atherosclerotic heart disease of native coronary artery without angina pectoris: Secondary | ICD-10-CM | POA: Diagnosis not present

## 2015-12-03 NOTE — Patient Instructions (Signed)
Your physician wants you to follow-up in: 1 Year with Dr. McDowell. You will receive a reminder letter in the mail two months in advance. If you don't receive a letter, please call our office to schedule the follow-up appointment.  Your physician recommends that you continue on your current medications as directed. Please refer to the Current Medication list given to you today.  If you need a refill on your cardiac medications before your next appointment, please call your pharmacy.  Thank you for choosing  HeartCare!   

## 2015-12-03 NOTE — Progress Notes (Deleted)
Name: Corey [name]    DOB: 24-Nov-1935  Age: 80 y.o.  MR#: [id number]       PCP:  Rocky Morel, MD      Insurance: Payor: [employer] Falling MEDICARE / Plan: AETNA MEDICARE HMO/PPO / Product Type: *No Product type* /   CC:   No chief complaint on file.   VS Filed Vitals:   12/03/15 1418  BP: 146/70  Pulse: 75  Height: '5\' 8"'$  (1.727 m)  Weight: 237 lb (107.502 kg)  SpO2: 94%    Weights Current Weight  12/03/15 237 lb (107.502 kg)  04/23/15 236 lb (107.049 kg)  09/18/14 244 lb (110.678 kg)    Blood Pressure  BP Readings from Last 3 Encounters:  12/03/15 146/70  04/23/15 154/70  09/18/14 138/64     Admit date:  (Not on file) Last encounter with RMR:  Visit date not found   Allergy Aspirin; Other; and Penicillins  Current Outpatient Prescriptions  Medication Sig Dispense Refill  . amLODipine (NORVASC) 5 MG tablet TAKE ONE TABLET BY MOUTH ONCE DAILY 90 tablet 3  . Ascorbic Acid (VITAMIN C) 1000 MG tablet Take 1,000 mg by mouth daily.      [name] atorvastatin (LIPITOR) 80 MG tablet Take 1 tablet (80 mg total) by mouth daily at 6 PM. 30 tablet 6  . carvedilol (COREG) 12.5 MG tablet TAKE ONE TABLET BY MOUTH TWICE DAILY 60 tablet 0  . carvedilol (COREG) 12.5 MG tablet TAKE ONE TABLET BY MOUTH TWICE DAILY 180 tablet 0  . clopidogrel (PLAVIX) 75 MG tablet Take 1 tablet (75 mg total) by mouth daily. 90 tablet 3  . isosorbide mononitrate (IMDUR) 30 MG 24 hr tablet Take 1 tablet (30 mg total) by mouth daily. 90 tablet 3  . lisinopril (PRINIVIL,ZESTRIL) 20 MG tablet TAKE ONE TABLET BY MOUTH TWICE DAILY 180 tablet 3  . nitroGLYCERIN (NITROSTAT) 0.4 MG SL tablet Place 1 tablet (0.4 mg total) under the tongue every 5 (five) minutes as needed. 25 tablet 0  . Omega-3 Fatty Acids (FISH OIL) 1000 MG CAPS Take 1,000 mg by mouth 2 (two) times daily.    . vitamin E 1000 UNIT capsule Take 1,000 Units by mouth daily.       No current facility-administered medications for this visit.     Discontinued Meds:   There are no discontinued medications.  Patient Active Problem List   Diagnosis Date Noted  . Exertional angina ([place]) 05/18/2014  . Abnormal myocardial perfusion study 05/16/2014  . Accelerating angina ([place]) 05/16/2014  . Screening for colon cancer 04/10/2011  . Mixed hyperlipidemia 08/09/2009  . Essential hypertension 08/09/2009  . CORONARY ATHEROSCLEROSIS NATIVE CORONARY ARTERY 08/09/2009    LABS    Component Value Date/Time   NA 142 05/19/2014 0352   NA 141 05/18/2014 0900   NA 141 04/28/2008 0425   K 4.1 05/19/2014 0352   K 4.2 05/18/2014 0900   K 3.7 04/28/2008 0425   CL 107 05/19/2014 0352   CL 105 05/18/2014 0900   CL 107 04/28/2008 0425   CO2 23 05/19/2014 0352   CO2 22 05/18/2014 0900   CO2 26 04/28/2008 0425   GLUCOSE 151* 05/19/2014 0352   GLUCOSE 110* 05/18/2014 0900   GLUCOSE 104* 04/28/2008 0425   BUN 11 05/19/2014 0352   BUN 12 05/18/2014 0900   BUN 8 04/28/2008 0425   CREATININE 0.91 05/19/2014 0352   CREATININE 1.02 05/18/2014 0900   CREATININE 0.91 04/28/2008 0425   CALCIUM 8.7 05/19/2014  0352   CALCIUM 9.1 05/18/2014 0900   CALCIUM 9.0 04/28/2008 0425   GFRNONAA 80* 05/19/2014 0352   GFRNONAA 69* 05/18/2014 0900   GFRNONAA >60 04/28/2008 0425   GFRAA >90 05/19/2014 0352   GFRAA 80* 05/18/2014 0900   GFRAA  04/28/2008 0425    >60        The eGFR has been calculated using the MDRD equation. This calculation has not been validated in all clinical   CMP     Component Value Date/Time   NA 142 05/19/2014 0352   K 4.1 05/19/2014 0352   CL 107 05/19/2014 0352   CO2 23 05/19/2014 0352   GLUCOSE 151* 05/19/2014 0352   BUN 11 05/19/2014 0352   CREATININE 0.91 05/19/2014 0352   CALCIUM 8.7 05/19/2014 0352   PROT 7.2 04/20/2013 1140   ALBUMIN 4.2 04/20/2013 1140   AST 21 04/20/2013 1140   ALT 22 04/20/2013 1140   ALKPHOS 39 04/20/2013 1140   BILITOT 0.5 04/20/2013 1140   GFRNONAA 80* 05/19/2014 0352   GFRAA >90  05/19/2014 0352       Component Value Date/Time   WBC 5.7 05/19/2014 0352   WBC 5.5 05/18/2014 0900   WBC 5.6 04/28/2008 0425   HGB 11.1* 05/19/2014 0352   HGB 11.7* 05/18/2014 0900   HGB 13.0 04/28/2008 0425   HCT 33.6* 05/19/2014 0352   HCT 35.4* 05/18/2014 0900   HCT 37.9* 04/28/2008 0425   MCV 78.9 05/19/2014 0352   MCV 79.0 05/18/2014 0900   MCV 84.4 04/28/2008 0425    Lipid Panel     Component Value Date/Time   CHOL 130 04/20/2013 1140   TRIG 113 04/20/2013 1140   HDL 30* 04/20/2013 1140   CHOLHDL 4.3 04/20/2013 1140   VLDL 23 04/20/2013 1140   LDLCALC 77 04/20/2013 1140    ABG    Component Value Date/Time   TCO2 22 04/24/2008 1256     No results found for: TSH BNP (last 3 results) No results for input(s): BNP in the last 8760 hours.  ProBNP (last 3 results) No results for input(s): PROBNP in the last 8760 hours.  Cardiac Panel (last 3 results) No results for input(s): CKTOTAL, CKMB, TROPONINI, RELINDX in the last 72 hours.  Iron/TIBC/Ferritin/ %Sat No results found for: IRON, TIBC, FERRITIN, IRONPCTSAT   EKG Orders placed or performed in visit on 04/23/15  . EKG 12-Lead     Prior Assessment and Plan Problem List as of 12/03/2015      Cardiovascular and Mediastinum   Essential hypertension   Last Assessment & Plan 04/20/2013 Office Visit Written 04/20/2013 11:34 AM by Satira Sark, MD    Blood pressure is well-controlled.      CORONARY ATHEROSCLEROSIS NATIVE CORONARY ARTERY   Last Assessment & Plan 06/12/2014 Office Visit Written 06/12/2014  4:39 PM by Satira Sark, MD    Symptomatically stable at this time status post recent percutaneous coronary intervention as outlined above. Continue Plavix (aspirin allergy), and other medical therapy. I have asked him to keep an eye on his blood pressure, and if this trend continues, we will need to further adjust his medications, possibly increase Norvasc. Follow-up arranged within the next 3 months.  He should get back to his regular exercise regimen as well.      Accelerating angina [facility])   Last Assessment & Plan 05/16/2014 Office Visit Written 05/16/2014  1:58 PM by Satira Sark, MD    Progressive over the last few months.  He has been compliant with his medications, and has generally stayed active including exercise at the gym, although is limited at this time. As reviewed above, plan is to proceed with a diagnostic heart catheterization in light of abnormal Cardiolite and progressive symptoms to reassess coronary anatomy for revascularization options. 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 Otherwise continues on long-term Plavix with his remaining medications. Has had no significant improvement on Imdur. Procedures being scheduled with Dr. Burt Knack on Thursday of this week. He will have lab work obtained that morning.      Exertional angina ([place])     Other   Mixed hyperlipidemia   Last Assessment & Plan 06/12/2014 Office Visit Written 06/12/2014  4:40 PM by Satira Sark, MD    Continues on Lipitor.      Screening for colon cancer   Last Assessment & Plan 04/09/2011 Office Visit Written 04/10/2011  1:52 PM by Orvil Feil, NP    80 year old male with no prior colonoscopy, presenting for initial screening. No FH of colorectal cancer. Has no evidence of melena or hematochezia. Reports chronic constipation, seems to be worsening over time. Has taken Miralax intermittently but no real bowel regimen. Not eating high fiber diet.   Proceed with TCS with Dr. Gala Romney in near future: the risks, benefits, and alternatives have been discussed with the patient in detail. The patient states understanding and desires to proceed. Miralax 1 capful daily as needed for BM High fiber diet handout      Abnormal myocardial perfusion study   Last Assessment & Plan 05/16/2014 Office Visit Written 05/16/2014  1:59 PM by Satira Sark, MD    Recent intermediate risk study consistent with mid to  apical anterolateral ischemia as well as scar with peri-infarct ischemia in the inferior wall, LVEF 51%.          Imaging: No results found.

## 2015-12-03 NOTE — Progress Notes (Signed)
Cardiology Office Note   Date:  12/03/2015   ID:  Corey [name], DOB May 16, 1936, MRN [id number]  PCP:  Rocky Morel, MD  Cardiologist:[place]/   Jory Sims, NP   No chief complaint on file.     History of Present Illness: Corey [name] is a 80 y.o. male who presents for assessment and management of coronary artery disease, with history of bare-metal stent to the RCA and circumflex in October 2009, and drug-eluting stent to the first diagonal and RCA in 2015, hypertension, hypelripidemia. The patient was last seen by Dr. Domenic Polite on 04/23/2015,  He is here today without any complaints. He works out at [employer]. Every day, walking on a treadmill and lifting weights. He denies chest pain or dyspnea associated. He does have some calf soreness when walking on the treadmill at an incline. The patient denies any calf pain with walking on flat surfaces. He's not had any complaints of chest pain dyspnea on exertion or dizziness.  The patient has been celibate for approximately 7 years but is now seeing someone. He is not having sex she had but he is questioning whether or not he can take a medication such as Viagra.  Past Medical History  Diagnosis Date  . Coronary atherosclerosis of native coronary artery     BMS to RCA and circumflex 10/09, DES to D1 and RCA 04/2014  . Anaphylaxis     Aspirin  . Hyperlipidemia   . Essential hypertension   . Seasonal allergies   . Myocardial infarction ([place]) 1998  . NSTEMI (non-ST elevated myocardial infarction) ([place]) 03/2008    Past Surgical History  Procedure Laterality Date  . Colonoscopy  05/01/2011    Procedure: COLONOSCOPY;  Surgeon: Daneil Dolin, MD;  [facility];  Service: Endoscopy;  Laterality: N/A;  11:30  . Coronary angioplasty with stent placement  07/1996; 03/2008; 05/18/2014    "?3; ?2; 2"  . Cardiac catheterization      Cath 05/18/2014 DES to D1 and mid RCA  . Left heart catheterization with coronary  angiogram N/A 05/18/2014    Procedure: LEFT HEART CATHETERIZATION WITH CORONARY ANGIOGRAM;  Surgeon: Blane Ohara, MD;  Location: [facility] CATH LAB;  Service: Cardiovascular;  Laterality: N/A;     Current Outpatient Prescriptions  Medication Sig Dispense Refill  . amLODipine (NORVASC) 5 MG tablet TAKE ONE TABLET BY MOUTH ONCE DAILY 90 tablet 3  . Ascorbic Acid (VITAMIN C) 1000 MG tablet Take 1,000 mg by mouth daily.      [name] atorvastatin (LIPITOR) 80 MG tablet Take 1 tablet (80 mg total) by mouth daily at 6 PM. 30 tablet 6  . carvedilol (COREG) 12.5 MG tablet TAKE ONE TABLET BY MOUTH TWICE DAILY 60 tablet 0  . carvedilol (COREG) 12.5 MG tablet TAKE ONE TABLET BY MOUTH TWICE DAILY 180 tablet 0  . clopidogrel (PLAVIX) 75 MG tablet Take 1 tablet (75 mg total) by mouth daily. 90 tablet 3  . isosorbide mononitrate (IMDUR) 30 MG 24 hr tablet Take 1 tablet (30 mg total) by mouth daily. 90 tablet 3  . lisinopril (PRINIVIL,ZESTRIL) 20 MG tablet TAKE ONE TABLET BY MOUTH TWICE DAILY 180 tablet 3  . nitroGLYCERIN (NITROSTAT) 0.4 MG SL tablet Place 1 tablet (0.4 mg total) under the tongue every 5 (five) minutes as needed. 25 tablet 0  . Omega-3 Fatty Acids (FISH OIL) 1000 MG CAPS Take 1,000 mg by mouth 2 (two) times daily.    . vitamin E 1000  UNIT capsule Take 1,000 Units by mouth daily.       No current facility-administered medications for this visit.    Allergies:   Aspirin; Other; and Penicillins    Social History:  The patient  reports that he quit smoking about 19 years ago. His smoking use included Cigarettes. He has a 90 pack-year smoking history. He has never used smokeless tobacco. He reports that he does not drink alcohol or use illicit drugs.   Family History:  The patient's family history is negative for Colon cancer.    ROS: All other systems are reviewed and negative. Unless otherwise mentioned in H&P    PHYSICAL EXAM: VS:  BP 146/70 mmHg  Pulse 75  Ht 5\' 8"  (1.727 m)  Wt 237 lb  (107.502 kg)  BMI 36.04 kg/m2  SpO2 94% , BMI Body mass index is 36.04 kg/(m^2). GEN: Well nourished, well developed, in no acute distress HEENT: normal Neck: no JVD, carotid bruits, or masses Cardiac: RRR no murmurs, rubs, or gallops,no edema  Respiratory:  clear to auscultation bilaterally, normal work of breathing GI: soft, nontender, nondistended, + BS MS: no deformity or atrophy Skin: warm and dry, no rash Neuro:  Strength and sensation are intact Psych: euthymic mood, full affect    Lipid Panel    Component Value Date/Time   CHOL 130 04/20/2013 1140   TRIG 113 04/20/2013 1140   HDL 30* 04/20/2013 1140   CHOLHDL 4.3 04/20/2013 1140   VLDL 23 04/20/2013 1140   LDLCALC 77 04/20/2013 1140      Wt Readings from Last 3 Encounters:  12/03/15 237 lb (107.502 kg)  04/23/15 236 lb (107.049 kg)  09/18/14 244 lb (110.678 kg)     ASSESSMENT AND PLAN:  1.  Coronary artery disease: The patient is completely asymptomatic from a cardiac standpoint. He'll continue carvedilol 12.5 mg twice a day aspirin and Plavix. I've advised him if he chooses he is a medication like Viagra for ED, he is not to take the isosorbide on the same day. I've explained to him that this can cause significant drop in blood pressure. He states that he is afraid to take that medication, as he does have friends who died after taking it. But he also said that the friends had other bad habits. If he should decide to use this medication we would not recommend him being on long-term isosorbide. He wishes to think about it and speak with his primary care physician further. At this time we'll not make any medication changes.  2. Hypertension:blood pressure is very well controlled currently. I will not make any changes at this time.  3. Hypercholesterolemia:the patient is on statin therapy. He has labs drawn by his primary care provider once a year. We'll not order labs at this time.  Current medicines are reviewed at  length with the patient today.    Labs/ tests ordered today include:  No orders of the defined types were placed in this encounter.     Disposition:   FU with cardiology in one year. Sooner if symptomatic.   Signed, Jory Sims, NP  12/03/2015 3:04 PM    [facility] [address] [place], [place] [address] Phone: [phone]; Fax: [phone]

## 2015-12-21 ENCOUNTER — Other Ambulatory Visit: Payer: Self-pay | Admitting: Cardiology

## 2016-02-14 ENCOUNTER — Other Ambulatory Visit: Payer: Self-pay

## 2016-02-14 MED ORDER — ATORVASTATIN CALCIUM 80 MG PO TABS: 80.0000 mg | ORAL_TABLET | Freq: Every day | ORAL | 6 refills | Status: DC

## 2016-04-14 ENCOUNTER — Encounter: Payer: Self-pay | Admitting: Internal Medicine

## 2016-04-19 ENCOUNTER — Other Ambulatory Visit: Payer: Self-pay | Admitting: Cardiology

## 2016-06-18 ENCOUNTER — Other Ambulatory Visit: Payer: Self-pay | Admitting: Cardiology

## 2016-07-18 ENCOUNTER — Other Ambulatory Visit: Payer: Self-pay

## 2016-07-18 MED ORDER — ATORVASTATIN CALCIUM 80 MG PO TABS: 80.0000 mg | ORAL_TABLET | Freq: Every day | ORAL | 6 refills | Status: DC

## 2016-11-13 ENCOUNTER — Other Ambulatory Visit: Payer: Self-pay | Admitting: Cardiology

## 2016-12-18 ENCOUNTER — Encounter (HOSPITAL_COMMUNITY): Payer: Self-pay | Admitting: Emergency Medicine

## 2016-12-18 ENCOUNTER — Emergency Department (HOSPITAL_COMMUNITY)
Admission: EM | Admit: 2016-12-18 | Discharge: 2016-12-19 | Disposition: A | Discharge status: Home / Self Care | Payer: Medicare HMO | Attending: Emergency Medicine | Admitting: Emergency Medicine

## 2016-12-18 DIAGNOSIS — Z87891 Personal history of nicotine dependence: Secondary | ICD-10-CM | POA: Insufficient documentation

## 2016-12-18 DIAGNOSIS — I10 Essential (primary) hypertension: Secondary | ICD-10-CM | POA: Diagnosis not present

## 2016-12-18 DIAGNOSIS — M542 Cervicalgia: Secondary | ICD-10-CM | POA: Diagnosis not present

## 2016-12-18 DIAGNOSIS — I2510 Atherosclerotic heart disease of native coronary artery without angina pectoris: Secondary | ICD-10-CM | POA: Diagnosis not present

## 2016-12-18 DIAGNOSIS — M25511 Pain in right shoulder: Secondary | ICD-10-CM | POA: Insufficient documentation

## 2016-12-18 NOTE — ED Triage Notes (Signed)
Onset this evening right shoulder pain, denies injury, pt has not taken medication this evening, elevated BP, denies other symptoms

## 2016-12-19 NOTE — Discharge Instructions (Signed)
Please read instructions below. Apply ice or heat  to your neck for 20 minutes at a time. You can take tylenol every 6 hours as needed for pain. Schedule an appointment with your family doctor if symptoms persist for follow-up on your pain. Return to the ER for new or concerning symptoms.

## 2016-12-19 NOTE — ED Provider Notes (Signed)
Patient states 2 days ago he started having discomfort in his right neck area that was uncomfortable when he turned his head from left to right that is gone progressively worse. He denies any known injury.  Patient is tender over his right trapezius muscle, he is nontender to palpation over the midline cervical spine, his right shoulder is nontender and he has good range of motion.  Medical screening examination/treatment/procedure(s) were conducted as a shared visit with non-physician practitioner(s) and myself.  I personally evaluated the patient during the encounter.   EKG Interpretation  Date/Time:  Thursday December 18 2016 23:10:03 EDT Ventricular Rate:  64 PR Interval:    QRS Duration: 137 QT Interval:  395 QTC Calculation: 408 R Axis:   -2 Text Interpretation:  Sinus rhythm Left bundle branch block Baseline wander in lead(s) II III aVF V2 V3 V5 T-wave inversion in Inferolateral leads No significant change since last tracing 19 May 2014 Confirmed by Rolland Porter [phone]) on 12/19/2016 12:25:24 AM       Rolland Porter, MD, Barbette Or, MD 12/19/16 0111

## 2016-12-19 NOTE — ED Provider Notes (Signed)
[facility] DEPT Provider Note   CSN: [id number] Arrival date & time: 12/18/16  2100     History   Chief Complaint Chief Complaint  Patient presents with  . Shoulder Pain    HPI Corey [name] is a 81 y.o. male.  Pt w PMHx of HTN, NSTEMI, presents w acute onset of right shoulder/neck pain that began on Tuesday. Pt states initially he felt pain on the right side of his neck when turning his head to the left. The pain worsened this afternoon with increased pain with head movements and some right arm movements. Reports he has not taken anything for pain prior to arrival. Pt states this pain is very much unlike how his previous heart attack felt. He says "it feels like it's in my muscle." Pt denies chest pain, shortness of breath, headache, pain radiating into back or jaw, diaphoresis, no N/T down arms. Patient states he did not take his evening dose of Coreg and lisinopril because he was in pain.      Past Medical History:  Diagnosis Date  . Anaphylaxis    Aspirin  . Coronary atherosclerosis of native coronary artery    BMS to RCA and circumflex 10/09, DES to D1 and RCA 04/2014  . Essential hypertension   . Hyperlipidemia   . Myocardial infarction ([place]) 1998  . NSTEMI (non-ST elevated myocardial infarction) ([place]) 03/2008  . Seasonal allergies     Patient Active Problem List   Diagnosis Date Noted  . Exertional angina ([place]) 05/18/2014  . Abnormal myocardial perfusion study 05/16/2014  . Accelerating angina ([place]) 05/16/2014  . Screening for colon cancer 04/10/2011  . Mixed hyperlipidemia 08/09/2009  . Essential hypertension 08/09/2009  . CORONARY ATHEROSCLEROSIS NATIVE CORONARY ARTERY 08/09/2009    Past Surgical History:  Procedure Laterality Date  . CARDIAC CATHETERIZATION     Cath 05/18/2014 DES to D1 and mid RCA  . COLONOSCOPY  05/01/2011   Procedure: COLONOSCOPY;  Surgeon: Daneil Dolin, MD;  Location: [facility];  Service: Endoscopy;  Laterality: N/A;   11:30  . CORONARY ANGIOPLASTY WITH STENT PLACEMENT  07/1996; 03/2008; 05/18/2014   "?3; ?2; 2"  . LEFT HEART CATHETERIZATION WITH CORONARY ANGIOGRAM N/A 05/18/2014   Procedure: LEFT HEART CATHETERIZATION WITH CORONARY ANGIOGRAM;  Surgeon: Blane Ohara, MD;  Location: [facility] CATH LAB;  Service: Cardiovascular;  Laterality: N/A;       Home Medications    Prior to Admission medications   Medication Sig Start Date End Date Taking? Authorizing Provider  amLODipine (NORVASC) 5 MG tablet TAKE ONE TABLET BY MOUTH ONCE DAILY 04/21/16   Satira Sark, MD  Ascorbic Acid (VITAMIN C) 1000 MG tablet Take 1,000 mg by mouth daily.      [provider]  atorvastatin (LIPITOR) 80 MG tablet Take 1 tablet (80 mg total) by mouth daily at 6 PM. 07/18/16   Satira Sark, MD  carvedilol (COREG) 12.5 MG tablet TAKE ONE TABLET BY MOUTH TWICE DAILY 04/22/14   Satira Sark, MD  carvedilol (COREG) 12.5 MG tablet TAKE ONE TABLET BY MOUTH TWICE DAILY 12/21/15   Satira Sark, MD  clopidogrel (PLAVIX) 75 MG tablet Take 1 tablet (75 mg total) by mouth daily. 05/19/14   Almyra Deforest, PA  clopidogrel (PLAVIX) 75 MG tablet TAKE ONE TABLET BY MOUTH ONCE DAILY 11/13/16   Satira Sark, MD  isosorbide mononitrate (IMDUR) 30 MG 24 hr tablet Take 1 tablet (30 mg total) by mouth daily. 05/04/14  Herminio Commons, MD  lisinopril (PRINIVIL,ZESTRIL) 20 MG tablet TAKE ONE TABLET BY MOUTH TWICE DAILY 06/18/16   Satira Sark, MD  nitroGLYCERIN (NITROSTAT) 0.4 MG SL tablet Place 1 tablet (0.4 mg total) under the tongue every 5 (five) minutes as needed. 05/19/14   Almyra Deforest, PA  Omega-3 Fatty Acids (FISH OIL) 1000 MG CAPS Take 1,000 mg by mouth 2 (two) times daily.    [provider]  vitamin E 1000 UNIT capsule Take 1,000 Units by mouth daily.      [provider]    Family History Family History  Problem Relation Age of Onset  . Hypertension Unknown   . Colon cancer Neg Hx      Social History Social History  Substance Use Topics  . Smoking status: Former Smoker    Packs/day: 2.00    Years: 45.00    Types: Cigarettes    Quit date: 07/31/1996  . Smokeless tobacco: Never Used  . Alcohol use No     Allergies   Aspirin; Other; and Penicillins   Review of Systems Review of Systems  Constitutional: Negative for diaphoresis.  Eyes: Negative for visual disturbance.  Respiratory: Negative for shortness of breath.   Cardiovascular: Negative for chest pain.  Gastrointestinal: Negative for nausea.  Genitourinary: Negative for frequency.  Musculoskeletal: Positive for myalgias and neck pain.  Skin: Negative for color change.  Neurological: Negative for dizziness, numbness and headaches.  Psychiatric/Behavioral: Negative for confusion.     Physical Exam Updated Vital Signs BP (!) 166/105 (BP Location: Left Arm)   Pulse 72   Temp 98.1 F (36.7 C)   Resp 20   Ht 5\' 8"  (1.727 m)   SpO2 97%   Physical Exam  Constitutional: He appears well-developed and well-nourished. No distress.  Patient is well-appearing  HENT:  Head: Normocephalic and atraumatic.  Eyes: Conjunctivae are normal.  Neck: Normal range of motion. Neck supple.  Cardiovascular: Normal rate, regular rhythm and intact distal pulses.   Pulmonary/Chest: Effort normal and breath sounds normal.  Abdominal: Soft.  Musculoskeletal:  TTP over right trapezius muscle. Normal ROM of shoulder internal/external rotation, forward flexion, abduction. 5/5 strength bilateral upper extremities.  No spinal or paraspinal tenderness.   Neurological: He is alert.  Normal sensation. Intact distal pulses.  Skin: Skin is warm. He is not diaphoretic.  Psychiatric: He has a normal mood and affect. His behavior is normal.  Nursing note and vitals reviewed.    ED Treatments / Results  Labs (all labs ordered are listed, but only abnormal results are displayed) Labs Reviewed - No data to display  EKG  EKG  Interpretation None       Radiology No results found.  Procedures Procedures (including critical care time)  Medications Ordered in ED Medications - No data to display   Initial Impression / Assessment and Plan / ED Course  I have reviewed the triage vital signs and the nursing notes.  Pertinent labs & imaging results that were available during my care of the patient were reviewed by me and considered in my medical decision making (see chart for details).     Patient with tenderness over right trapezius, likely muscle spasm. No spinal or paraspinal tenderness, imaging not indicated at this time. Presentation unlikely of cardiac or pulmonary etiology. Patient hypertensive in ED, however did not take his evening dose of blood pressure medications. Patient is asymptomatic. RICE therapy, tylenol as needed for pain. Pt to follow up with PCP if symptoms  persist.  Patient discussed with and seen by Dr. Rolland Porter.  Discussed results, findings, treatment and follow up. Patient advised of return precautions. Patient verbalized understanding and agreed with plan.   Final Clinical Impressions(s) / ED Diagnoses   Final diagnoses:  Acute pain of right shoulder    New Prescriptions New Prescriptions   No medications on file     [place], [place] N, PA-C 12/19/16 [place], [place], MD 12/19/16 [phone]

## 2016-12-22 ENCOUNTER — Other Ambulatory Visit: Payer: Self-pay | Admitting: Cardiology

## 2017-01-05 ENCOUNTER — Encounter: Payer: Self-pay | Admitting: Cardiology

## 2017-01-05 ENCOUNTER — Ambulatory Visit (INDEPENDENT_AMBULATORY_CARE_PROVIDER_SITE_OTHER): Payer: Medicare HMO | Admitting: Cardiology

## 2017-01-05 VITALS — BP 160/80 | HR 68 | Ht 68.0 in | Wt 238.0 lb

## 2017-01-05 DIAGNOSIS — I25119 Atherosclerotic heart disease of native coronary artery with unspecified angina pectoris: Secondary | ICD-10-CM

## 2017-01-05 DIAGNOSIS — R69 Illness, unspecified: Secondary | ICD-10-CM | POA: Diagnosis not present

## 2017-01-05 DIAGNOSIS — F17201 Nicotine dependence, unspecified, in remission: Secondary | ICD-10-CM | POA: Diagnosis not present

## 2017-01-05 DIAGNOSIS — I10 Essential (primary) hypertension: Secondary | ICD-10-CM

## 2017-01-05 DIAGNOSIS — E782 Mixed hyperlipidemia: Secondary | ICD-10-CM | POA: Diagnosis not present

## 2017-01-05 MED ORDER — CLOPIDOGREL BISULFATE 75 MG PO TABS: 75.0000 mg | ORAL_TABLET | Freq: Every day | ORAL | 3 refills | Status: DC

## 2017-01-05 MED ORDER — LISINOPRIL 20 MG PO TABS: 20.0000 mg | ORAL_TABLET | Freq: Two times a day (BID) | ORAL | 3 refills | Status: DC

## 2017-01-05 MED ORDER — AMLODIPINE BESYLATE 5 MG PO TABS: 5.0000 mg | ORAL_TABLET | Freq: Every day | ORAL | 3 refills | Status: DC

## 2017-01-05 MED ORDER — CARVEDILOL 12.5 MG PO TABS: 12.5000 mg | ORAL_TABLET | Freq: Two times a day (BID) | ORAL | 11 refills | Status: DC

## 2017-01-05 MED ORDER — ATORVASTATIN CALCIUM 80 MG PO TABS: 80.0000 mg | ORAL_TABLET | Freq: Every day | ORAL | 6 refills | Status: DC

## 2017-01-05 MED ORDER — ISOSORBIDE MONONITRATE ER 30 MG PO TB24: 30.0000 mg | ORAL_TABLET | Freq: Every day | ORAL | 3 refills | Status: DC

## 2017-01-05 NOTE — Patient Instructions (Signed)
Your physician wants you to follow-up in: 1 year with Dr Ferne Reus will receive a reminder letter in the mail two months in advance. If you don't receive a letter, please call our office to schedule the follow-up appointment.   Your physician recommends that you continue on your current medications as directed. Please refer to the Current Medication list given to you today.    If you need a refill on your cardiac medications before your next appointment, please call your pharmacy.      No testing or lab work today.      Thank you for choosing [facility] !

## 2017-01-05 NOTE — Progress Notes (Signed)
Cardiology Office Note  Date: 01/05/2017   ID: Corey [name], DOB 08/14/1935, MRN [id number]  PCP: Caren Macadam, MD  Primary Cardiologist: Rozann Lesches, MD   Chief Complaint  Patient presents with  . Coronary Artery Disease    History of Present Illness: Corey [name] is a 81 y.o. male last seen by Ms. Lawrence DNP back in June 2017. He presents for a routine follow-up visit. Reports no angina symptoms or increasing shortness of breath with his activities. He still exercises at the gym, tries to go 5 days a week.  He has an aspirin allergy and continues on Plavix long-term. Most recent cardiac intervention including DES to the first diagonal and RCA in November 2015. At that time he was noted to have continued patency of the stented portions of the circumflex and previously stented RCA from 2009. We did talk about considering a follow-up stress test sometime within the next visit cycle.  I reviewed his current medications which are outlined below. He reports compliance. He states that he is due to see his PCP at [facility] for lab work this year.  I reviewed his recent ECG from June.  Past Medical History:  Diagnosis Date  . Anaphylaxis    Aspirin  . Coronary atherosclerosis of native coronary artery    BMS to RCA and circumflex 10/09, DES to D1 and RCA 04/2014  . Essential hypertension   . Hyperlipidemia   . Myocardial infarction ([place]) 1998  . NSTEMI (non-ST elevated myocardial infarction) ([place]) 03/2008  . Seasonal allergies     Past Surgical History:  Procedure Laterality Date  . CARDIAC CATHETERIZATION     Cath 05/18/2014 DES to D1 and mid RCA  . COLONOSCOPY  05/01/2011   Procedure: COLONOSCOPY;  Surgeon: Daneil Dolin, MD;  [facility];  Service: Endoscopy;  Laterality: N/A;  11:30  . CORONARY ANGIOPLASTY WITH STENT PLACEMENT  07/1996; 03/2008; 05/18/2014   "?3; ?2; 2"  . LEFT HEART CATHETERIZATION WITH CORONARY ANGIOGRAM N/A 05/18/2014     Procedure: LEFT HEART CATHETERIZATION WITH CORONARY ANGIOGRAM;  Surgeon: Blane Ohara, MD;  Location: [facility] CATH LAB;  Service: Cardiovascular;  Laterality: N/A;    Current Outpatient Prescriptions  Medication Sig Dispense Refill  . amLODipine (NORVASC) 5 MG tablet Take 1 tablet (5 mg total) by mouth daily. 90 tablet 3  . Ascorbic Acid (VITAMIN C) 1000 MG tablet Take 1,000 mg by mouth daily.      [name] atorvastatin (LIPITOR) 80 MG tablet Take 1 tablet (80 mg total) by mouth daily at 6 PM. 30 tablet 6  . carvedilol (COREG) 12.5 MG tablet Take 1 tablet (12.5 mg total) by mouth 2 (two) times daily. 60 tablet 11  . clopidogrel (PLAVIX) 75 MG tablet Take 1 tablet (75 mg total) by mouth daily. 90 tablet 3  . isosorbide mononitrate (IMDUR) 30 MG 24 hr tablet Take 1 tablet (30 mg total) by mouth daily. 90 tablet 3  . lisinopril (PRINIVIL,ZESTRIL) 20 MG tablet Take 1 tablet (20 mg total) by mouth 2 (two) times daily. 180 tablet 3  . nitroGLYCERIN (NITROSTAT) 0.4 MG SL tablet Place 1 tablet (0.4 mg total) under the tongue every 5 (five) minutes as needed. 25 tablet 0  . Omega-3 Fatty Acids (FISH OIL) 1000 MG CAPS Take 1,000 mg by mouth 2 (two) times daily.    . vitamin E 1000 UNIT capsule Take 1,000 Units by mouth daily.       No current  facility-administered medications for this visit.    Allergies:  Aspirin; Other; and Penicillins   Social History: The patient  reports that he quit smoking about 20 years ago. His smoking use included Cigarettes. He has a 90.00 pack-year smoking history. He has never used smokeless tobacco. He reports that he does not drink alcohol or use drugs.   ROS:  Please see the history of present illness. Otherwise, complete review of systems is positive for arthritic stiffness.  All other systems are reviewed and negative.   Physical Exam: VS:  BP (!) 160/80   Pulse 68   Ht '5\' 8"'$  (1.727 m)   Wt 238 lb (108 kg)   SpO2 94%   BMI 36.19 kg/m , BMI Body mass index is 36.19  kg/m.  Wt Readings from Last 3 Encounters:  01/05/17 238 lb (108 kg)  12/03/15 237 lb (107.5 kg)  04/23/15 236 lb (107 kg)    Obese male, appears comfortable at rest. HEENT: Conjunctiva and lids normal, oropharynx clear.  Neck: Supple, no elevated jugular venous pressure or carotid bruits.  Lungs: Clear to auscultation, nonlabored.  Cardiac: Regular rate and rhythm, no S3.  Abdomen: Soft, nontender, no bruits.  Extremities: No pitting edema, distal pulses one plus. Skin: Warm and dry.  Musculoskeletal: No kyphosis.  Neuropsychiatric: Alert and oriented x3, affect appropriate.  ECG: I personally reviewed the tracing from 12/18/2016 which showed sinus rhythm with IVCD and repolarization abnormalities.  Recent Labwork:  June 2016: Hemoglobin 12.8, platelets 228, BUN 11, creatinine 0.99, potassium 4.0, AST 21, ALT 22, cholesterol 154, triglycerides 193, HDL 31, LDL 84   Other Studies Reviewed Today:  Cardiac catheterization and PCI 05/18/2014: PROCEDURAL FINDINGS Hemodynamics: AO 117/57 LV 123/17              Coronary angiography: Coronary dominance: right  Left mainstem: The left mainstem is patent with mild calcification and no significant obstruction. The vessel divides into the LAD, intermediate branch, and left circumflex.  Left anterior descending (LAD): The LAD is patent to the apex. There is diffuse calcified 50% mid LAD stenosis essentially unchanged from the previous study. The first diagonal has severe 95% stenosis just before it divides into twin vessels.  Left circumflex (LCx): The circumflex is patent. The stented segment is widely patent without stenosis. The intermediate branch is widely patent. The obtuse marginal branches are patent.  Right coronary artery (RCA): The RCA has mild ostial stenosis of approximately 30-40%. The mid vessel has 75% eccentric stenosis. Further down in the mid vessel the previously stented segment is patent with diffuse 20%  ISR. The distal vessel is patent. The PDA and PLA branches are patent.  Left ventriculography: There is hypokinesis of the basal and midinferior wall. The other LV wall segments contract vigorously. The estimated LVEF is 55-60%.  PCI Note:  Following the diagnostic procedure, the decision was made to proceed with PCI of the diagonal and RCA.  These vessels correlated with the ischemic regions on nuclear stress testing. Weight-based heparin was given for anticoagulation. Once a therapeutic ACT was achieved, a 6 [place] EBU 4.0 cm guide catheter was inserted.  A whisper coronary guidewire was used to cross the lesion in the diagonal.  The lesion was predilated with a 2.0 mm balloon.  The wire was then redirected into the larger more inferior branch of the diagonal. This region was redilated. The lesion was then stented with a 2.25 x 18 mm Xience Alpine DES.  The stent was postdilated with a  2.25 mm noncompliant balloon.  Following PCI, there was 0% residual stenosis and TIMI-3 flow.   Attention was then turned to the RCA. A JR4 guide catheter was used. The same whisper wire was used. The lesion was primarily stented with a 3.25 x 15 mm Xience Alpine DES. The stent was postdilated to 16 atm with a 3.5 mm noncompliant balloon. Final angiography confirmed an excellent result. The patient tolerated the procedure well. There were no immediate procedural complications. A TR band was used for radial hemostasis. The patient was transferred to the post catheterization recovery area for further monitoring.  PCI Data: Lesion 1 Vessel - Diagonal 1/Segment - proximal Percent Stenosis (pre)  99 TIMI-flow 3 Stent 2.25x18 mm Xience DES Percent Stenosis (post) 0 TIMI-flow (post) 3  Lesion 2: Vessel - RCA/Segment - mid Percent Stenosis (pre)  75 TIMI-flow 3 Stent 3.25x15 mm Xience DES Percent Stenosis (post) 0 TIMI-flow (post) 3  Assessment and Plan:  1. Symptomatically stable CAD status post BMS to the  RCA and circumflex in 2009, and more recently DES to the first diagonal and RCA in 2015. He reports no angina or increasing shortness of breath with typical activities including regular exercise. We will continue with observation, likely consider a follow-up Myoview sometime in the next visit cycle. He will let us know if symptoms intervene sooner. Continue medical therapy.  2. Hyperlipidemia, continues on high-dose Lipitor. I recommended that he follow-up with his PCP for follow-up lipid panel. He reports no side effects.  3. Essential hypertension, blood pressure is elevated today. Reports compliance with his medications. Discussed weight loss, continued exercise, salt restriction. Keep follow-up with PCP.  4. Tobacco abuse in remission.  Current medicines were reviewed with the patient today.  Disposition: Follow-up in one year, sooner if needed.  Signed, Satira Sark, MD, [facility] 01/05/2017 9:07 AM    [facility] [place]. [address] [place], [place] [address] Phone: [phone]; Fax: [phone]

## 2017-01-15 ENCOUNTER — Other Ambulatory Visit: Payer: Self-pay | Admitting: Cardiology

## 2017-04-15 DIAGNOSIS — H52223 Regular astigmatism, bilateral: Secondary | ICD-10-CM | POA: Diagnosis not present

## 2017-04-15 DIAGNOSIS — H5203 Hypermetropia, bilateral: Secondary | ICD-10-CM | POA: Diagnosis not present

## 2017-04-15 DIAGNOSIS — H18413 Arcus senilis, bilateral: Secondary | ICD-10-CM | POA: Diagnosis not present

## 2017-04-15 DIAGNOSIS — H524 Presbyopia: Secondary | ICD-10-CM | POA: Diagnosis not present

## 2017-07-10 DIAGNOSIS — Z880 Allergy status to penicillin: Secondary | ICD-10-CM | POA: Diagnosis not present

## 2017-07-10 DIAGNOSIS — Z809 Family history of malignant neoplasm, unspecified: Secondary | ICD-10-CM | POA: Diagnosis not present

## 2017-07-10 DIAGNOSIS — I10 Essential (primary) hypertension: Secondary | ICD-10-CM | POA: Diagnosis not present

## 2017-07-10 DIAGNOSIS — Z87891 Personal history of nicotine dependence: Secondary | ICD-10-CM | POA: Diagnosis not present

## 2017-07-10 DIAGNOSIS — Z7902 Long term (current) use of antithrombotics/antiplatelets: Secondary | ICD-10-CM | POA: Diagnosis not present

## 2017-07-10 DIAGNOSIS — E669 Obesity, unspecified: Secondary | ICD-10-CM | POA: Diagnosis not present

## 2017-07-10 DIAGNOSIS — E785 Hyperlipidemia, unspecified: Secondary | ICD-10-CM | POA: Diagnosis not present

## 2017-07-10 DIAGNOSIS — Z6836 Body mass index (BMI) 36.0-36.9, adult: Secondary | ICD-10-CM | POA: Diagnosis not present

## 2017-07-10 DIAGNOSIS — I2510 Atherosclerotic heart disease of native coronary artery without angina pectoris: Secondary | ICD-10-CM | POA: Diagnosis not present

## 2017-07-10 DIAGNOSIS — I252 Old myocardial infarction: Secondary | ICD-10-CM | POA: Diagnosis not present

## 2017-08-26 ENCOUNTER — Other Ambulatory Visit: Payer: Self-pay | Admitting: Cardiology

## 2017-10-09 DIAGNOSIS — Z1389 Encounter for screening for other disorder: Secondary | ICD-10-CM | POA: Diagnosis not present

## 2017-10-09 DIAGNOSIS — E785 Hyperlipidemia, unspecified: Secondary | ICD-10-CM | POA: Diagnosis not present

## 2017-10-09 DIAGNOSIS — J301 Allergic rhinitis due to pollen: Secondary | ICD-10-CM | POA: Diagnosis not present

## 2017-10-09 DIAGNOSIS — Z6836 Body mass index (BMI) 36.0-36.9, adult: Secondary | ICD-10-CM | POA: Diagnosis not present

## 2017-10-09 DIAGNOSIS — E6609 Other obesity due to excess calories: Secondary | ICD-10-CM | POA: Diagnosis not present

## 2018-01-18 ENCOUNTER — Other Ambulatory Visit: Payer: Self-pay | Admitting: Cardiology

## 2018-01-19 NOTE — Progress Notes (Signed)
Cardiology Office Note  Date: 01/20/2018   ID: Corey [name], DOB 10/29/1935, MRN [id number]  PCP: [place]  Primary Cardiologist: Rozann Lesches, MD   Chief Complaint  Patient presents with  . Coronary Artery Disease    History of Present Illness: Corey [name] is a 82 y.o. male last seen in July 2018.  He is here for a routine follow-up visit.  He does not report any obvious angina symptoms or nitroglycerin use since last encounter.  Not exercising regularly at this time, but he has been taking care of several grandchildren and great-grandchildren over the summer.  He reports NYHA class II dyspnea, no palpitations or syncope.  Interval lab work is outlined below.  He remains on a stable cardiac medical regimen as outlined below.  I asked him to keep an eye on his blood pressure, systolic in the 812X today.  He continues to follow with [facility].  We discussed follow-up stress testing.  Last evaluation was in 2015.  I personally reviewed his ECG today which shows a sinus rhythm with IVCD and repolarization abnormalities.  Past Medical History:  Diagnosis Date  . Anaphylaxis    Aspirin  . Coronary atherosclerosis of native coronary artery    BMS to RCA and circumflex 10/09, DES to D1 and RCA 04/2014  . Essential hypertension   . Hyperlipidemia   . Myocardial infarction ([place]) 1998  . NSTEMI (non-ST elevated myocardial infarction) ([place]) 03/2008  . Seasonal allergies     Past Surgical History:  Procedure Laterality Date  . CARDIAC CATHETERIZATION     Cath 05/18/2014 DES to D1 and mid RCA  . COLONOSCOPY  05/01/2011   Procedure: COLONOSCOPY;  Surgeon: Daneil Dolin, MD;  [facility];  Service: Endoscopy;  Laterality: N/A;  11:30  . CORONARY ANGIOPLASTY WITH STENT PLACEMENT  07/1996; 03/2008; 05/18/2014   "?3; ?2; 2"  . LEFT HEART CATHETERIZATION WITH CORONARY ANGIOGRAM N/A 05/18/2014   Procedure: LEFT HEART CATHETERIZATION WITH  CORONARY ANGIOGRAM;  Surgeon: Blane Ohara, MD;  Location: [facility] CATH LAB;  Service: Cardiovascular;  Laterality: N/A;    Current Outpatient Medications  Medication Sig Dispense Refill  . amLODipine (NORVASC) 5 MG tablet Take 1 tablet (5 mg total) by mouth daily. 90 tablet 3  . Ascorbic Acid (VITAMIN C) 1000 MG tablet Take 1,000 mg by mouth daily.      [name] atorvastatin (LIPITOR) 80 MG tablet TAKE 1 TABLET BY MOUTH ONCE DAILY AT  6  P.M. 90 tablet 3  . carvedilol (COREG) 12.5 MG tablet Take 1 tablet (12.5 mg total) by mouth 2 (two) times daily. 180 tablet 3  . clopidogrel (PLAVIX) 75 MG tablet Take 1 tablet (75 mg total) by mouth daily. 90 tablet 3  . isosorbide mononitrate (IMDUR) 30 MG 24 hr tablet Take 1 tablet (30 mg total) by mouth daily. 90 tablet 3  . lisinopril (PRINIVIL,ZESTRIL) 20 MG tablet Take 1 tablet (20 mg total) by mouth 2 (two) times daily. 180 tablet 3  . nitroGLYCERIN (NITROSTAT) 0.4 MG SL tablet Place 1 tablet (0.4 mg total) under the tongue every 5 (five) minutes as needed. 25 tablet 0  . Omega-3 Fatty Acids (FISH OIL) 1000 MG CAPS Take 1,000 mg by mouth 2 (two) times daily.    . vitamin E 1000 UNIT capsule Take 1,000 Units by mouth daily.       No current facility-administered medications for this visit.    Allergies:  Aspirin; Other;  and Penicillins   Social History: The patient  reports that he quit smoking about 21 years ago. His smoking use included cigarettes. He has a 90.00 pack-year smoking history. He has never used smokeless tobacco. He reports that he does not drink alcohol or use drugs.   ROS:  Please see the history of present illness. Otherwise, complete review of systems is positive for hearing loss.  All other systems are reviewed and negative.   Physical Exam: VS:  BP (!) 164/74   Pulse 62   Ht _0  (1.727 m)   Wt 240 lb (108.9 kg)   SpO2 95% Comment: on room air  BMI 36.49 kg/m , BMI Body mass index is 36.49 kg/m.  Wt Readings from Last 3  Encounters:  01/20/18 240 lb (108.9 kg)  01/05/17 238 lb (108 kg)  12/03/15 237 lb (107.5 kg)    General: Obese male, appears comfortable at rest. HEENT: Conjunctiva and lids normal, oropharynx clear. Neck: Supple, no elevated JVP or carotid bruits, no thyromegaly. Lungs: Clear to auscultation, nonlabored breathing at rest. Cardiac: Regular rate and rhythm, no S3, 2/6 systolic murmur. Abdomen: Soft, nontender, bowel sounds present. Extremities: No pitting edema, distal pulses 2+. Skin: Warm and dry. Musculoskeletal: No kyphosis. Neuropsychiatric: Alert and oriented x3, affect grossly appropriate.  ECG: I personally reviewed the tracing from 12/18/2016 which showed sinus rhythm with IVCD and repolarization abnormalities.  Recent Labwork:  April 2019: Hgb 12.1, platelets 201, BUN 12, creatinine 1.26, potassium 4.2,  AST 23. ALT 20, cholesterol 146, TG 132, HDL 31, LDL 89  Other Studies Reviewed Today:  Cardiac catheterization and PCI 05/18/2014: PROCEDURAL FINDINGS Hemodynamics: AO 117/57 LV 123/17  Coronary angiography: Coronary dominance: right  Left mainstem:The left mainstem is patent with mild calcification and no significant obstruction. The vessel divides into the LAD, intermediate branch, and left circumflex.  Left anterior descending (LAD):The LAD is patent to the apex. There is diffuse calcified 50% mid LAD stenosis essentially unchanged from the previous study. The first diagonal has severe 95% stenosis just before it divides into twin vessels.  Left circumflex (LCx):The circumflex is patent. The stented segment is widely patent without stenosis. The intermediate branch is widely patent. The obtuse marginal branches are patent.  Right coronary artery (RCA):The RCA has mild ostial stenosis of approximately 30-40%. The mid vessel has 75% eccentric stenosis. Further down in the mid vessel the previously stented segment is patent with diffuse 20% ISR. The distal  vessel is patent. The PDA and PLA branches are patent.  Left ventriculography: There is hypokinesis of the basal and midinferior wall. The other LV wall segments contract vigorously. The estimated LVEF is 55-60%.  PCI Note: Following the diagnostic procedure, the decision was made to proceed with PCIof the diagonal and RCA. These vessels correlated with the ischemic regions on nuclear stress testing. Weight-based heparinwas given for anticoagulation. Once a therapeutic ACT was achieved, a 6 [place] EBU 4.0 cmguide catheter was inserted. A whispercoronary guidewire was used to cross the lesionin the diagonal. The lesion was predilated with a 2.0 mmballoon. The wire was then redirected into the larger more inferior branch of the diagonal. This region was redilated. The lesion was then stented with a 2.25 x 18 mm Xience Alpine DES. The stent was postdilated with a 2.25 mmnoncompliant balloon. Following PCI, there was 0% residual stenosis and TIMI-3 flow.   Attention was then turned to the RCA. A JR4 guide catheter was used. The same whisper wire was used. The lesion  was primarily stented with a 3.25 x 15 mm Xience Alpine DES. The stent was postdilated to 16 atm with a 3.5 mm noncompliant balloon. Final angiography confirmed an excellent result. The patient tolerated the procedure well. There were no immediate procedural complications. A TR band was used for radial hemostasis. The patient was transferred to the post catheterization recovery area for further monitoring.  PCI Data: Lesion 1 Vessel - Diagonal 1/Segment - proximal Percent Stenosis (pre) 99 TIMI-flow 3 Stent 2.25x18 mm Xience DES Percent Stenosis (post) 0 TIMI-flow (post) 3  Lesion 2: Vessel - RCA/Segment - mid Percent Stenosis (pre) 75 TIMI-flow 3 Stent 3.25x15 mm Xience DES Percent Stenosis (post) 0 TIMI-flow (post) 3  Assessment and Plan:  1.  CAD status post BMS to the RCA and circumflex in 2009 as well as  DES to the first diagonal and RCA in 2015.  Plan is to obtain a follow-up Lexiscan Myoview on medical therapy to reevaluate ischemic burden.  ECG reviewed and stable.  2.  Mixed hyperlipidemia, remains on Lipitor without side effects.  Last LDL 89.  3.  Essential hypertension, blood pressure elevated today with systolic in the 269S.  He reports compliance with his medications.  I encouraged him to keep an eye on this and follow-up with [facility].  4.  Tobacco abuse in remission.  Current medicines were reviewed with the patient today.   Orders Placed This Encounter  Procedures  . NM Myocar Multi W/Spect W/Wall Motion / EF  . EKG 12-Lead    Disposition: Follow-up in 1 year.  Signed, Satira Sark, MD, [facility] 01/20/2018 1:15 PM    [facility] at [facility] [address], [place], [place] [address] Phone: [phone]; Fax: [phone]

## 2018-01-20 ENCOUNTER — Ambulatory Visit: Payer: Medicare HMO | Admitting: Cardiology

## 2018-01-20 ENCOUNTER — Encounter: Payer: Self-pay | Admitting: *Deleted

## 2018-01-20 VITALS — BP 164/74 | HR 62 | Ht 68.0 in | Wt 240.0 lb

## 2018-01-20 DIAGNOSIS — I10 Essential (primary) hypertension: Secondary | ICD-10-CM

## 2018-01-20 DIAGNOSIS — I25119 Atherosclerotic heart disease of native coronary artery with unspecified angina pectoris: Secondary | ICD-10-CM | POA: Diagnosis not present

## 2018-01-20 DIAGNOSIS — F17201 Nicotine dependence, unspecified, in remission: Secondary | ICD-10-CM

## 2018-01-20 DIAGNOSIS — E782 Mixed hyperlipidemia: Secondary | ICD-10-CM

## 2018-01-20 DIAGNOSIS — R69 Illness, unspecified: Secondary | ICD-10-CM | POA: Diagnosis not present

## 2018-01-20 MED ORDER — ATORVASTATIN CALCIUM 80 MG PO TABS: ORAL_TABLET | ORAL | 3 refills | Status: DC

## 2018-01-20 MED ORDER — CARVEDILOL 12.5 MG PO TABS: 12.5000 mg | ORAL_TABLET | Freq: Two times a day (BID) | ORAL | 3 refills | Status: DC

## 2018-01-20 MED ORDER — LISINOPRIL 20 MG PO TABS: 20.0000 mg | ORAL_TABLET | Freq: Two times a day (BID) | ORAL | 3 refills | Status: DC

## 2018-01-20 MED ORDER — CLOPIDOGREL BISULFATE 75 MG PO TABS: 75.0000 mg | ORAL_TABLET | Freq: Every day | ORAL | 3 refills | Status: DC

## 2018-01-20 MED ORDER — AMLODIPINE BESYLATE 5 MG PO TABS: 5.0000 mg | ORAL_TABLET | Freq: Every day | ORAL | 3 refills | Status: DC

## 2018-01-20 MED ORDER — ISOSORBIDE MONONITRATE ER 30 MG PO TB24: 30.0000 mg | ORAL_TABLET | Freq: Every day | ORAL | 3 refills | Status: DC

## 2018-01-20 NOTE — Patient Instructions (Addendum)
Medication Instructions:    Your physician recommends that you continue on your current medications as directed. Please refer to the Current Medication list given to you today.  Labwork:  NONE  Testing/Procedures: Your physician has requested that you have a lexiscan myoview. For further information please visit [email or web address]. Please follow instruction sheet, as given.  Follow-Up:  Your physician recommends that you schedule a follow-up appointment in: 12 months. You will receive a reminder letter in the mail in about 10 months reminding you to call and schedule your appointment. If you don't receive this letter, please contact our office.  Any Other Special Instructions Will Be Listed Below (If Applicable).  If you need a refill on your cardiac medications before your next appointment, please call your pharmacy.

## 2018-03-09 ENCOUNTER — Encounter (HOSPITAL_COMMUNITY): Payer: Self-pay

## 2018-03-09 ENCOUNTER — Encounter (HOSPITAL_BASED_OUTPATIENT_CLINIC_OR_DEPARTMENT_OTHER)
Admission: RE | Admit: 2018-03-09 | Discharge: 2018-03-09 | Disposition: A | Discharge status: Home / Self Care | Payer: Medicare HMO | Source: Ambulatory Visit | Attending: Cardiology | Admitting: Cardiology

## 2018-03-09 ENCOUNTER — Encounter (HOSPITAL_COMMUNITY)
Admission: RE | Admit: 2018-03-09 | Discharge: 2018-03-09 | Disposition: A | Discharge status: Home / Self Care | Payer: Medicare HMO | Source: Ambulatory Visit | Attending: Cardiology | Admitting: Cardiology

## 2018-03-09 DIAGNOSIS — I25119 Atherosclerotic heart disease of native coronary artery with unspecified angina pectoris: Secondary | ICD-10-CM

## 2018-03-09 LAB — NM MYOCAR MULTI W/SPECT W/WALL MOTION / EF
LHR: 0.36
LVDIAVOL: 102 mL (ref 62–150)
LVSYSVOL: 57 mL
Peak HR: 84 {beats}/min
Rest HR: 57 {beats}/min
SDS: 2
SRS: 14
SSS: 16
TID: 1.12

## 2018-03-09 MED ORDER — REGADENOSON 0.4 MG/5ML IV SOLN
INTRAVENOUS | Status: AC
  Administered 2018-03-09: 0.4 mg via INTRAVENOUS
  Filled 2018-03-09: qty 5

## 2018-03-09 MED ORDER — TECHNETIUM TC 99M TETROFOSMIN IV KIT
10.0000 | PACK | Freq: Once | INTRAVENOUS | Status: AC | PRN
  Administered 2018-03-09: 8.05 via INTRAVENOUS

## 2018-03-09 MED ORDER — SODIUM CHLORIDE 0.9% FLUSH
INTRAVENOUS | Status: AC
  Administered 2018-03-09: 10 mL via INTRAVENOUS
  Filled 2018-03-09: qty 10

## 2018-03-09 MED ORDER — TECHNETIUM TC 99M TETROFOSMIN IV KIT
30.0000 | PACK | Freq: Once | INTRAVENOUS | Status: AC | PRN
  Administered 2018-03-09: 27.2 via INTRAVENOUS

## 2018-03-10 ENCOUNTER — Telehealth: Payer: Self-pay

## 2018-03-10 DIAGNOSIS — R931 Abnormal findings on diagnostic imaging of heart and coronary circulation: Secondary | ICD-10-CM

## 2018-03-10 NOTE — Telephone Encounter (Signed)
-----   Message from Satira Sark, MD sent at 03/10/2018  8:21 AM EDT ----- Results reviewed.  Study is consistent with previous infarct scar but no active ischemia.  Recommend continuing medical therapy.  I would suggest getting an echocardiogram as well to better clarify LVEF, please get this scheduled at [facility]. A copy of this test should be forwarded to [employer], [facility].

## 2018-03-15 ENCOUNTER — Ambulatory Visit (HOSPITAL_COMMUNITY): Payer: Medicare HMO | Attending: Cardiology

## 2018-03-19 ENCOUNTER — Ambulatory Visit (HOSPITAL_COMMUNITY)
Admission: RE | Admit: 2018-03-19 | Discharge: 2018-03-19 | Disposition: A | Discharge status: Home / Self Care | Payer: Medicare HMO | Source: Ambulatory Visit | Attending: Cardiology | Admitting: Cardiology

## 2018-03-19 DIAGNOSIS — I080 Rheumatic disorders of both mitral and aortic valves: Secondary | ICD-10-CM | POA: Insufficient documentation

## 2018-03-19 DIAGNOSIS — I2510 Atherosclerotic heart disease of native coronary artery without angina pectoris: Secondary | ICD-10-CM | POA: Insufficient documentation

## 2018-03-19 DIAGNOSIS — I119 Hypertensive heart disease without heart failure: Secondary | ICD-10-CM | POA: Diagnosis not present

## 2018-03-19 DIAGNOSIS — E782 Mixed hyperlipidemia: Secondary | ICD-10-CM | POA: Insufficient documentation

## 2018-03-19 DIAGNOSIS — R931 Abnormal findings on diagnostic imaging of heart and coronary circulation: Secondary | ICD-10-CM | POA: Diagnosis not present

## 2018-03-19 NOTE — Progress Notes (Signed)
*  PRELIMINARY RESULTS* Echocardiogram 2D Echocardiogram has been performed.  Corey [name] 03/19/2018, 1:35 PM

## 2018-03-24 ENCOUNTER — Telehealth: Payer: Self-pay | Admitting: Cardiology

## 2018-03-24 NOTE — Telephone Encounter (Signed)
Pt stopped in to go over meds, reassured him he takes imdur

## 2018-03-24 NOTE — Telephone Encounter (Signed)
1433 hrs: Attempt to reach, [id number]

## 2018-03-24 NOTE — Telephone Encounter (Signed)
Pt is wanting to speak w/ the nurse about his medication.

## 2019-01-19 ENCOUNTER — Other Ambulatory Visit: Payer: Self-pay | Admitting: Cardiology

## 2019-03-08 DIAGNOSIS — E7849 Other hyperlipidemia: Secondary | ICD-10-CM | POA: Diagnosis not present

## 2019-03-16 ENCOUNTER — Other Ambulatory Visit: Payer: Self-pay | Admitting: Cardiology

## 2019-04-18 ENCOUNTER — Other Ambulatory Visit (HOSPITAL_COMMUNITY)
Admission: RE | Admit: 2019-04-18 | Discharge: 2019-04-18 | Disposition: A | Discharge status: Home / Self Care | Payer: Medicare HMO | Source: Ambulatory Visit | Attending: Cardiology | Admitting: Cardiology

## 2019-04-18 ENCOUNTER — Other Ambulatory Visit: Payer: Self-pay

## 2019-04-18 ENCOUNTER — Ambulatory Visit (INDEPENDENT_AMBULATORY_CARE_PROVIDER_SITE_OTHER): Payer: Medicare HMO | Admitting: Cardiology

## 2019-04-18 ENCOUNTER — Encounter: Payer: Self-pay | Admitting: Cardiology

## 2019-04-18 VITALS — BP 153/80 | HR 85 | Temp 96.9°F | Ht 68.0 in | Wt 265.0 lb

## 2019-04-18 DIAGNOSIS — I25119 Atherosclerotic heart disease of native coronary artery with unspecified angina pectoris: Secondary | ICD-10-CM | POA: Diagnosis not present

## 2019-04-18 DIAGNOSIS — E785 Hyperlipidemia, unspecified: Secondary | ICD-10-CM | POA: Diagnosis not present

## 2019-04-18 DIAGNOSIS — I10 Essential (primary) hypertension: Secondary | ICD-10-CM

## 2019-04-18 DIAGNOSIS — E782 Mixed hyperlipidemia: Secondary | ICD-10-CM

## 2019-04-18 DIAGNOSIS — I2510 Atherosclerotic heart disease of native coronary artery without angina pectoris: Secondary | ICD-10-CM | POA: Diagnosis not present

## 2019-04-18 LAB — LIPID PANEL
Cholesterol: 158 mg/dL (ref 0–200)
HDL: 35 mg/dL — ABNORMAL LOW (ref >40)
LDL Cholesterol: 105 mg/dL — ABNORMAL HIGH (ref 0–99)
Total CHOL/HDL Ratio: 4.5 RATIO
Triglycerides: 89 mg/dL (ref <150)
VLDL: 18 mg/dL (ref 0–40)

## 2019-04-18 NOTE — Patient Instructions (Signed)
Medication Instructions:  Your physician recommends that you continue on your current medications as directed. Please refer to the Current Medication list given to you today.  *If you need a refill on your cardiac medications before your next appointment, please call your pharmacy*  Lab Work: Fasting If you have labs (blood work) drawn today and your tests are completely normal, you will receive your results only by: [name] MyChart Message (if you have MyChart) OR . A paper copy in the mail If you have any lab test that is abnormal or we need to change your treatment, we will call you to review the results.  Testing/Procedures: Fasting lipids  Follow-Up: At [facility], you and your health needs are our priority.  As part of our continuing mission to provide you with exceptional heart care, we have created designated Provider Care Teams.  These Care Teams include your primary Cardiologist (physician) and Advanced Practice Providers (APPs -  Physician Assistants and Nurse Practitioners) who all work together to provide you with the care you need, when you need it.  Your next appointment:   12 months  The format for your next appointment:   In Person  Provider:   Rozann Lesches, MD  Other Instructions None    Thank you for choosing [facility] !

## 2019-04-18 NOTE — Progress Notes (Signed)
Cardiology Office Note  Date: 04/18/2019   ID: [name], DOB [birth year], MRN [id number]  PCP:  [facility]  Cardiologist:  Rozann Lesches, MD Electrophysiologist:  None   Chief Complaint  Patient presents with  . Cardiac follow-up    History of Present Illness: Corey [name] is an 83 y.o. male last seen in July 2019.  He presents for a follow-up visit.  He tells me that he has not experienced any angina symptoms or required nitroglycerin in the interim.  He does some walking at a local school track.  Otherwise has been staying around the house mainly during the pandemic.  Follow-up echocardiogram in September 2019 revealed LVEF in the range of 45 to 50% with mild diastolic dysfunction.  Myoview done at that time had reported a lower LVEF and showed evidence of moderate sized anterior infarct scar but no active ischemia.  We have continued medical therapy.  I reviewed his medications which are outlined below.  He has not had a recent lipid panel.  I personally reviewed his ECG today which shows sinus rhythm with stable IVCD and PVC.  Past Medical History:  Diagnosis Date  . Anaphylaxis    Aspirin  . Coronary atherosclerosis of native coronary artery    BMS to RCA and circumflex 10/09, DES to D1 and RCA 04/2014  . Essential hypertension   . Hyperlipidemia   . Myocardial infarction ([place]) 1998  . NSTEMI (non-ST elevated myocardial infarction) ([place]) 03/2008  . Seasonal allergies     Past Surgical History:  Procedure Laterality Date  . CARDIAC CATHETERIZATION     Cath 05/18/2014 DES to D1 and mid RCA  . COLONOSCOPY  05/01/2011   Procedure: COLONOSCOPY;  Surgeon: Daneil Dolin, MD;  Location: [facility];  Service: Endoscopy;  Laterality: N/A;  11:30  . CORONARY ANGIOPLASTY WITH STENT PLACEMENT  07/1996; 03/2008; 05/18/2014   "?3; ?2; 2"  . LEFT HEART CATHETERIZATION WITH CORONARY ANGIOGRAM N/A 05/18/2014   Procedure: LEFT HEART  CATHETERIZATION WITH CORONARY ANGIOGRAM;  Surgeon: Blane Ohara, MD;  Location: [facility] CATH LAB;  Service: Cardiovascular;  Laterality: N/A;    Current Outpatient Medications  Medication Sig Dispense Refill  . amLODipine (NORVASC) 5 MG tablet Take 1 tablet by mouth once daily 90 tablet 0  . Ascorbic Acid (VITAMIN C) 1000 MG tablet Take 1,000 mg by mouth daily.      [name] atorvastatin (LIPITOR) 80 MG tablet TAKE 1 TABLET BY MOUTH ONCE DAILY AT 6:00PM 90 tablet 0  . carvedilol (COREG) 12.5 MG tablet TAKE 1 TABLET BY MOUTH TWICE DAILY - NEEDS FOLLOW UP APPT FOR REFILLS 60 tablet 0  . clopidogrel (PLAVIX) 75 MG tablet TAKE 1 TABLET BY MOUTH ONCE DAILY - NEED FOLLOW UP FOR REFILLS 90 tablet 0  . isosorbide mononitrate (IMDUR) 30 MG 24 hr tablet Take 1 tablet (30 mg total) by mouth daily. 90 tablet 3  . lisinopril (ZESTRIL) 20 MG tablet TAKE 1 TABLET BY MOUTH TWICE DAILY - NEEDS APPT FOR REFILLS 60 tablet 0  . nitroGLYCERIN (NITROSTAT) 0.4 MG SL tablet Place 1 tablet (0.4 mg total) under the tongue every 5 (five) minutes as needed. 25 tablet 0  . Omega-3 Fatty Acids (FISH OIL) 1000 MG CAPS Take 1,000 mg by mouth 2 (two) times daily.    . vitamin E 1000 UNIT capsule Take 1,000 Units by mouth daily.       No current facility-administered medications for this visit.  Allergies:  Aspirin, Other, and Penicillins   Social History: The patient  reports that he quit smoking about 22 years ago. His smoking use included cigarettes. He has a 90.00 pack-year smoking history. He has never used smokeless tobacco. He reports that he does not drink alcohol or use drugs.   ROS:  Please see the history of present illness. Otherwise, complete review of systems is positive for none.  All other systems are reviewed and negative.   Physical Exam: VS:  BP (!) 153/80   Pulse 85   Temp (!) 96.9 F (36.1 C) (Temporal)   Ht 5\' 8"  (1.727 m)   Wt 265 lb (120.2 kg)   SpO2 97%   BMI 40.29 kg/m , BMI Body mass index is  40.29 kg/m.  Wt Readings from Last 3 Encounters:  04/18/19 265 lb (120.2 kg)  01/20/18 240 lb (108.9 kg)  01/05/17 238 lb (108 kg)    General: Obese elderly male, appears comfortable at rest. HEENT: Conjunctiva and lids normal, wearing a mask. Neck: Supple, no elevated JVP or carotid bruits, no thyromegaly. Lungs: Clear to auscultation, nonlabored breathing at rest. Cardiac: Regular rate and rhythm, no S3, 2/6 systolic murmur. Abdomen: Soft, nontender, bowel sounds present. Extremities: Trace ankle edema, distal pulses 2+. Skin: Warm and dry. Musculoskeletal: No kyphosis. Neuropsychiatric: Alert and oriented x3, affect grossly appropriate.  ECG:  An ECG dated 01/20/2018 was personally reviewed today and demonstrated:  Sinus rhythm with IVCD and repolarization abnormalities.  Recent Labwork:  April 2019: Hemoglobin 12.1, platelets 201, BUN 12, creatinine 1.26, potassium 4.2, AST 23, ALT 20, cholesterol 146, triglycerides 132, HDL 31, LDL 89  Other Studies Reviewed Today:  Echocardiogram 03/19/2018: Study Conclusions  - Left ventricle: The cavity size was normal. Wall thickness was   increased in a pattern of severe LVH. Systolic function was   mildly reduced. The estimated ejection fraction was in the range   of 45% to 50%. There is akinesis of the basalinferoseptal   myocardium. There is hypokinesis of the mid-apicalanteroseptal   myocardium. Doppler parameters are consistent with abnormal left   ventricular relaxation (grade 1 diastolic dysfunction). - Aortic valve: Mildly calcified annulus. Trileaflet. - Mitral valve: Mildly calcified annulus. There was mild   regurgitation. - Right atrium: Central venous pressure (est): 3 mm Hg. - Atrial septum: No defect or patent foramen ovale was identified. - Tricuspid valve: There was trivial regurgitation. - Pulmonary arteries: Systolic pressure could not be accurately   estimated. - Pericardium, extracardiac: There was no  pericardial effusion.   Lexiscan Myoview 03/09/2018:  There was no ST segment deviation noted during stress.  Findings consistent with prior septal/inferoseptal/inferior/inferoapical myocardial infarction. Evidence of prior moderate anterior infarct. No current ischemia.  This is an intermediate risk study. Risked based on decreased LVEF and scar. No current myocardium at jeopardy.  The left ventricular ejection fraction is moderately decreased (30-44%).  Assessment and Plan:  1.  CAD status post BMS to the RCA and circumflex in 2009 followed by DES to the first diagonal and RCA in 2015.  Myoview from last year showed anterior infarct scar but no ischemia and his LVEF was 45 to 50% by echocardiogram.  Plan to continue current medical regimen and observation.  2.  Mixed hyperlipidemia, he continues on high-dose Lipitor.  Follow-up FLP.  3.  Essential hypertension, systolic is in the [id number] today.  I encouraged walking plan for exercise, keep follow-up with PCP.  Medication Adjustments/Labs and Tests Ordered: Current medicines are reviewed  at length with the patient today.  Concerns regarding medicines are outlined above.   Tests Ordered: Orders Placed This Encounter  Procedures  . Lipid Profile  . EKG 12-Lead    Medication Changes: No orders of the defined types were placed in this encounter.   Disposition:  Follow up 1 year in the [place] office.  Signed, Satira Sark, MD, [facility] 04/18/2019 10:11 AM    [facility] [place], [place], [place] [address] Phone: [phone]; Fax: [phone]

## 2019-04-19 ENCOUNTER — Other Ambulatory Visit: Payer: Self-pay | Admitting: Cardiology

## 2019-04-21 ENCOUNTER — Telehealth: Payer: Self-pay | Admitting: Cardiology

## 2019-04-21 MED ORDER — AMLODIPINE BESYLATE 5 MG PO TABS: 5.0000 mg | ORAL_TABLET | Freq: Every day | ORAL | 3 refills | Status: DC

## 2019-04-21 MED ORDER — ATORVASTATIN CALCIUM 80 MG PO TABS: ORAL_TABLET | ORAL | 3 refills | Status: AC

## 2019-04-21 MED ORDER — LISINOPRIL 20 MG PO TABS: ORAL_TABLET | ORAL | 3 refills | Status: DC

## 2019-04-21 MED ORDER — CLOPIDOGREL BISULFATE 75 MG PO TABS: ORAL_TABLET | ORAL | 3 refills | Status: AC

## 2019-04-21 MED ORDER — CARVEDILOL 12.5 MG PO TABS: ORAL_TABLET | ORAL | 3 refills | Status: DC

## 2019-04-21 NOTE — Telephone Encounter (Signed)
Done

## 2019-04-21 NOTE — Telephone Encounter (Signed)
Please send pt's Rx's in for 90 days supply to [place] states he went the pharmacy to tell them he was seen and they wouldn't give them to him for 90 days.   Please call pt when they've been sent in

## 2019-04-22 ENCOUNTER — Other Ambulatory Visit: Payer: Self-pay | Admitting: Cardiology

## 2019-06-28 ENCOUNTER — Other Ambulatory Visit: Payer: Self-pay | Admitting: Cardiology

## 2019-09-08 ENCOUNTER — Ambulatory Visit: Payer: Medicare HMO | Attending: Internal Medicine

## 2019-09-08 DIAGNOSIS — Z23 Encounter for immunization: Secondary | ICD-10-CM

## 2019-09-08 NOTE — Progress Notes (Signed)
   Covid-19 Vaccination Clinic  Name:  Corey [name]    MRN: [id number] DOB: Aug 01, 1935  09/08/2019  Corey [name] was observed post Covid-19 immunization for 15 minutes without incident. He was provided with Vaccine Information Sheet and instruction to access the V-Safe system.   Corey [name] was instructed to call 911 with any severe reactions post vaccine: [name] Difficulty breathing  . Swelling of face and throat  . A fast heartbeat  . A bad rash all over body  . Dizziness and weakness   Immunizations Administered    Name Date Dose VIS Date Route   Moderna COVID-19 Vaccine 09/08/2019 10:03 AM 0.5 mL 05/31/2019 Intramuscular   Manufacturer: Moderna   Lot: [id number]   [id number]

## 2019-10-09 ENCOUNTER — Encounter (HOSPITAL_COMMUNITY): Payer: Self-pay | Admitting: Emergency Medicine

## 2019-10-09 ENCOUNTER — Emergency Department (HOSPITAL_COMMUNITY): Payer: Medicare HMO

## 2019-10-09 ENCOUNTER — Inpatient Hospital Stay (HOSPITAL_COMMUNITY)
Admission: EM | Admit: 2019-10-09 | Discharge: 2019-10-19 | DRG: 234 | Disposition: A | Discharge status: Home Health | Payer: Medicare HMO | Attending: Surgery | Admitting: Surgery

## 2019-10-09 ENCOUNTER — Other Ambulatory Visit: Payer: Self-pay

## 2019-10-09 DIAGNOSIS — E785 Hyperlipidemia, unspecified: Secondary | ICD-10-CM | POA: Diagnosis present

## 2019-10-09 DIAGNOSIS — Y831 Surgical operation with implant of artificial internal device as the cause of abnormal reaction of the patient, or of later complication, without mention of misadventure at the time of the procedure: Secondary | ICD-10-CM | POA: Diagnosis present

## 2019-10-09 DIAGNOSIS — I214 Non-ST elevation (NSTEMI) myocardial infarction: Secondary | ICD-10-CM | POA: Diagnosis present

## 2019-10-09 DIAGNOSIS — T82855A Stenosis of coronary artery stent, initial encounter: Secondary | ICD-10-CM | POA: Diagnosis present

## 2019-10-09 DIAGNOSIS — Z79899 Other long term (current) drug therapy: Secondary | ICD-10-CM | POA: Diagnosis not present

## 2019-10-09 DIAGNOSIS — D696 Thrombocytopenia, unspecified: Secondary | ICD-10-CM | POA: Diagnosis not present

## 2019-10-09 DIAGNOSIS — J90 Pleural effusion, not elsewhere classified: Secondary | ICD-10-CM

## 2019-10-09 DIAGNOSIS — Z20822 Contact with and (suspected) exposure to covid-19: Secondary | ICD-10-CM | POA: Diagnosis present

## 2019-10-09 DIAGNOSIS — I472 Ventricular tachycardia: Secondary | ICD-10-CM | POA: Diagnosis present

## 2019-10-09 DIAGNOSIS — Z886 Allergy status to analgesic agent status: Secondary | ICD-10-CM | POA: Diagnosis not present

## 2019-10-09 DIAGNOSIS — E7800 Pure hypercholesterolemia, unspecified: Secondary | ICD-10-CM | POA: Diagnosis not present

## 2019-10-09 DIAGNOSIS — D62 Acute posthemorrhagic anemia: Secondary | ICD-10-CM | POA: Diagnosis not present

## 2019-10-09 DIAGNOSIS — N183 Chronic kidney disease, stage 3 unspecified: Secondary | ICD-10-CM | POA: Diagnosis present

## 2019-10-09 DIAGNOSIS — I5042 Chronic combined systolic (congestive) and diastolic (congestive) heart failure: Secondary | ICD-10-CM | POA: Diagnosis present

## 2019-10-09 DIAGNOSIS — I10 Essential (primary) hypertension: Secondary | ICD-10-CM

## 2019-10-09 DIAGNOSIS — Z951 Presence of aortocoronary bypass graft: Secondary | ICD-10-CM

## 2019-10-09 DIAGNOSIS — Z7902 Long term (current) use of antithrombotics/antiplatelets: Secondary | ICD-10-CM | POA: Diagnosis not present

## 2019-10-09 DIAGNOSIS — I25110 Atherosclerotic heart disease of native coronary artery with unstable angina pectoris: Secondary | ICD-10-CM | POA: Diagnosis not present

## 2019-10-09 DIAGNOSIS — Z8249 Family history of ischemic heart disease and other diseases of the circulatory system: Secondary | ICD-10-CM

## 2019-10-09 DIAGNOSIS — I255 Ischemic cardiomyopathy: Secondary | ICD-10-CM | POA: Diagnosis present

## 2019-10-09 DIAGNOSIS — I252 Old myocardial infarction: Secondary | ICD-10-CM | POA: Diagnosis not present

## 2019-10-09 DIAGNOSIS — N179 Acute kidney failure, unspecified: Secondary | ICD-10-CM | POA: Diagnosis not present

## 2019-10-09 DIAGNOSIS — Z87891 Personal history of nicotine dependence: Secondary | ICD-10-CM

## 2019-10-09 DIAGNOSIS — R7303 Prediabetes: Secondary | ICD-10-CM | POA: Diagnosis present

## 2019-10-09 DIAGNOSIS — I130 Hypertensive heart and chronic kidney disease with heart failure and stage 1 through stage 4 chronic kidney disease, or unspecified chronic kidney disease: Secondary | ICD-10-CM | POA: Diagnosis present

## 2019-10-09 DIAGNOSIS — Z01810 Encounter for preprocedural cardiovascular examination: Secondary | ICD-10-CM | POA: Diagnosis not present

## 2019-10-09 DIAGNOSIS — K5900 Constipation, unspecified: Secondary | ICD-10-CM | POA: Diagnosis not present

## 2019-10-09 DIAGNOSIS — I2510 Atherosclerotic heart disease of native coronary artery without angina pectoris: Secondary | ICD-10-CM | POA: Diagnosis present

## 2019-10-09 DIAGNOSIS — E669 Obesity, unspecified: Secondary | ICD-10-CM | POA: Diagnosis present

## 2019-10-09 DIAGNOSIS — I447 Left bundle-branch block, unspecified: Secondary | ICD-10-CM | POA: Diagnosis present

## 2019-10-09 DIAGNOSIS — Z87892 Personal history of anaphylaxis: Secondary | ICD-10-CM | POA: Diagnosis not present

## 2019-10-09 DIAGNOSIS — Z6836 Body mass index (BMI) 36.0-36.9, adult: Secondary | ICD-10-CM | POA: Diagnosis not present

## 2019-10-09 DIAGNOSIS — Z955 Presence of coronary angioplasty implant and graft: Secondary | ICD-10-CM | POA: Diagnosis not present

## 2019-10-09 LAB — BASIC METABOLIC PANEL
Anion gap: 8 (ref 5–15)
BUN: 17 mg/dL (ref 8–23)
CO2: 24 mmol/L (ref 22–32)
Calcium: 9.3 mg/dL (ref 8.9–10.3)
Chloride: 108 mmol/L (ref 98–111)
Creatinine, Ser: 1.37 mg/dL — ABNORMAL HIGH (ref 0.61–1.24)
GFR calc Af Amer: 56 mL/min — ABNORMAL LOW (ref >60)
GFR calc non Af Amer: 48 mL/min — ABNORMAL LOW (ref >60)
Glucose, Bld: 124 mg/dL — ABNORMAL HIGH (ref 70–99)
Potassium: 3.8 mmol/L (ref 3.5–5.1)
Sodium: 140 mmol/L (ref 135–145)

## 2019-10-09 LAB — CBC WITH DIFFERENTIAL/PLATELET
Abs Immature Granulocytes: 0 10*3/uL (ref 0.00–0.07)
Basophils Absolute: 0 10*3/uL (ref 0.0–0.1)
Basophils Relative: 1 %
Eosinophils Absolute: 0.4 10*3/uL (ref 0.0–0.5)
Eosinophils Relative: 6 %
HCT: 39.1 % (ref 39.0–52.0)
Hemoglobin: 12.4 g/dL — ABNORMAL LOW (ref 13.0–17.0)
Immature Granulocytes: 0 %
Lymphocytes Relative: 44 %
Lymphs Abs: 2.5 10*3/uL (ref 0.7–4.0)
MCH: 26.8 pg (ref 26.0–34.0)
MCHC: 31.7 g/dL (ref 30.0–36.0)
MCV: 84.4 fL (ref 80.0–100.0)
Monocytes Absolute: 0.8 10*3/uL (ref 0.1–1.0)
Monocytes Relative: 14 %
Neutro Abs: 2 10*3/uL (ref 1.7–7.7)
Neutrophils Relative %: 35 %
Platelets: 217 10*3/uL (ref 150–400)
RBC: 4.63 MIL/uL (ref 4.22–5.81)
RDW: 17.3 % — ABNORMAL HIGH (ref 11.5–15.5)
WBC: 5.7 10*3/uL (ref 4.0–10.5)
nRBC: 0 % (ref 0.0–0.2)

## 2019-10-09 LAB — HEPATIC FUNCTION PANEL
ALT: 22 U/L (ref 0–44)
AST: 23 U/L (ref 15–41)
Albumin: 4 g/dL (ref 3.5–5.0)
Alkaline Phosphatase: 39 U/L (ref 38–126)
Bilirubin, Direct: <0.1 mg/dL (ref 0.0–0.2)
Total Bilirubin: 0.5 mg/dL (ref 0.3–1.2)
Total Protein: 7.6 g/dL (ref 6.5–8.1)

## 2019-10-09 LAB — HEPARIN LEVEL (UNFRACTIONATED)
Heparin Unfractionated: 1.07 IU/mL — ABNORMAL HIGH (ref 0.30–0.70)
Heparin Unfractionated: 0.69 IU/mL (ref 0.30–0.70)

## 2019-10-09 LAB — PROTIME-INR
INR: 1 (ref 0.8–1.2)
INR: 1.1 (ref 0.8–1.2)
Prothrombin Time: 12.8 seconds (ref 11.4–15.2)
Prothrombin Time: 14.3 seconds (ref 11.4–15.2)

## 2019-10-09 LAB — LIPASE, BLOOD: Lipase: 33 U/L (ref 11–51)

## 2019-10-09 LAB — TROPONIN I (HIGH SENSITIVITY)
Troponin I (High Sensitivity): 13 ng/L (ref <18)
Troponin I (High Sensitivity): 132 ng/L (ref <18)
Troponin I (High Sensitivity): 499 ng/L (ref <18)
Troponin I (High Sensitivity): 705 ng/L (ref <18)

## 2019-10-09 LAB — APTT: aPTT: 33 seconds (ref 24–36)

## 2019-10-09 LAB — SARS CORONAVIRUS 2 (TAT 6-24 HRS): SARS Coronavirus 2: NEGATIVE

## 2019-10-09 MED ORDER — HEPARIN BOLUS VIA INFUSION
4000.0000 [IU] | Freq: Once | INTRAVENOUS | Status: AC
  Administered 2019-10-09: 06:00:00 4000 [IU] via INTRAVENOUS

## 2019-10-09 MED ORDER — ATORVASTATIN CALCIUM 80 MG PO TABS
80.0000 mg | ORAL_TABLET | Freq: Every day | ORAL | Status: DC
  Administered 2019-10-09 – 2019-10-18 (×10): 80 mg via ORAL
  Filled 2019-10-09 (×10): qty 1

## 2019-10-09 MED ORDER — CLOPIDOGREL BISULFATE 75 MG PO TABS
75.0000 mg | ORAL_TABLET | Freq: Every day | ORAL | Status: DC
  Administered 2019-10-09 – 2019-10-10 (×2): 75 mg via ORAL
  Filled 2019-10-09 (×2): qty 1

## 2019-10-09 MED ORDER — NITROGLYCERIN 0.4 MG SL SUBL
0.4000 mg | SUBLINGUAL_TABLET | SUBLINGUAL | Status: DC | PRN
  Filled 2019-10-09: qty 1

## 2019-10-09 MED ORDER — NITROGLYCERIN 0.4 MG SL SUBL: 0.4000 mg | SUBLINGUAL_TABLET | SUBLINGUAL | Status: DC | PRN

## 2019-10-09 MED ORDER — ALBUTEROL SULFATE HFA 108 (90 BASE) MCG/ACT IN AERS
2.0000 | INHALATION_SPRAY | Freq: Once | RESPIRATORY_TRACT | Status: AC
  Administered 2019-10-09: 06:00:00 2 via RESPIRATORY_TRACT
  Filled 2019-10-09: qty 6.7

## 2019-10-09 MED ORDER — AMLODIPINE BESYLATE 10 MG PO TABS
10.0000 mg | ORAL_TABLET | Freq: Every day | ORAL | Status: DC
  Administered 2019-10-10 – 2019-10-13 (×4): 10 mg via ORAL
  Filled 2019-10-09 (×4): qty 1

## 2019-10-09 MED ORDER — SODIUM CHLORIDE 0.9 % WEIGHT BASED INFUSION
1.0000 mL/kg/h | INTRAVENOUS | Status: DC
  Administered 2019-10-10 (×2): 1 mL/kg/h via INTRAVENOUS

## 2019-10-09 MED ORDER — AMLODIPINE BESYLATE 5 MG PO TABS
5.0000 mg | ORAL_TABLET | Freq: Every day | ORAL | Status: DC
  Administered 2019-10-09: 11:00:00 5 mg via ORAL
  Filled 2019-10-09: qty 1

## 2019-10-09 MED ORDER — ACETAMINOPHEN 325 MG PO TABS: 650.0000 mg | ORAL_TABLET | ORAL | Status: DC | PRN

## 2019-10-09 MED ORDER — SODIUM CHLORIDE 0.9 % IV SOLN: 250.0000 mL | INTRAVENOUS | Status: DC | PRN

## 2019-10-09 MED ORDER — SODIUM CHLORIDE 0.9% FLUSH
3.0000 mL | Freq: Two times a day (BID) | INTRAVENOUS | Status: DC
  Administered 2019-10-11 – 2019-10-13 (×4): 3 mL via INTRAVENOUS

## 2019-10-09 MED ORDER — SODIUM CHLORIDE 0.9% FLUSH: 3.0000 mL | INTRAVENOUS | Status: DC | PRN

## 2019-10-09 MED ORDER — AMLODIPINE BESYLATE 5 MG PO TABS
5.0000 mg | ORAL_TABLET | Freq: Once | ORAL | Status: AC
  Administered 2019-10-09: 17:00:00 5 mg via ORAL
  Filled 2019-10-09: qty 1

## 2019-10-09 MED ORDER — HEPARIN (PORCINE) 25000 UT/250ML-% IV SOLN
950.0000 [IU]/h | INTRAVENOUS | Status: DC
  Administered 2019-10-09: 21:00:00 1100 [IU]/h via INTRAVENOUS
  Filled 2019-10-09: qty 250

## 2019-10-09 MED ORDER — ONDANSETRON HCL 4 MG/2ML IJ SOLN
4.0000 mg | Freq: Four times a day (QID) | INTRAMUSCULAR | Status: DC | PRN
  Administered 2019-10-14: 12:00:00 4 mg via INTRAVENOUS

## 2019-10-09 MED ORDER — SODIUM CHLORIDE 0.9% FLUSH
3.0000 mL | Freq: Two times a day (BID) | INTRAVENOUS | Status: DC
  Administered 2019-10-09 – 2019-10-13 (×3): 3 mL via INTRAVENOUS

## 2019-10-09 MED ORDER — ISOSORBIDE MONONITRATE ER 30 MG PO TB24
30.0000 mg | ORAL_TABLET | Freq: Every day | ORAL | Status: DC
  Administered 2019-10-09 – 2019-10-13 (×5): 30 mg via ORAL
  Filled 2019-10-09 (×6): qty 1

## 2019-10-09 MED ORDER — HEPARIN (PORCINE) 25000 UT/250ML-% IV SOLN
1300.0000 [IU]/h | INTRAVENOUS | Status: DC
  Administered 2019-10-09: 06:00:00 1300 [IU]/h via INTRAVENOUS
  Filled 2019-10-09: qty 250

## 2019-10-09 MED ORDER — CARVEDILOL 12.5 MG PO TABS
12.5000 mg | ORAL_TABLET | Freq: Two times a day (BID) | ORAL | Status: DC
  Administered 2019-10-09 – 2019-10-11 (×5): 12.5 mg via ORAL
  Filled 2019-10-09 (×5): qty 1

## 2019-10-09 MED ORDER — SODIUM CHLORIDE 0.9 % WEIGHT BASED INFUSION
3.0000 mL/kg/h | INTRAVENOUS | Status: DC
  Administered 2019-10-10: 3 mL/kg/h via INTRAVENOUS

## 2019-10-09 NOTE — Progress Notes (Signed)
5 Bts of VT on teli, pt asymptomatic, PA [employer].

## 2019-10-09 NOTE — Progress Notes (Signed)
ANTICOAGULATION CONSULT NOTE - Initial Consult  Pharmacy Consult for Heparin Indication: chest pain/ACS  Allergies  Allergen Reactions  . Aspirin Anaphylaxis  . Other     Foods that are high in acid content cause sores and a rash.   . Penicillins Nausea And Vomiting    Patient Measurements: Height: 5\' 8"  (172.7 cm) Weight: 109.3 kg (241 lb) IBW/kg (Calculated) : 68.4 Heparin Dosing Weight: 90 kg  Vital Signs: Temp: 98.5 F (36.9 C) (04/11 0236) Temp Source: Oral (04/11 0236) BP: 154/80 (04/11 0500) Pulse Rate: 62 (04/11 0500)  Labs: Recent Labs    10/09/19 0252 10/09/19 0432  HGB 12.4*  --   HCT 39.1  --   PLT 217  --   CREATININE 1.37*  --   TROPONINIHS 13 132*    Estimated Creatinine Clearance: 50.7 mL/min (A) (by C-G formula based on SCr of 1.37 mg/dL (H)).   Medical History: Past Medical History:  Diagnosis Date  . Anaphylaxis    Aspirin  . Coronary atherosclerosis of native coronary artery    BMS to RCA and circumflex 10/09, DES to D1 and RCA 04/2014  . Essential hypertension   . Hyperlipidemia   . Myocardial infarction ([place]) 1998  . NSTEMI (non-ST elevated myocardial infarction) ([place]) 03/2008  . Seasonal allergies     Medications:  No current facility-administered medications on file prior to encounter.   Current Outpatient Medications on File Prior to Encounter  Medication Sig Dispense Refill  . amLODipine (NORVASC) 5 MG tablet Take 1 tablet (5 mg total) by mouth daily. 90 tablet 3  . Ascorbic Acid (VITAMIN C) 1000 MG tablet Take 1,000 mg by mouth daily.      [name] atorvastatin (LIPITOR) 80 MG tablet Take 1 tablet daily at 6 pm. 90 tablet 3  . carvedilol (COREG) 12.5 MG tablet TAKE 1 TABLET BY MOUTH TWICE DAILY --  NEEDS  FOLLOW  UP  APPOINTMENT  FOR  FURTHER  REFILLS 180 tablet 3  . clopidogrel (PLAVIX) 75 MG tablet TAKE 1 TABLET BY MOUTH ONCE DAILY - NEED FOLLOW UP FOR REFILLS 90 tablet 3  . isosorbide mononitrate (IMDUR) 30 MG 24 hr tablet Take 1  tablet by mouth once daily 90 tablet 2  . lisinopril (ZESTRIL) 20 MG tablet TAKE 1 TABLET BY MOUTH TWICE DAILY . APPOINTMENT REQUIRED FOR FUTURE REFILLS 180 tablet 3  . nitroGLYCERIN (NITROSTAT) 0.4 MG SL tablet Place 1 tablet (0.4 mg total) under the tongue every 5 (five) minutes as needed. 25 tablet 0  . Omega-3 Fatty Acids (FISH OIL) 1000 MG CAPS Take 1,000 mg by mouth 2 (two) times daily.    . vitamin E 1000 UNIT capsule Take 1,000 Units by mouth daily.         Assessment: 84 y.o. male with chest pain for heparin  Goal of Therapy:  Heparin level 0.3-0.7 units/ml Monitor platelets by anticoagulation protocol: Yes   Plan:  Heparin 4000 units IV bolus, then start heparin 1300 units/hr Check heparin level in 6 hours.   Corey [name] 10/09/2019,5:58 AM

## 2019-10-09 NOTE — Progress Notes (Signed)
[place] for Heparin Indication: chest pain/ACS  Allergies  Allergen Reactions  . Aspirin Anaphylaxis  . Other     Foods that are high in acid content cause sores and a rash.   . Penicillins Nausea And Vomiting    Patient Measurements: Height: 5\' 8"  (172.7 cm) Weight: 107.1 kg (236 lb 1 oz) IBW/kg (Calculated) : 68.4 Heparin Dosing Weight: 90 kg  Vital Signs: Temp: 98.3 F (36.8 C) (04/11 2130) Temp Source: Oral (04/11 2130) BP: 167/76 (04/11 2136) Pulse Rate: 71 (04/11 2136)  Labs: Recent Labs    10/09/19 0242 10/09/19 0252 10/09/19 0252 10/09/19 0432 10/09/19 0821 10/09/19 1200 10/09/19 1517 10/09/19 2224  HGB  --  12.4*  --   --   --   --   --   --   HCT  --  39.1  --   --   --   --   --   --   PLT  --  217  --   --   --   --   --   --   APTT 33  --   --   --   --   --   --   --   LABPROT 12.8  --   --   --   --   --  14.3  --   INR 1.0  --   --   --   --   --  1.1  --   HEPARINUNFRC  --   --   --   --   --   --  1.07* 0.69  CREATININE  --  1.37*  --   --   --   --   --   --   TROPONINIHS  --  13   < > 132* 499* 705*  --   --    < > = values in this interval not displayed.    Estimated Creatinine Clearance: 50.2 mL/min (A) (by C-G formula based on SCr of 1.37 mg/dL (H)).   Medical History: Past Medical History:  Diagnosis Date  . Anaphylaxis    Aspirin  . Coronary atherosclerosis of native coronary artery    a. BMS to RCA and LCx 10/09, DES to D1 and RCA 04/2014  . Essential hypertension   . Hyperlipidemia   . Myocardial infarction ([place]) 1998  . NSTEMI (non-ST elevated myocardial infarction) ([place]) 03/2008  . Seasonal allergies     Medications:  No current facility-administered medications on file prior to encounter.   Current Outpatient Medications on File Prior to Encounter  Medication Sig Dispense Refill  . amLODipine (NORVASC) 5 MG tablet Take 1 tablet (5 mg total) by mouth daily. 90 tablet 3  .  Ascorbic Acid (VITAMIN C) 1000 MG tablet Take 1,000 mg by mouth daily.      [name] atorvastatin (LIPITOR) 80 MG tablet Take 1 tablet daily at 6 pm. 90 tablet 3  . carvedilol (COREG) 12.5 MG tablet TAKE 1 TABLET BY MOUTH TWICE DAILY --  NEEDS  FOLLOW  UP  APPOINTMENT  FOR  FURTHER  REFILLS 180 tablet 3  . clopidogrel (PLAVIX) 75 MG tablet TAKE 1 TABLET BY MOUTH ONCE DAILY - NEED FOLLOW UP FOR REFILLS 90 tablet 3  . isosorbide mononitrate (IMDUR) 30 MG 24 hr tablet Take 1 tablet by mouth once daily 90 tablet 2  . lisinopril (ZESTRIL) 20 MG tablet TAKE 1 TABLET BY MOUTH TWICE DAILY . APPOINTMENT  REQUIRED FOR FUTURE REFILLS 180 tablet 3  . nitroGLYCERIN (NITROSTAT) 0.4 MG SL tablet Place 1 tablet (0.4 mg total) under the tongue every 5 (five) minutes as needed. 25 tablet 0  . Omega-3 Fatty Acids (FISH OIL) 1000 MG CAPS Take 1,000 mg by mouth 2 (two) times daily.    . vitamin E 1000 UNIT capsule Take 1,000 Units by mouth daily.         Assessment: 84 y.o. male with chest pain for heparin. No AC PTA.  Initial heparin level came back supratherapeutic at 1.07, on 1300 units/hr. Heparin is running in peripheral, level was drawn from opposite site. Hgb 12.4, plt 217. No s/sx of bleeding or infusion issues.   4/11 PM update:  Heparin level therapeutic after rate decrease   Goal of Therapy:  Heparin level 0.3-0.7 units/ml Monitor platelets by anticoagulation protocol: Yes   Plan:  Cont heparin at 1100 units/hr Confirmatory heparin level with AM labs  Narda Bonds, PharmD, [place] Pharmacist Phone: [phone]

## 2019-10-09 NOTE — Progress Notes (Signed)
Pt had 4/10 CP. Felt SOB. EKG done. Refused nitro. Cardio oncall MD paged.  VS as follows:  BP (!) 167/76   Pulse 71   Temp 98.3 F (36.8 C) (Oral)   Resp 20   Ht 5\' 8"  (1.727 m)   Wt 107.1 kg   SpO2 98%   BMI 35.89 kg/m    0/10 CP at this time. Pt states he feels better.

## 2019-10-09 NOTE — H&P (Addendum)
Cardiology Admission History and Physical:   Patient ID: XAIDYN WENNER MRN: [id number]; DOB: 05/16/1936   Admission date: 10/09/2019  Primary Care Provider: [facility] Primary Cardiologist: Rozann Lesches, MD   Chief Complaint: NSTEMI- chest pain  Patient Profile:   Corey [name] is a 84 y.o. 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   History of Present Illness:   The patient has a history of CAD s/p several previous interventions. Per review of records, he had a NSTEMI with a peak troponin 24 in 2009 and underwent  BMS to RCA and staged PCI to LCx in 2009. He had progressive angina with an abnormal stress test in 2015. Cardiac catheterization 05/18/2014 showed three-vessel native coronary artery disease with severe stenosis of D1 and mid RCA treated with drug-eluting stents.   He had a myoview in 02/2018 to evaluate ischemic burden. This showed evidence of moderate sized anterior infarct scar but no active ischemia; EF 30-44%. Follow-up echocardiogram in 02/2018 revealed LVEF in the range of 45 to 50% with mild diastolic dysfunction. He was continued on medical therapy.  Corey [name] was last examined by Dr. Domenic Polite in 03/2019 and denied any recent chest pain or dyspnea on exertion at that time. He was continued on his current medication regimen including Amlodipine 5mg  daily, Atorvastatin 80mg  daily, Coreg 12.5mg  BID, Plavix 75mg  daily, Imdur 30mg  daily and Lisinopril 20mg  BID.   He presented to [facility] ED during the early morning hours of 10/09/2019 for evaluation of chest pain which started the previous evening. Initial labs show WBC 5.7,  Hgb 12.4, platelets 217, Na+ 140, K+ 3.8 and creatinine 1.37 (no recent labs available for comparison). Initial HS Troponin negative at 13. It then returned abnormal at 132--> 499--> 705.  COVID pending. CXR with no active cardiopulmonary disease. EKG shows NSR, HR 83 with known LBBB. TWI along lateral leads similar to prior tracings. He was transffered to [facility] for further work up and treatment.  The patient had his first covid 19 vaccination ~2 weeks ago.  Ever since then he has had nightly abdominal discomfort that "felt like he was hungry".  Then last night he had 10/10 chest pain that felt like an elephant was sitting on his chest.  This was not associated with shortness of breath or diaphoresis.  The pain radiated to his back.  He reports this chest pain was very reminiscent of his previous chest pain when he had his other 3 heart attacks. The whole episode lasted about 1 hour. He did not try SL NTG. He called his daughter who brought him to [facility] ED.  Before the COVID-19 pandemic the patient was regularly working out at [employer].  Because of social distancing guidelines he has not been able to work out.  He has been doing some walks around his neighborhood.  He has noticed that he has had progressive dyspnea on exertion while walking.  He he now cannot walk even short distances without significant dyspnea.  He denies lower extremity edema, orthopnea or PND.  No dizziness or syncope.  No blood in his stool or urine.   Past Medical History:  Diagnosis Date  . Anaphylaxis    Aspirin  . Coronary atherosclerosis of native coronary artery    a. BMS to RCA and LCx 10/09, DES to D1 and RCA 04/2014  . Essential hypertension   . Hyperlipidemia   . Myocardial infarction ([place]) 1998  . NSTEMI (non-ST elevated myocardial infarction) ([place]) 03/2008  . Seasonal allergies     Past Surgical History:  Procedure Laterality Date  . CARDIAC CATHETERIZATION     Cath 05/18/2014 DES to D1 and mid RCA  . COLONOSCOPY   05/01/2011   Procedure: COLONOSCOPY;  Surgeon: Daneil Dolin, MD;  Location: [facility] SUITE;  Service: Endoscopy;  Laterality: N/A;  11:30  . CORONARY ANGIOPLASTY WITH STENT PLACEMENT  07/1996; 03/2008; 05/18/2014   "?3; ?2; 2"  . LEFT HEART CATHETERIZATION WITH CORONARY ANGIOGRAM N/A 05/18/2014   Procedure: LEFT HEART CATHETERIZATION WITH CORONARY ANGIOGRAM;  Surgeon: Blane Ohara, MD;  Location: [facility] CATH LAB;  Service: Cardiovascular;  Laterality: N/A;     Medications Prior to Admission: Prior to Admission medications   Medication Sig Start Date End Date Taking? Authorizing Provider  amLODipine (NORVASC) 5 MG tablet Take 1 tablet (5 mg total) by mouth daily. 04/21/19   Satira Sark, MD  Ascorbic Acid (VITAMIN C) 1000 MG tablet Take 1,000 mg by mouth daily.      [provider]  atorvastatin (LIPITOR) 80 MG tablet Take 1 tablet daily at 6 pm. 04/21/19   Satira Sark, MD  carvedilol (COREG) 12.5 MG tablet TAKE 1 TABLET BY MOUTH TWICE DAILY --  NEEDS  FOLLOW  UP  APPOINTMENT  FOR  FURTHER  REFILLS 04/21/19   Satira Sark, MD  clopidogrel (PLAVIX) 75 MG tablet TAKE 1 TABLET BY MOUTH ONCE DAILY - NEED FOLLOW UP FOR REFILLS 04/21/19   Satira Sark, MD  isosorbide mononitrate (IMDUR) 30 MG 24 hr tablet Take 1 tablet by mouth once daily 06/28/19   Satira Sark, MD  lisinopril (ZESTRIL) 20 MG tablet TAKE 1 TABLET BY MOUTH TWICE DAILY . APPOINTMENT REQUIRED FOR FUTURE REFILLS 04/21/19   Satira Sark, MD  nitroGLYCERIN (NITROSTAT) 0.4 MG SL tablet Place 1 tablet (0.4 mg total) under the tongue every 5 (five) minutes as needed. 05/19/14   Almyra Deforest, PA  Omega-3 Fatty Acids (FISH OIL) 1000 MG CAPS Take 1,000 mg by mouth 2 (two) times daily.    [provider]  vitamin E 1000 UNIT capsule Take 1,000 Units by mouth daily.      [provider]     Allergies:    Allergies  Allergen Reactions  . Aspirin Anaphylaxis  . Other     Foods that  are high in acid content cause sores and a rash.   . Penicillins Nausea And Vomiting    Social History:   Social History   Socioeconomic History  . Marital status: Widowed    Spouse name: Not on file  . Number of children: Not on file  . Years of education: Not on file  . Highest education level: Not on file  Occupational History  . Occupation: Retired    Comment: Quarry manager  Tobacco Use  . Smoking status: Former Smoker    Packs/day: 2.00    Years: 45.00    Pack years: 90.00    Types: Cigarettes    Quit date: 07/31/1996    Years since quitting: 23.2  . Smokeless tobacco: Never Used  Substance and Sexual Activity  .  Alcohol use: No  . Drug use: No  . Sexual activity: Not Currently  Other Topics Concern  . Not on file  Social History Narrative  . Not on file   Social Determinants of Health   Financial Resource Strain:   . Difficulty of Paying Living Expenses:   Food Insecurity:   . Worried About Charity fundraiser in the Last Year:   . Arboriculturist in the Last Year:   Transportation Needs:   . Film/video editor (Medical):   [name] Lack of Transportation (Non-Medical):   Physical Activity:   . Days of Exercise per Week:   . Minutes of Exercise per Session:   Stress:   . Feeling of Stress :   Social Connections:   . Frequency of Communication with Friends and Family:   . Frequency of Social Gatherings with Friends and Family:   . Attends Religious Services:   . Active Member of Clubs or Organizations:   . Attends Archivist Meetings:   [name] Marital Status:   Intimate Partner Violence:   . Fear of Current or Ex-Partner:   . Emotionally Abused:   [name] Physically Abused:   . Sexually Abused:     Family History:   The patient's family history includes Hypertension in an other family member. There is no history of Colon cancer.    ROS:  Please see the history of present illness.  All other ROS reviewed and negative.     Physical Exam/Data:    Vitals:   10/09/19 1215 10/09/19 1230 10/09/19 1300 10/09/19 1418  BP:  (!) 142/77 (!) 156/77 (!) 185/94  Pulse: 63 (!) 58 69 69  Resp: 17 11 17 20   Temp:    97.9 F (36.6 C)  TempSrc:    Oral  SpO2: 96% 96% 96% 100%  Weight:      Height:       No intake or output data in the 24 hours ending 10/09/19 1422 Last 3 Weights 10/09/2019 04/18/2019 01/20/2018  Weight (lbs) 241 lb 265 lb 240 lb  Weight (kg) 109.317 kg 120.203 kg 108.863 kg     Body mass index is 36.64 kg/m.  General:  Well nourished, well developed, in no acute distress HEENT: normal Lymph: no adenopathy Neck: no JVD. Endocrine:  No thryomegaly Cardiac:  normal S1, S2; RRR; no murmur  Lungs:  clear to auscultation bilaterally, no wheezing, rhonchi or rales  Abd: soft, nontender, no hepatomegaly  Ext: no edema Musculoskeletal:  No deformities, BUE and BLE strength normal and equal Skin: warm and dry  Neuro:  CNs 2-12 intact, no focal abnormalities noted Psych:  Normal affect    EKG:  The ECG that was done was personally reviewed and demonstrates NSR, HR 83 with known LBBB.  TWI along lateral leads similar to prior tracings.   Relevant CV Studies:  NST: 02/2018  There was no ST segment deviation noted during stress.  Findings consistent with prior septal/inferoseptal/inferior/inferoapical myocardial infarction. Evidence of prior moderate anterior infarct. No current ischemia.  This is an intermediate risk study. Risked based on decreased LVEF and scar. No current myocardium at jeopardy.  The left ventricular ejection fraction is moderately decreased (30-44%).  _______________  Echocardiogram: 02/2018 Study Conclusions  - Left ventricle: The cavity size was normal. Wall thickness was  increased in a pattern of severe LVH. Systolic function was  mildly reduced. The estimated ejection fraction was in the range  of 45% to 50%. There is  akinesis of the basalinferoseptal  myocardium. There is  hypokinesis of the mid-apicalanteroseptal  myocardium. Doppler parameters are consistent with abnormal left  ventricular relaxation (grade 1 diastolic dysfunction).  - Aortic valve: Mildly calcified annulus. Trileaflet.  - Mitral valve: Mildly calcified annulus. There was mild  regurgitation.  - Right atrium: Central venous pressure (est): 3 mm Hg.  - Atrial septum: No defect or patent foramen ovale was identified.  - Tricuspid valve: There was trivial regurgitation.  - Pulmonary arteries: Systolic pressure could not be accurately  estimated.  - Pericardium, extracardiac: There was no pericardial effusion.   Laboratory Data:  High Sensitivity Troponin:   Recent Labs  Lab 10/09/19 0252 10/09/19 0432 10/09/19 0821 10/09/19 1200  TROPONINIHS 13 132* 499* 705*      Chemistry Recent Labs  Lab 10/09/19 0252  NA 140  K 3.8  CL 108  CO2 24  GLUCOSE 124*  BUN 17  CREATININE 1.37*  CALCIUM 9.3  GFRNONAA 48*  GFRAA 56*  ANIONGAP 8    Recent Labs  Lab 10/09/19 0252  PROT 7.6  ALBUMIN 4.0  AST 23  ALT 22  ALKPHOS 39  BILITOT 0.5   Hematology Recent Labs  Lab 10/09/19 0252  WBC 5.7  RBC 4.63  HGB 12.4*  HCT 39.1  MCV 84.4  MCH 26.8  MCHC 31.7  RDW 17.3*  PLT 217   BNPNo results for input(s): BNP, PROBNP in the last 168 hours.  DDimer No results for input(s): DDIMER in the last 168 hours.   Radiology/Studies:  DG Chest Port 1 View  Result Date: 10/09/2019 CLINICAL DATA:  Chest pain. EXAM: PORTABLE CHEST 1 VIEW COMPARISON:  May 16, 2014 FINDINGS: The heart size and mediastinal contours are within normal limits. Both lungs are clear. Degenerative changes seen throughout the thoracic spine. IMPRESSION: No active disease. Electronically Signed   By: Virgina Norfolk M.D.   On: 10/09/2019 03:23       TIMI Risk Score for Unstable Angina or Non-ST Elevation MI:   The patient's TIMI risk score is 5, which indicates a 26% risk of all cause mortality,  new or recurrent myocardial infarction or need for urgent revascularization in the next 14 days.   Assessment and Plan:   1. NSTEMI: HS troponin 132--> 499--> 705. ECG with chronic LBBB and no acute ST/TW changes. Patient presents with his typical cardiac chest pain and troponin rising. He is currently on IV heparin. 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 Plan for [facility] tomorrow. Will update echo as well. Continue high dose statin and BB.  I have reviewed the risks, indications, and alternatives to cardiac catheterization and possible angioplasty/stenting with the patient. Risks include but are not limited to bleeding, infection, vascular injury, stroke, myocardial infection, arrhythmia, kidney injury, radiation-related injury in the case of prolonged fluoroscopy use, emergency cardiac surgery, and death. The patient understands the risks of serious complication is low (123456).   HTN: BP is very elevated currently. Will Norvasc from 5mg  to 10mg  daily.   NSVT: continue BB   CKD stage II: creat 1.37. No recent labs to compare to. Continue to monitor   Severity of Illness: The appropriate patient status for this patient is INPATIENT. Inpatient status is judged to be reasonable and necessary in order to provide the required intensity of service to ensure the patient's safety. The patient's presenting symptoms, physical exam findings, and initial radiographic and laboratory data in the context of their chronic  comorbidities is felt to place them at high risk for further clinical deterioration. Furthermore, it is not anticipated that the patient will be medically stable for discharge from the hospital within 2 midnights of admission. The following factors support the patient status of inpatient.   " The patient's presenting symptoms include chest pain. " The worrisome physical exam findings include: n/a. " The initial radiographic and laboratory data are  worrisome because of rising troponin. " The chronic co-morbidities include CAD, HTN, HLD, LV dysfunction.  * I certify that at the point of admission it is my clinical judgment that the patient will require inpatient hospital care spanning beyond 2 midnights from the point of admission due to high intensity of service, high risk for further deterioration and high frequency of surveillance required.*    For questions or updates, please contact [place] Please consult [email or web address] for contact info under        Signed, Angelena Form, PA-C  10/09/2019 2:22 PM

## 2019-10-09 NOTE — ED Notes (Signed)
Family at bedside. 

## 2019-10-09 NOTE — ED Notes (Signed)
Report given to [facility]. 

## 2019-10-09 NOTE — ED Notes (Signed)
Notified [place] with [facility] about patient bed ready status at [facility]

## 2019-10-09 NOTE — ED Provider Notes (Signed)
[facility] EMERGENCY DEPARTMENT Provider Note   CSN: [id number] Arrival date & time: 10/09/19  [id number]     History Chief Complaint  Patient presents with  . Chest Pain    Corey [name] is a 84 y.o. male.  The history is provided by the patient.  Abdominal Pain Pain location:  Generalized Pain quality: sharp   Pain radiates to:  Back Pain severity:  Moderate Onset quality:  Sudden Timing:  Constant Progression:  Resolved Chronicity:  New Relieved by:  None tried Worsened by:  Nothing Associated symptoms: chest pain and shortness of breath   Associated symptoms: no dysuria, no fever and no vomiting    Patient with history of CAD, hypertension, obesity, hyperlipidemia presents with abdominal pain.  He reports that he began having episodes of generalized abdominal pain that was sharp and at times in his back.  This occurred after eating a sandwich He then began having pain into his chest and back that was sharp.  He reports mild shortness of breath.  No diaphoresis, no vomiting.  He did not take any medications.  He reports this was not like previous episodes of angina He reports he is actually not felt well since his COVID-19 vaccination last month However tonight's episode was the worst.   No other acute complaints.  No focal weakness reported  Past Medical History:  Diagnosis Date  . Anaphylaxis    Aspirin  . Coronary atherosclerosis of native coronary artery    BMS to RCA and circumflex 10/09, DES to D1 and RCA 04/2014  . Essential hypertension   . Hyperlipidemia   . Myocardial infarction ([place]) 1998  . NSTEMI (non-ST elevated myocardial infarction) ([place]) 03/2008  . Seasonal allergies     Patient Active Problem List   Diagnosis Date Noted  . Exertional angina ([place]) 05/18/2014  . Abnormal myocardial perfusion study 05/16/2014  . Accelerating angina ([place]) 05/16/2014  . Screening for colon cancer 04/10/2011  . Mixed hyperlipidemia 08/09/2009  . Essential  hypertension 08/09/2009  . CORONARY ATHEROSCLEROSIS NATIVE CORONARY ARTERY 08/09/2009    Past Surgical History:  Procedure Laterality Date  . CARDIAC CATHETERIZATION     Cath 05/18/2014 DES to D1 and mid RCA  . COLONOSCOPY  05/01/2011   Procedure: COLONOSCOPY;  Surgeon: Daneil Dolin, MD;  Location: [facility] ENDO SUITE;  Service: Endoscopy;  Laterality: N/A;  11:30  . CORONARY ANGIOPLASTY WITH STENT PLACEMENT  07/1996; 03/2008; 05/18/2014   "?3; ?2; 2"  . LEFT HEART CATHETERIZATION WITH CORONARY ANGIOGRAM N/A 05/18/2014   Procedure: LEFT HEART CATHETERIZATION WITH CORONARY ANGIOGRAM;  Surgeon: Blane Ohara, MD;  Location: [facility] CATH LAB;  Service: Cardiovascular;  Laterality: N/A;       Family History  Problem Relation Age of Onset  . Hypertension Other   . Colon cancer Neg Hx     Social History   Tobacco Use  . Smoking status: Former Smoker    Packs/day: 2.00    Years: 45.00    Pack years: 90.00    Types: Cigarettes    Quit date: 07/31/1996    Years since quitting: 23.2  . Smokeless tobacco: Never Used  Substance Use Topics  . Alcohol use: No  . Drug use: No    Home Medications Prior to Admission medications   Medication Sig Start Date End Date Taking? Authorizing Provider  amLODipine (NORVASC) 5 MG tablet Take 1 tablet (5 mg total) by mouth daily. 04/21/19   Satira Sark, MD  Ascorbic Acid (VITAMIN  C) 1000 MG tablet Take 1,000 mg by mouth daily.      [provider]  atorvastatin (LIPITOR) 80 MG tablet Take 1 tablet daily at 6 pm. 04/21/19   Satira Sark, MD  carvedilol (COREG) 12.5 MG tablet TAKE 1 TABLET BY MOUTH TWICE DAILY --  NEEDS  FOLLOW  UP  APPOINTMENT  FOR  FURTHER  REFILLS 04/21/19   Satira Sark, MD  clopidogrel (PLAVIX) 75 MG tablet TAKE 1 TABLET BY MOUTH ONCE DAILY - NEED FOLLOW UP FOR REFILLS 04/21/19   Satira Sark, MD  isosorbide mononitrate (IMDUR) 30 MG 24 hr tablet Take 1 tablet by mouth once daily 06/28/19   Satira Sark, MD  lisinopril (ZESTRIL) 20 MG tablet TAKE 1 TABLET BY MOUTH TWICE DAILY . APPOINTMENT REQUIRED FOR FUTURE REFILLS 04/21/19   Satira Sark, MD  nitroGLYCERIN (NITROSTAT) 0.4 MG SL tablet Place 1 tablet (0.4 mg total) under the tongue every 5 (five) minutes as needed. 05/19/14   Almyra Deforest, PA  Omega-3 Fatty Acids (FISH OIL) 1000 MG CAPS Take 1,000 mg by mouth 2 (two) times daily.    [provider]  vitamin E 1000 UNIT capsule Take 1,000 Units by mouth daily.      [provider]    Allergies    Aspirin, Other, and Penicillins  Review of Systems   Review of Systems  Constitutional: Negative for fever.  Respiratory: Positive for shortness of breath.   Cardiovascular: Positive for chest pain.  Gastrointestinal: Positive for abdominal pain. Negative for blood in stool and vomiting.  Genitourinary: Negative for dysuria.  Neurological: Negative for weakness and numbness.  All other systems reviewed and are negative.   Physical Exam Updated Vital Signs BP (!) 189/105   Pulse 84   Temp 98.5 F (36.9 C) (Oral)   Resp (!) 21   Ht 1.727 m (5\' 8" )   Wt 109.3 kg   SpO2 97%   BMI 36.64 kg/m   Physical Exam CONSTITUTIONAL: Well developed/well nourished HEAD: Normocephalic/atraumatic EYES: EOMI/PERRL, no icterus ENMT: Mucous membranes moist NECK: supple no meningeal signs SPINE/BACK:entire spine nontender CV: S1/S2 noted, no murmurs/rubs/gallops noted LUNGS: Lungs are clear to auscultation bilaterally, no apparent distress ABDOMEN: soft, nontender, no rebound or guarding, bowel sounds noted throughout abdomen GU:no cva tenderness NEURO: Pt is awake/alert/appropriate, moves all extremitiesx4.  No facial droop.  No arm or leg drift EXTREMITIES: pulses normal/equal, full ROM SKIN: warm, color normal PSYCH: no abnormalities of mood noted, alert and oriented to situation  ED Results / Procedures / Treatments   Labs (all labs ordered are listed, but  only abnormal results are displayed) Labs Reviewed  BASIC METABOLIC PANEL - Abnormal; Notable for the following components:      Result Value   Glucose, Bld 124 (*)    Creatinine, Ser 1.37 (*)    GFR calc non Af Amer 48 (*)    GFR calc Af Amer 56 (*)    All other components within normal limits  CBC WITH DIFFERENTIAL/PLATELET - Abnormal; Notable for the following components:   Hemoglobin 12.4 (*)    RDW 17.3 (*)    All other components within normal limits  TROPONIN I (HIGH SENSITIVITY) - Abnormal; Notable for the following components:   Troponin I (High Sensitivity) 132 (*)    All other components within normal limits  SARS CORONAVIRUS 2 (TAT 6-24 HRS)  LIPASE, BLOOD  HEPATIC FUNCTION PANEL  APTT  PROTIME-INR  HEPARIN LEVEL (UNFRACTIONATED)  TROPONIN I (HIGH SENSITIVITY)    EKG EKG Interpretation  Date/Time:  Sunday October 09 2019 02:34:08 EDT Ventricular Rate:  83 PR Interval:    QRS Duration: 149 QT Interval:  380 QTC Calculation: 447 R Axis:   3 Text Interpretation: Sinus rhythm Left bundle branch block Baseline wander in lead(s) II III aVL aVF Interpretation limited secondary to artifact No significant change since last tracing Confirmed by Ripley Fraise [phone]) on 10/09/2019 2:42:28 AM   EKG Interpretation  Date/Time:  Sunday October 09 2019 05:50:26 EDT Ventricular Rate:  58 PR Interval:    QRS Duration: 141 QT Interval:  434 QTC Calculation: 427 R Axis:   -8 Text Interpretation: Sinus rhythm Left bundle branch block Baseline wander in lead(s) V5 Interpretation limited secondary to artifact No significant change since last tracing Confirmed by Ripley Fraise [phone]) on 10/09/2019 5:57:34 AM       Radiology DG Chest Port 1 View  Result Date: 10/09/2019 CLINICAL DATA:  Chest pain. EXAM: PORTABLE CHEST 1 VIEW COMPARISON:  May 16, 2014 FINDINGS: The heart size and mediastinal contours are within normal limits. Both lungs are clear. Degenerative changes  seen throughout the thoracic spine. IMPRESSION: No active disease. Electronically Signed   By: Virgina Norfolk M.D.   On: 10/09/2019 03:23    Procedures .Critical Care Performed by: Ripley Fraise, MD Authorized by: Ripley Fraise, MD   Critical care provider statement:    Critical care time (minutes):  30   Critical care start time:  10/09/2019 5:30 AM   Critical care end time:  10/09/2019 6:00 AM   Critical care was necessary to treat or prevent imminent or life-threatening deterioration of the following conditions:  Cardiac failure   Critical care was time spent personally by me on the following activities:  Ordering and review of radiographic studies, ordering and review of laboratory studies, pulse oximetry, re-evaluation of patient's condition, examination of patient, discussions with consultants, development of treatment plan with patient or surrogate, review of old charts and ordering and performing treatments and interventions     Medications Ordered in ED Medications  heparin bolus via infusion 4,000 Units (has no administration in time range)  heparin ADULT infusion 100 units/mL (25000 units/282mL sodium chloride 0.45%) (has no administration in time range)  albuterol (VENTOLIN HFA) 108 (90 Base) MCG/ACT inhaler 2 puff (has no administration in time range)    ED Course  I have reviewed the triage vital signs and the nursing notes.  Pertinent labs & imaging results that were available during my care of the patient were reviewed by me and considered in my medical decision making (see chart for details).    MDM Rules/Calculators/A&P                      3:39 AM Initial report was that patient had chest pain, but he tells me that his pain for started in his abdomen and into his back.  He reports he did not feel like previous episodes of angina.  He is currently pain-free at this time.  I have added on liver function panel as well as lipase due to his abdominal pain.  No  acute EKG changes 4:34 AM Patient without any new symptoms He now reports that he has these episodes every night since his last Covid vaccine.  He has discussed this with his PCP who thinks it may be an ulcer.  He confirms again that the pain starts in his lower abdomen and moves  up into his chest and back. 6:24 AM Repeat troponin is elevated indicating an early non-STEMI.  Repeat EKG was reviewed and has no acute changes, old left bundle branch block noted I have discussed the case with the on-call cardiology fellow.  He request starting heparin and transfer to [facility]. Patient is currently chest pain-free.  He is in no acute distress.  Will defer nitroglycerin.  We will start heparin.  He denies any recent bleeding episodes or GI bleed. I have low suspicion for PE/dissection at this time.  Patient is high risk for ACS due to history of CAD and previous stent placement.   This patient presents to the ED for concern of chest pain and abdominal pain, this involves an extensive number of treatment options, and is a complaint that carries with it a high risk of complications and morbidity.  The differential diagnosis includes ACS, cholelithiasis, pancreatitis, bowel perforation   Lab Tests:   I Ordered, reviewed, and interpreted labs, which included liver function test, basic metabolic panel, troponin, lipase  Medicines ordered:   I ordered medication Heparin for unstable angina/non-STEMI  Albuterol for wheezing  Imaging Studies ordered:   I ordered imaging studies which included chest x-ray and  I independently visualized and interpreted imaging which showed no acute findings  Additional history obtained:    Previous records obtained and reviewed with previous history of CAD and stent placement  Consultations Obtained:   I consulted cardiology and discussed lab and imaging findings  Reevaluation:  After the interventions stated above, I reevaluated the patient  and found stable    Final Clinical Impression(s) / ED Diagnoses Final diagnoses:  NSTEMI (non-ST elevated myocardial infarction) [facility])    Rx / [place] Orders ED Discharge Orders    None       Ripley Fraise, MD 10/09/19 [id number]

## 2019-10-09 NOTE — Progress Notes (Signed)
[place] for Heparin Indication: chest pain/ACS  Allergies  Allergen Reactions  . Aspirin Anaphylaxis  . Other     Foods that are high in acid content cause sores and a rash.   . Penicillins Nausea And Vomiting    Patient Measurements: Height: 5\' 8"  (172.7 cm) Weight: 107.1 kg (236 lb 1 oz) IBW/kg (Calculated) : 68.4 Heparin Dosing Weight: 90 kg  Vital Signs: Temp: 97.9 F (36.6 C) (04/11 1418) Temp Source: Oral (04/11 1418) BP: 185/94 (04/11 1418) Pulse Rate: 69 (04/11 1418)  Labs: Recent Labs    10/09/19 0242 10/09/19 0252 10/09/19 0252 10/09/19 0432 10/09/19 0821 10/09/19 1200 10/09/19 1517  HGB  --  12.4*  --   --   --   --   --   HCT  --  39.1  --   --   --   --   --   PLT  --  217  --   --   --   --   --   APTT 33  --   --   --   --   --   --   LABPROT 12.8  --   --   --   --   --  14.3  INR 1.0  --   --   --   --   --  1.1  HEPARINUNFRC  --   --   --   --   --   --  1.07*  CREATININE  --  1.37*  --   --   --   --   --   TROPONINIHS  --  13   < > 132* 499* 705*  --    < > = values in this interval not displayed.    Estimated Creatinine Clearance: 50.2 mL/min (A) (by C-G formula based on SCr of 1.37 mg/dL (H)).   Medical History: Past Medical History:  Diagnosis Date  . Anaphylaxis    Aspirin  . Coronary atherosclerosis of native coronary artery    a. BMS to RCA and LCx 10/09, DES to D1 and RCA 04/2014  . Essential hypertension   . Hyperlipidemia   . Myocardial infarction ([place]) 1998  . NSTEMI (non-ST elevated myocardial infarction) ([place]) 03/2008  . Seasonal allergies     Medications:  No current facility-administered medications on file prior to encounter.   Current Outpatient Medications on File Prior to Encounter  Medication Sig Dispense Refill  . amLODipine (NORVASC) 5 MG tablet Take 1 tablet (5 mg total) by mouth daily. 90 tablet 3  . Ascorbic Acid (VITAMIN C) 1000 MG tablet Take 1,000 mg by mouth  daily.      [name] atorvastatin (LIPITOR) 80 MG tablet Take 1 tablet daily at 6 pm. 90 tablet 3  . carvedilol (COREG) 12.5 MG tablet TAKE 1 TABLET BY MOUTH TWICE DAILY --  NEEDS  FOLLOW  UP  APPOINTMENT  FOR  FURTHER  REFILLS 180 tablet 3  . clopidogrel (PLAVIX) 75 MG tablet TAKE 1 TABLET BY MOUTH ONCE DAILY - NEED FOLLOW UP FOR REFILLS 90 tablet 3  . isosorbide mononitrate (IMDUR) 30 MG 24 hr tablet Take 1 tablet by mouth once daily 90 tablet 2  . lisinopril (ZESTRIL) 20 MG tablet TAKE 1 TABLET BY MOUTH TWICE DAILY . APPOINTMENT REQUIRED FOR FUTURE REFILLS 180 tablet 3  . nitroGLYCERIN (NITROSTAT) 0.4 MG SL tablet Place 1 tablet (0.4 mg total) under the tongue every 5 (five)  minutes as needed. 25 tablet 0  . Omega-3 Fatty Acids (FISH OIL) 1000 MG CAPS Take 1,000 mg by mouth 2 (two) times daily.    . vitamin E 1000 UNIT capsule Take 1,000 Units by mouth daily.         Assessment: Corey [name] with chest pain for heparin. No AC PTA.  Initial heparin level came back supratherapeutic at 1.07, on 1300 units/hr. Heparin is running in peripheral, level was drawn from opposite site. Hgb 12.4, plt 217. No s/sx of bleeding or infusion issues.    Goal of Therapy:  Heparin level 0.3-0.7 units/ml Monitor platelets by anticoagulation protocol: Yes   Plan:  Will hold for 1 hour Restart heparin at 1100 units/hr  Check heparin level in 6 hours Monitor daily HL, CBC, and for s/sx of bleeding   Antonietta Jewel, PharmD, [place] Pharmacist  Phone: [phone] 10/09/2019 4:00 PM  Please check AMION for all [place] phone numbers After 10:00 PM, call [place] [phone]

## 2019-10-09 NOTE — ED Notes (Signed)
Patient denies pain and is resting comfortably.  

## 2019-10-09 NOTE — ED Provider Notes (Addendum)
Patient just with some discomfort in the right arm.  We will do a third EKG.  Patient's pending transfer to cardiac telemetry at [facility] for presumed non-STEMI.  Patient had a troponin at about 5:30 in the morning that was 132.  Now up to 499.  The patient overall more comfortable.  Discussion for admission with the cardiac fellow that was on call overnight Dr. Katharina Caper was covering overnight.  But now tip in the Oval Linsey is the covering cardiologist.  Dr. Katharina Caper wanted her name switched to the chart which I have done for the admitting temporary orders.  [facility] should be coming to get the patient.  Dr. Katharina Caper did confirm he was covering last night.  But is not covering today.  Was appropriate to use Dr. Sheralyn Boatman name for the admission since it was prior to 7 in the morning.  He still would have been on call and responsible.   Fredia Sorrow, MD 10/09/19 1015   130 EKG is completed will review.   Fredia Sorrow, MD 10/09/19 1016  Repeat EKG still shows left bundle branch block.  Sinus rhythm otherwise heart rate 76.  No evidence of acute STEMI.  No significant change from previous EKGs.    Fredia Sorrow, MD 10/09/19 1052

## 2019-10-09 NOTE — ED Notes (Signed)
Date and time results received: 10/09/19  1001 (use smartphrase ".now" to insert current time)  Test: troponin Critical Value: 499  Name of Provider Notified: Oval Linsey MD  Orders Received? Or Actions Taken?: n/a

## 2019-10-09 NOTE — ED Notes (Signed)
Date and time results received: 10/09/19 0547 (use smartphrase ".now" to insert current time)  Test: troponin Critical Value: 132  Name of Provider Notified: wickline  Orders Received? Or Actions Taken?:

## 2019-10-09 NOTE — Psychosocial Assessment (Signed)
Cardiology at beside, pt arrived from [facility], Heparin 13 ml/h, pt chest pain free.

## 2019-10-09 NOTE — ED Triage Notes (Signed)
Pt C/O midsternal chest pain that started at 2330.

## 2019-10-10 ENCOUNTER — Other Ambulatory Visit: Payer: Self-pay | Admitting: *Deleted

## 2019-10-10 ENCOUNTER — Inpatient Hospital Stay (HOSPITAL_COMMUNITY): Payer: Medicare HMO

## 2019-10-10 ENCOUNTER — Encounter (HOSPITAL_COMMUNITY)
Admission: EM | Disposition: A | Discharge status: Home Health | Payer: Self-pay | Source: Home / Self Care | Attending: Cardiovascular Disease

## 2019-10-10 DIAGNOSIS — I25110 Atherosclerotic heart disease of native coronary artery with unstable angina pectoris: Secondary | ICD-10-CM

## 2019-10-10 DIAGNOSIS — I2510 Atherosclerotic heart disease of native coronary artery without angina pectoris: Secondary | ICD-10-CM

## 2019-10-10 DIAGNOSIS — I214 Non-ST elevation (NSTEMI) myocardial infarction: Secondary | ICD-10-CM

## 2019-10-10 HISTORY — PX: INTRAVASCULAR PRESSURE WIRE/FFR STUDY: CATH118243

## 2019-10-10 HISTORY — PX: LEFT HEART CATH AND CORONARY ANGIOGRAPHY: CATH118249

## 2019-10-10 LAB — COMPREHENSIVE METABOLIC PANEL
ALT: 19 U/L (ref 0–44)
AST: 24 U/L (ref 15–41)
Albumin: 3.4 g/dL — ABNORMAL LOW (ref 3.5–5.0)
Alkaline Phosphatase: 35 U/L — ABNORMAL LOW (ref 38–126)
Anion gap: 10 (ref 5–15)
BUN: 13 mg/dL (ref 8–23)
CO2: 24 mmol/L (ref 22–32)
Calcium: 9.1 mg/dL (ref 8.9–10.3)
Chloride: 108 mmol/L (ref 98–111)
Creatinine, Ser: 1.33 mg/dL — ABNORMAL HIGH (ref 0.61–1.24)
GFR calc Af Amer: 58 mL/min — ABNORMAL LOW (ref >60)
GFR calc non Af Amer: 50 mL/min — ABNORMAL LOW (ref >60)
Glucose, Bld: 103 mg/dL — ABNORMAL HIGH (ref 70–99)
Potassium: 3.9 mmol/L (ref 3.5–5.1)
Sodium: 142 mmol/L (ref 135–145)
Total Bilirubin: 0.9 mg/dL (ref 0.3–1.2)
Total Protein: 6.5 g/dL (ref 6.5–8.1)

## 2019-10-10 LAB — HEPARIN LEVEL (UNFRACTIONATED): Heparin Unfractionated: 0.96 IU/mL — ABNORMAL HIGH (ref 0.30–0.70)

## 2019-10-10 LAB — CBC
HCT: 36.4 % — ABNORMAL LOW (ref 39.0–52.0)
Hemoglobin: 11.7 g/dL — ABNORMAL LOW (ref 13.0–17.0)
MCH: 26.7 pg (ref 26.0–34.0)
MCHC: 32.1 g/dL (ref 30.0–36.0)
MCV: 83.1 fL (ref 80.0–100.0)
Platelets: 213 10*3/uL (ref 150–400)
RBC: 4.38 MIL/uL (ref 4.22–5.81)
RDW: 17.2 % — ABNORMAL HIGH (ref 11.5–15.5)
WBC: 7.5 10*3/uL (ref 4.0–10.5)
nRBC: 0 % (ref 0.0–0.2)

## 2019-10-10 LAB — ECHOCARDIOGRAM COMPLETE
Height: 68 in
Weight: 3761.6 oz

## 2019-10-10 LAB — LIPID PANEL
Cholesterol: 148 mg/dL (ref 0–200)
HDL: 30 mg/dL — ABNORMAL LOW (ref >40)
LDL Cholesterol: 96 mg/dL (ref 0–99)
Total CHOL/HDL Ratio: 4.9 RATIO
Triglycerides: 110 mg/dL (ref <150)
VLDL: 22 mg/dL (ref 0–40)

## 2019-10-10 LAB — POCT ACTIVATED CLOTTING TIME: Activated Clotting Time: 296 seconds

## 2019-10-10 SURGERY — LEFT HEART CATH AND CORONARY ANGIOGRAPHY
Anesthesia: LOCAL

## 2019-10-10 MED ORDER — LIDOCAINE HCL (PF) 1 % IJ SOLN
INTRAMUSCULAR | Status: DC | PRN
  Administered 2019-10-10: 2 mL

## 2019-10-10 MED ORDER — MIDAZOLAM HCL 2 MG/2ML IJ SOLN
INTRAMUSCULAR | Status: AC
  Filled 2019-10-10: qty 2

## 2019-10-10 MED ORDER — MIDAZOLAM HCL 2 MG/2ML IJ SOLN
INTRAMUSCULAR | Status: DC | PRN
  Administered 2019-10-10: 1 mg via INTRAVENOUS

## 2019-10-10 MED ORDER — VERAPAMIL HCL 2.5 MG/ML IV SOLN
INTRAVENOUS | Status: DC | PRN
  Administered 2019-10-10: 8 mL via INTRA_ARTERIAL

## 2019-10-10 MED ORDER — HEPARIN (PORCINE) 25000 UT/250ML-% IV SOLN
950.0000 [IU]/h | INTRAVENOUS | Status: DC
  Administered 2019-10-10 – 2019-10-13 (×4): 950 [IU]/h via INTRAVENOUS
  Filled 2019-10-10 (×3): qty 250

## 2019-10-10 MED ORDER — HEPARIN (PORCINE) IN NACL 1000-0.9 UT/500ML-% IV SOLN
INTRAVENOUS | Status: AC
  Filled 2019-10-10: qty 1000

## 2019-10-10 MED ORDER — IOHEXOL 350 MG/ML SOLN
INTRAVENOUS | Status: DC | PRN
  Administered 2019-10-10: 16:00:00 80 mL via INTRA_ARTERIAL

## 2019-10-10 MED ORDER — VERAPAMIL HCL 2.5 MG/ML IV SOLN
INTRAVENOUS | Status: AC
  Filled 2019-10-10: qty 2

## 2019-10-10 MED ORDER — FENTANYL CITRATE (PF) 100 MCG/2ML IJ SOLN
INTRAMUSCULAR | Status: DC | PRN
  Administered 2019-10-10: 25 ug via INTRAVENOUS

## 2019-10-10 MED ORDER — FENTANYL CITRATE (PF) 100 MCG/2ML IJ SOLN
INTRAMUSCULAR | Status: AC
  Filled 2019-10-10: qty 2

## 2019-10-10 MED ORDER — HEPARIN (PORCINE) IN NACL 1000-0.9 UT/500ML-% IV SOLN
INTRAVENOUS | Status: DC | PRN
  Administered 2019-10-10 (×3): 500 mL

## 2019-10-10 MED ORDER — SODIUM CHLORIDE 0.9 % IV SOLN: INTRAVENOUS | Status: AC

## 2019-10-10 MED ORDER — LIDOCAINE HCL (PF) 1 % IJ SOLN
INTRAMUSCULAR | Status: AC
  Filled 2019-10-10: qty 30

## 2019-10-10 MED ORDER — SODIUM CHLORIDE 0.9% FLUSH
3.0000 mL | Freq: Two times a day (BID) | INTRAVENOUS | Status: DC
  Administered 2019-10-11 – 2019-10-13 (×4): 3 mL via INTRAVENOUS

## 2019-10-10 MED ORDER — SODIUM CHLORIDE 0.9% FLUSH: 3.0000 mL | INTRAVENOUS | Status: DC | PRN

## 2019-10-10 MED ORDER — HEPARIN (PORCINE) IN NACL 1000-0.9 UT/500ML-% IV SOLN
INTRAVENOUS | Status: AC
  Filled 2019-10-10: qty 500

## 2019-10-10 MED ORDER — HEPARIN SODIUM (PORCINE) 1000 UNIT/ML IJ SOLN
INTRAMUSCULAR | Status: AC
  Filled 2019-10-10: qty 1

## 2019-10-10 MED ORDER — HEPARIN SODIUM (PORCINE) 1000 UNIT/ML IJ SOLN
INTRAMUSCULAR | Status: DC | PRN
  Administered 2019-10-10 (×2): 5000 [IU] via INTRAVENOUS

## 2019-10-10 MED ORDER — NITROGLYCERIN 1 MG/10 ML FOR IR/CATH LAB
INTRA_ARTERIAL | Status: AC
  Filled 2019-10-10: qty 10

## 2019-10-10 MED ORDER — HYDRALAZINE HCL 20 MG/ML IJ SOLN: 10.0000 mg | INTRAMUSCULAR | Status: AC | PRN

## 2019-10-10 MED ORDER — SODIUM CHLORIDE 0.9 % IV SOLN: 250.0000 mL | INTRAVENOUS | Status: DC | PRN

## 2019-10-10 MED ORDER — LABETALOL HCL 5 MG/ML IV SOLN: 10.0000 mg | INTRAVENOUS | Status: AC | PRN

## 2019-10-10 MED ORDER — NITROGLYCERIN 1 MG/10 ML FOR IR/CATH LAB
INTRA_ARTERIAL | Status: DC | PRN
  Administered 2019-10-10: 200 ug via INTRACORONARY

## 2019-10-10 SURGICAL SUPPLY — 12 items
CATH 5FR JL3.5 JR4 ANG PIG MP (CATHETERS) ×1 IMPLANT
CATH LAUNCHER 5F JR4 (CATHETERS) ×1 IMPLANT
DEVICE RAD COMP TR BAND LRG (VASCULAR PRODUCTS) ×1 IMPLANT
GLIDESHEATH SLEND SS 6F .021 (SHEATH) ×1 IMPLANT
GUIDEWIRE INQWIRE 1.5J.035X260 (WIRE) IMPLANT
GUIDEWIRE PRESSURE COMET II (WIRE) ×1 IMPLANT
INQWIRE 1.5J .035X260CM (WIRE) ×2
KIT ESSENTIALS PG (KITS) ×1 IMPLANT
KIT HEART LEFT (KITS) ×2 IMPLANT
PACK CARDIAC CATHETERIZATION (CUSTOM PROCEDURE TRAY) ×2 IMPLANT
TRANSDUCER W/STOPCOCK (MISCELLANEOUS) ×2 IMPLANT
TUBING CIL FLEX 10 FLL-RA (TUBING) ×2 IMPLANT

## 2019-10-10 NOTE — Progress Notes (Signed)
TR bend still on, 3 cc of air left. Pharmacy will restart heparin 2 hrs after TR bend is removed. Will pass it on to the night shift to notify pharmacy if TR bend is not off on day shift.

## 2019-10-10 NOTE — H&P (View-Only) (Signed)
Progress Note  Patient Name: Corey [name] Date of Encounter: 10/10/2019  Primary Cardiologist: Rozann Lesches, MD   Subjective   No recurrent chest pain overnight. ASA listed on allergies. He has tried ASA multiple times in past>> reports upset stomach with it.   Inpatient Medications    Scheduled Meds: . amLODipine  10 mg Oral Daily  . atorvastatin  80 mg Oral q1800  . carvedilol  12.5 mg Oral BID WC  . clopidogrel  75 mg Oral Daily  . isosorbide mononitrate  30 mg Oral Daily  . sodium chloride flush  3 mL Intravenous Q12H  . sodium chloride flush  3 mL Intravenous Q12H   Continuous Infusions: . sodium chloride    . sodium chloride    . sodium chloride 1 mL/kg/hr (10/10/19 1047)  . heparin 950 Units/hr (10/10/19 0800)   PRN Meds: sodium chloride, sodium chloride, acetaminophen, nitroGLYCERIN, ondansetron (ZOFRAN) IV, sodium chloride flush, sodium chloride flush   Vital Signs    Vitals:   10/10/19 0115 10/10/19 0534 10/10/19 0802 10/10/19 0907  BP: (!) 164/78 (!) 165/85 (!) 190/77 (!) 156/78  Pulse: 64 68 68 64  Resp: 19 19 17    Temp: 97.8 F (36.6 C) 98.1 F (36.7 C) 98 F (36.7 C)   TempSrc: Oral Oral Oral   SpO2: 99% 96% 98%   Weight: 106.6 kg     Height:        Intake/Output Summary (Last 24 hours) at 10/10/2019 1057 Last data filed at 10/10/2019 0444 Gross per 24 hour  Intake 315.28 ml  Output 600 ml  Net -284.72 ml   Last 3 Weights 10/10/2019 10/09/2019 10/09/2019  Weight (lbs) 235 lb 1.6 oz 236 lb 1 oz 241 lb  Weight (kg) 106.641 kg 107.077 kg 109.317 kg      Telemetry    SR at rate of 60s, NSVT (6 beats) - Personally Reviewed  ECG    NSR., LBBB, TWi in leads V3 to V6 - Personally Reviewed  Physical Exam   GEN: No acute distress.   Neck: No JVD Cardiac: RRR, no murmurs, rubs, or gallops.  Respiratory: Clear to auscultation bilaterally. GI: Soft, nontender, non-distended  MS: No edema; No deformity. Neuro:  Nonfocal  Psych:  Normal affect   Labs    High Sensitivity Troponin:   Recent Labs  Lab 10/09/19 0252 10/09/19 0432 10/09/19 0821 10/09/19 1200  TROPONINIHS 13 132* 499* 705*      Chemistry Recent Labs  Lab 10/09/19 0252 10/10/19 0511  NA 140 142  K 3.8 3.9  CL 108 108  CO2 24 24  GLUCOSE 124* 103*  BUN 17 13  CREATININE 1.37* 1.33*  CALCIUM 9.3 9.1  PROT 7.6 6.5  ALBUMIN 4.0 3.4*  AST 23 24  ALT 22 19  ALKPHOS 39 35*  BILITOT 0.5 0.9  GFRNONAA 48* 50*  GFRAA 56* 58*  ANIONGAP 8 10     Hematology Recent Labs  Lab 10/09/19 0252 10/10/19 0511  WBC 5.7 7.5  RBC 4.63 4.38  HGB 12.4* 11.7*  HCT 39.1 36.4*  MCV 84.4 83.1  MCH 26.8 26.7  MCHC 31.7 32.1  RDW 17.3* 17.2*  PLT 217 213    Radiology    DG Chest Port 1 View  Result Date: 10/09/2019 CLINICAL DATA:  Chest pain. EXAM: PORTABLE CHEST 1 VIEW COMPARISON:  May 16, 2014 FINDINGS: The heart size and mediastinal contours are within normal limits. Both lungs are clear. Degenerative changes seen throughout the  thoracic spine. IMPRESSION: No active disease. Electronically Signed   By: Virgina Norfolk M.D.   On: 10/09/2019 03:23    Cardiac Studies   Pending echo and cath   Patient Profile      Corey [name] is a 84 y.o. male with past medical history of CAD (s/p BMS to RCA and staged PCI to LCx in 2009, DES to mid-RCA and DES to D1 in 04/2014, NST in 02/2018 showing scar with no current ischemia), anaphylaxis to aspirin, LBBB, HTN, CKD stage II, mild LV dysfucntion and HLD who presented to [facility] ED on 10/09/2019 for evaluation of chest pain and troponin returned abnormal. Transferred to [facility] for further evaluation.   Assessment & Plan    1. NSTEMI -  HS troponin 132--> 499--> 705 (no check since yesterday). ECG with chronic LBBB and no acute ST/TW changes. - Allergic to ASA - For LHC later today>>> if required stenting >>need to address antiplatelet therapy.  - Contineu Plavix, Imdur BB and statin    2. HTN - Norvasc increased to 10mg  qd this admission - On Coreg 12.5mg  BID - Follow BP  3. HLD - 10/10/2019: Cholesterol 148; HDL 30; LDL Cholesterol 96; Triglycerides 110; VLDL 22 - Continue Lipitor 80mg  qd  4. CKD II - Scr stable at 1.3  For questions or updates, please contact [place] Please consult [email or web address] for contact info under        Signed, Leanor Kail, PA  10/10/2019, 10:57 AM    Agree with note by Corey Lis PA-C  Corey [name]  was admitted with unstable angina/non-STEMI with a rising troponin now in the 700 range.  He has a history of known CAD status post stenting in the past back in 2009 and 2015 of the RCA and circumflex.  He also has left bundle branch block, hypertension and stage II CKD.  He has mild LV dysfunction.  He is currently on IV heparin.  He said no recurrent chest pain.  His renal function is stable.  Plan cardiac catheterization today. I have reviewed the risks, indications, and alternatives to cardiac catheterization, possible angioplasty, and stenting with the patient. Risks include but are not limited to bleeding, infection, vascular injury, stroke, myocardial infection, arrhythmia, kidney injury, radiation-related injury in the case of prolonged fluoroscopy use, emergency cardiac surgery, and death. The patient understands the risks of serious complication is 1-2 in [id number] with diagnostic cardiac cath and 1-2% or less with angioplasty/stenting.   Lorretta Harp, M.D., [place], [facility], [place] [address] [place], [place]  [address]  [phone] 10/10/2019 11:22 AM

## 2019-10-10 NOTE — Progress Notes (Addendum)
Progress Note  Patient Name: Corey [name] Date of Encounter: 10/10/2019  Primary Cardiologist: Rozann Lesches, MD   Subjective   No recurrent chest pain overnight. ASA listed on allergies. He has tried ASA multiple times in past>> reports upset stomach with it.   Inpatient Medications    Scheduled Meds: . amLODipine  10 mg Oral Daily  . atorvastatin  80 mg Oral q1800  . carvedilol  12.5 mg Oral BID WC  . clopidogrel  75 mg Oral Daily  . isosorbide mononitrate  30 mg Oral Daily  . sodium chloride flush  3 mL Intravenous Q12H  . sodium chloride flush  3 mL Intravenous Q12H   Continuous Infusions: . sodium chloride    . sodium chloride    . sodium chloride 1 mL/kg/hr (10/10/19 1047)  . heparin 950 Units/hr (10/10/19 0800)   PRN Meds: sodium chloride, sodium chloride, acetaminophen, nitroGLYCERIN, ondansetron (ZOFRAN) IV, sodium chloride flush, sodium chloride flush   Vital Signs    Vitals:   10/10/19 0115 10/10/19 0534 10/10/19 0802 10/10/19 0907  BP: (!) 164/78 (!) 165/85 (!) 190/77 (!) 156/78  Pulse: 64 68 68 64  Resp: 19 19 17    Temp: 97.8 F (36.6 C) 98.1 F (36.7 C) 98 F (36.7 C)   TempSrc: Oral Oral Oral   SpO2: 99% 96% 98%   Weight: 106.6 kg     Height:        Intake/Output Summary (Last 24 hours) at 10/10/2019 1057 Last data filed at 10/10/2019 0444 Gross per 24 hour  Intake 315.28 ml  Output 600 ml  Net -284.72 ml   Last 3 Weights 10/10/2019 10/09/2019 10/09/2019  Weight (lbs) 235 lb 1.6 oz 236 lb 1 oz 241 lb  Weight (kg) 106.641 kg 107.077 kg 109.317 kg      Telemetry    SR at rate of 60s, NSVT (6 beats) - Personally Reviewed  ECG    NSR., LBBB, TWi in leads V3 to V6 - Personally Reviewed  Physical Exam   GEN: No acute distress.   Neck: No JVD Cardiac: RRR, no murmurs, rubs, or gallops.  Respiratory: Clear to auscultation bilaterally. GI: Soft, nontender, non-distended  MS: No edema; No deformity. Neuro:  Nonfocal  Psych:  Normal affect   Labs    High Sensitivity Troponin:   Recent Labs  Lab 10/09/19 0252 10/09/19 0432 10/09/19 0821 10/09/19 1200  TROPONINIHS 13 132* 499* 705*      Chemistry Recent Labs  Lab 10/09/19 0252 10/10/19 0511  NA 140 142  K 3.8 3.9  CL 108 108  CO2 24 24  GLUCOSE 124* 103*  BUN 17 13  CREATININE 1.37* 1.33*  CALCIUM 9.3 9.1  PROT 7.6 6.5  ALBUMIN 4.0 3.4*  AST 23 24  ALT 22 19  ALKPHOS 39 35*  BILITOT 0.5 0.9  GFRNONAA 48* 50*  GFRAA 56* 58*  ANIONGAP 8 10     Hematology Recent Labs  Lab 10/09/19 0252 10/10/19 0511  WBC 5.7 7.5  RBC 4.63 4.38  HGB 12.4* 11.7*  HCT 39.1 36.4*  MCV 84.4 83.1  MCH 26.8 26.7  MCHC 31.7 32.1  RDW 17.3* 17.2*  PLT 217 213    Radiology    DG Chest Port 1 View  Result Date: 10/09/2019 CLINICAL DATA:  Chest pain. EXAM: PORTABLE CHEST 1 VIEW COMPARISON:  May 16, 2014 FINDINGS: The heart size and mediastinal contours are within normal limits. Both lungs are clear. Degenerative changes seen throughout the  thoracic spine. IMPRESSION: No active disease. Electronically Signed   By: Virgina Norfolk M.D.   On: 10/09/2019 03:23    Cardiac Studies   Pending echo and cath   Patient Profile      Corey [name] is a 84 y.o. 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 Transferred to [facility] for further evaluation.   Assessment & Plan    1. NSTEMI -  HS troponin 132--> 499--> 705 (no check since yesterday). ECG with chronic LBBB and no acute ST/TW changes. - Allergic to ASA - For LHC later today>>> if required stenting >>need to address antiplatelet therapy.  - Contineu Plavix, Imdur BB and statin    2. HTN - Norvasc increased to 10mg  qd this admission - On Coreg 12.5mg  BID - Follow BP  3. HLD - 10/10/2019: Cholesterol 148; HDL 30; LDL Cholesterol 96; Triglycerides 110; VLDL 22 - Continue Lipitor 80mg  qd  4. CKD II - Scr stable at 1.3  For questions or updates, please contact [place] Please consult [email or web address] for contact info under        Signed, Leanor Kail, PA  10/10/2019, 10:57 AM    Agree with note by [name] PA-C  Corey. [name]  was admitted with unstable angina/non-STEMI with a rising troponin now in the 700 range.  He has a history of known CAD status post stenting in the past back in 2009 and 2015 of the RCA and circumflex.  He also has left bundle branch block, hypertension and stage II CKD.  He has mild LV dysfunction.  He is currently on IV heparin.  He said no recurrent chest pain.  His renal function is stable.  Plan cardiac catheterization today. I have reviewed the risks, indications, and alternatives to cardiac catheterization, possible angioplasty, and stenting with the patient. Risks include but are not limited to bleeding, infection, vascular injury, stroke, myocardial infection, arrhythmia, kidney injury, radiation-related injury in the case of prolonged fluoroscopy use, emergency cardiac surgery, and death. The patient understands the risks of serious complication is 1-2 in [id number] with diagnostic cardiac cath and 1-2% or less with angioplasty/stenting.   Lorretta Harp, M.D., [place], [facility], [place] [address] [place], [place]  [address]  [phone] 10/10/2019 11:22 AM

## 2019-10-10 NOTE — Consult Note (Signed)
[address]       [place] [address]             [phone]      Cardiothoracic Surgery Consultation  Reason for Consult: Severe multivessel coronary artery disease Referring Physician: Dr. Gerald Stabs End  Corey [name] is an 84 y.o. male.  HPI:   The patient is an 84 year old gentleman with a history of hypertension, hyperlipidemia, and coronary disease status post multiple PCI's.  He had a non-ST segment elevation MI in 2009 and underwent BMS to the RCA and staged PCI to the left circumflex.  04/2014 he underwent stenting of the first diagonal and mid RCA.  He said that he was doing fairly well until about 2 weeks ago when he had his first COVID-19 vaccine.  He said that since then he has had nightly abdominal discomfort and then had 10 out of 10 chest pain on the night of admission that felt like an elephant sitting on his chest.  This pain was similar to pain that he had with prior heart attacks.  His daughter took him to the [facility] emergency department on the morning of 10/09/2019.  His initial at HS troponin was 13.  Subsequent levels were 132, 499, and 705.  Electrocardiogram showed sinus rhythm with known left bundle branch block.  Cardiac catheterization today showed 90% ostial and proximal LAD stenosis followed by 75% stenosis at the bifurcation of the previously stented diagonal which was patent.  There is mild to moderate nonobstructive disease in the intermediate and RCA.  Previous stents in the diagonal, left circumflex, and RCA were patent.  Left ventricular ejection fraction was 45% with normal filling pressures.  Echocardiogram showed an ejection fraction of 45 to 50% with mild LVH.  There is grade 1 diastolic dysfunction.  There is no significant valvular abnormality.  The patient's daughter was conferenced in on her cell phone during my consultation.  The patient reports a several month history of fatigue and exertional shortness of breath  which has been progressing.  He is to go to the [facility] regularly prior to the COVID-19 pandemic but has not been very active since over the past year.  He does walk around his neighborhood some.  He has not been able to walk even short distances without significant shortness of breath.  Past Medical History:  Diagnosis Date  . Anaphylaxis    Aspirin  . Coronary atherosclerosis of native coronary artery    a. BMS to RCA and LCx 10/09, DES to D1 and RCA 04/2014  . Essential hypertension   . Hyperlipidemia   . Myocardial infarction ([place]) 1998  . NSTEMI (non-ST elevated myocardial infarction) ([place]) 03/2008  . Seasonal allergies     Past Surgical History:  Procedure Laterality Date  . CARDIAC CATHETERIZATION     Cath 05/18/2014 DES to D1 and mid RCA  . COLONOSCOPY  05/01/2011   Procedure: COLONOSCOPY;  Surgeon: Daneil Dolin, MD;  Location: [facility];  Service: Endoscopy;  Laterality: N/A;  11:30  . CORONARY ANGIOPLASTY WITH STENT PLACEMENT  07/1996; 03/2008; 05/18/2014   "?3; ?2; 2"  . LEFT HEART CATHETERIZATION WITH CORONARY ANGIOGRAM N/A 05/18/2014   Procedure: LEFT HEART CATHETERIZATION WITH CORONARY ANGIOGRAM;  Surgeon: Blane Ohara, MD;  Location: [facility] CATH LAB;  Service: Cardiovascular;  Laterality: N/A;    Family History  Problem Relation Age of Onset  . Hypertension Other   . Colon cancer Neg Hx  Social History:  reports that he quit smoking about 23 years ago. His smoking use included cigarettes. He has a 90.00 pack-year smoking history. He has never used smokeless tobacco. He reports that he does not drink alcohol or use drugs.  Allergies:  Allergies  Allergen Reactions  . Aspirin Anaphylaxis  . Other     Foods that are high in acid content cause sores and a rash.   . Penicillins Nausea And Vomiting    Medications:  I have reviewed the patient's current medications. Prior to Admission:  Medications Prior to Admission  Medication Sig Dispense Refill Last  Dose  . amLODipine (NORVASC) 5 MG tablet Take 1 tablet (5 mg total) by mouth daily. 90 tablet 3 10/08/2019 at Unknown time  . Ascorbic Acid (VITAMIN C) 1000 MG tablet Take 1,000 mg by mouth daily.     10/08/2019 at Unknown time  . atorvastatin (LIPITOR) 80 MG tablet Take 1 tablet daily at 6 pm. 90 tablet 3 10/08/2019 at Unknown time  . carvedilol (COREG) 12.5 MG tablet TAKE 1 TABLET BY MOUTH TWICE DAILY --  NEEDS  FOLLOW  UP  APPOINTMENT  FOR  FURTHER  REFILLS 180 tablet 3 10/08/2019 at 2000  . clopidogrel (PLAVIX) 75 MG tablet TAKE 1 TABLET BY MOUTH ONCE DAILY - NEED FOLLOW UP FOR REFILLS 90 tablet 3 10/08/2019 at 0800  . isosorbide mononitrate (IMDUR) 30 MG 24 hr tablet Take 1 tablet by mouth once daily 90 tablet 2 10/08/2019 at Unknown time  . lisinopril (ZESTRIL) 20 MG tablet TAKE 1 TABLET BY MOUTH TWICE DAILY . APPOINTMENT REQUIRED FOR FUTURE REFILLS 180 tablet 3 10/08/2019 at Unknown time  . nitroGLYCERIN (NITROSTAT) 0.4 MG SL tablet Place 1 tablet (0.4 mg total) under the tongue every 5 (five) minutes as needed. 25 tablet 0   . Omega-3 Fatty Acids (FISH OIL) 1000 MG CAPS Take 1,000 mg by mouth 2 (two) times daily.   10/08/2019 at Unknown time  . vitamin E 1000 UNIT capsule Take 1,000 Units by mouth daily.     10/08/2019 at Unknown time   Scheduled: . amLODipine  10 mg Oral Daily  . atorvastatin  80 mg Oral q1800  . carvedilol  12.5 mg Oral BID WC  . isosorbide mononitrate  30 mg Oral Daily  . sodium chloride flush  3 mL Intravenous Q12H  . sodium chloride flush  3 mL Intravenous Q12H  . sodium chloride flush  3 mL Intravenous Q12H   Continuous: . sodium chloride    . sodium chloride 75 mL/hr at 10/10/19 1640  . sodium chloride     [id number] chloride, sodium chloride, acetaminophen, hydrALAZINE, labetalol, nitroGLYCERIN, ondansetron (ZOFRAN) IV, sodium chloride flush, sodium chloride flush Anti-infectives (From admission, onward)   None      Results for orders placed or performed during  the hospital encounter of 10/09/19 (from the past 48 hour(s))  APTT     Status: None   Collection Time: 10/09/19  2:42 AM  Result Value Ref Range   aPTT 33 24 - 36 seconds    Comment: Performed at [facility], [address] [place], [place] [address]  Protime-INR     Status: None   Collection Time: 10/09/19  2:42 AM  Result Value Ref Range   Prothrombin Time 12.8 11.4 - 15.2 seconds   INR 1.0 0.8 - 1.2    Comment: (NOTE) INR goal varies based on device and disease states. Performed at [facility], [address] [place],  [place] [id number]   Basic metabolic panel     Status: Abnormal   Collection Time: 10/09/19  2:52 AM  Result Value Ref Range   Sodium 140 135 - 145 mmol/L   Potassium 3.8 3.5 - 5.1 mmol/L   Chloride 108 98 - 111 mmol/L   CO2 24 22 - 32 mmol/L   Glucose, Bld 124 (H) 70 - 99 mg/dL    Comment: Glucose reference range applies only to samples taken after fasting for at least 8 hours.   BUN 17 8 - 23 mg/dL   Creatinine, Ser 1.37 (H) 0.61 - 1.24 mg/dL   Calcium 9.3 8.9 - 10.3 mg/dL   GFR calc non Af Amer 48 (L) >60 mL/min   GFR calc Af Amer 56 (L) >60 mL/min   Anion gap 8 5 - 15    Comment: Performed at [facility], [address] [place] [address] [place], [place] [address]  CBC with Differential/Platelet     Status: Abnormal   Collection Time: 10/09/19  2:52 AM  Result Value Ref Range   WBC 5.7 4.0 - 10.5 K/uL   RBC 4.63 4.22 - 5.81 MIL/uL   Hemoglobin 12.4 (L) 13.0 - 17.0 g/dL   HCT 39.1 39.0 - 52.0 %   MCV 84.4 80.0 - 100.0 fL   MCH 26.8 26.0 - 34.0 pg   MCHC 31.7 30.0 - 36.0 g/dL   RDW 17.3 (H) 11.5 - 15.5 %   Platelets 217 150 - 400 K/uL   nRBC 0.0 0.0 - 0.2 %   Neutrophils Relative % 35 %   Neutro Abs 2.0 1.7 - 7.7 K/uL   Lymphocytes Relative 44 %   Lymphs Abs 2.5 0.7 - 4.0 K/uL   Monocytes Relative 14 %   Monocytes Absolute 0.8 0.1 - 1.0 K/uL   Eosinophils Relative 6 %   Eosinophils Absolute 0.4 0.0 - 0.5 K/uL   Basophils Relative 1 %   Basophils Absolute 0.0  0.0 - 0.1 K/uL   Immature Granulocytes 0 %   Abs Immature Granulocytes 0.00 0.00 - 0.07 K/uL    Comment: Performed at [facility], [address] [place], [place] [address]  Troponin I (High Sensitivity)     Status: None   Collection Time: 10/09/19  2:52 AM  Result Value Ref Range   Troponin I (High Sensitivity) 13 <18 ng/L    Comment: (NOTE) Elevated high sensitivity troponin I (hsTnI) values and significant  changes across serial measurements may suggest ACS but many other  chronic and acute conditions are known to elevate hsTnI results.  Refer to the "Links" section for chest pain algorithms and additional  guidance. Performed at [facility], [address] [place], [place] [address]   Lipase, blood     Status: None   Collection Time: 10/09/19  2:52 AM  Result Value Ref Range   Lipase 33 11 - 51 U/L    Comment: Performed at [facility], [address], [place], [place] [address]  Hepatic function panel     Status: None   Collection Time: 10/09/19  2:52 AM  Result Value Ref Range   Total Protein 7.6 6.5 - 8.1 g/dL   Albumin 4.0 3.5 - 5.0 g/dL   AST 23 15 - 41 U/L   ALT 22 0 - 44 U/L   Alkaline Phosphatase 39 38 - 126 U/L   Total Bilirubin 0.5 0.3 - 1.2 mg/dL   Bilirubin, Direct <0.1 0.0 - 0.2 mg/dL   Indirect Bilirubin NOT CALCULATED 0.3 -  0.9 mg/dL    Comment: Performed at [facility], [address], [place], [place] [address]  Troponin I (High Sensitivity)     Status: Abnormal   Collection Time: 10/09/19  4:32 AM  Result Value Ref Range   Troponin I (High Sensitivity) 132 (HH) <18 ng/L    Comment: CRITICAL RESULT CALLED TO, READ BACK BY AND VERIFIED WITH: TIFFANY EASTER,RN @0545  10/09/2019 KAY (NOTE) Elevated high sensitivity troponin I (hsTnI) values and significant  changes across serial measurements may suggest ACS but many other  chronic and acute conditions are known to elevate hsTnI results.  Refer to the Links section for chest pain algorithms and additional    guidance. Performed at [facility], [address], [place], [place] [address]   SARS CORONAVIRUS 2 (TAT 6-24 HRS) Nasopharyngeal Nasopharyngeal Swab     Status: None   Collection Time: 10/09/19  5:50 AM   Specimen: Nasopharyngeal Swab  Result Value Ref Range   SARS Coronavirus 2 NEGATIVE NEGATIVE    Comment: (NOTE) SARS-CoV-2 target nucleic acids are NOT DETECTED. The SARS-CoV-2 RNA is generally detectable in upper and lower respiratory specimens during the acute phase of infection. Negative results do not preclude SARS-CoV-2 infection, do not rule out co-infections with other pathogens, and should not be used as the sole basis for treatment or other patient management decisions. Negative results must be combined with clinical observations, patient history, and epidemiological information. The expected result is Negative. Fact Sheet for Patients: SugarRoll.be Fact Sheet for Healthcare Providers: https://www.woods-mathews.com/ This test is not yet approved or cleared by the [place] FDA and  has been authorized for detection and/or diagnosis of SARS-CoV-2 by FDA under an Emergency Use Authorization (EUA). This EUA will remain  in effect (meaning this test can be used) for the duration of the COVID-19 declaration under Section 56 4(b)(1) of the Act, 21 U.S.C. section 360bbb-3(b)(1), unless the authorization is terminated or revoked sooner. Performed at [facility], [place] [address], [place], [place] [address]   Troponin I (High Sensitivity)     Status: Abnormal   Collection Time: 10/09/19  8:21 AM  Result Value Ref Range   Troponin I (High Sensitivity) 499 (HH) <18 ng/L    Comment: CRITICAL RESULT CALLED TO, READ BACK BY AND VERIFIED WITH: RACHEAL HOLCOMB,RN @0902  04/11/2021KAY (NOTE) Elevated high sensitivity troponin I (hsTnI) values and significant  changes across serial measurements may suggest ACS but many other  chronic  and acute conditions are known to elevate hsTnI results.  Refer to the Links section for chest pain algorithms and additional  guidance. Performed at [facility], [address], [place], [place] [address]   Troponin I (High Sensitivity)     Status: Abnormal   Collection Time: 10/09/19 12:00 PM  Result Value Ref Range   Troponin I (High Sensitivity) 705 (HH) <18 ng/L    Comment: CRITICAL RESULT CALLED TO, READ BACK BY AND VERIFIED WITH: RACHEAL HOLCOMB,RN @1314  10/09/2019 KAY (NOTE) Elevated high sensitivity troponin I (hsTnI) values and significant  changes across serial measurements may suggest ACS but many other  chronic and acute conditions are known to elevate hsTnI results.  Refer to the Links section for chest pain algorithms and additional  guidance. Performed at [facility], [address], [place], [place] [address]   Heparin level (unfractionated)     Status: Abnormal   Collection Time: 10/09/19  3:17 PM  Result Value Ref Range   Heparin Unfractionated 1.07 (H) 0.30 - 0.70 IU/mL  Comment: (NOTE) If heparin results are below expected values, and patient dosage has  been confirmed, suggest follow up testing of antithrombin III levels. Performed at [facility], [place] [address], [place], [place] [address]   Protime-INR     Status: None   Collection Time: 10/09/19  3:17 PM  Result Value Ref Range   Prothrombin Time 14.3 11.4 - 15.2 seconds   INR 1.1 0.8 - 1.2    Comment: (NOTE) INR goal varies based on device and disease states. Performed at [facility], [place] [address] [place], [place] [address]   Heparin level (unfractionated)     Status: None   Collection Time: 10/09/19 10:24 PM  Result Value Ref Range   Heparin Unfractionated 0.69 0.30 - 0.70 IU/mL    Comment: (NOTE) If heparin results are below expected values, and patient dosage has  been confirmed, suggest follow up testing of antithrombin III levels. Performed at [facility], [place] [address] [place], [place] [address]   Heparin level (unfractionated)     Status: Abnormal   Collection Time: 10/10/19  5:11 AM  Result Value Ref Range   Heparin Unfractionated 0.96 (H) 0.30 - 0.70 IU/mL    Comment: (NOTE) If heparin results are below expected values, and patient dosage has  been confirmed, suggest follow up testing of antithrombin III levels. Performed at [facility], [place] [address], [place], [place] [address]   CBC     Status: Abnormal   Collection Time: 10/10/19  5:11 AM  Result Value Ref Range   WBC 7.5 4.0 - 10.5 K/uL   RBC 4.38 4.22 - 5.81 MIL/uL   Hemoglobin 11.7 (L) 13.0 - 17.0 g/dL   HCT 36.4 (L) 39.0 - 52.0 %   MCV 83.1 80.0 - 100.0 fL   MCH 26.7 26.0 - 34.0 pg   MCHC 32.1 30.0 - 36.0 g/dL   RDW 17.2 (H) 11.5 - 15.5 %   Platelets 213 150 - 400 K/uL   nRBC 0.0 0.0 - 0.2 %    Comment: Performed at [facility], [place] [address] [place], [place] [address]  Comprehensive metabolic panel     Status: Abnormal   Collection Time: 10/10/19  5:11 AM  Result Value Ref Range   Sodium 142 135 - 145 mmol/L   Potassium 3.9 3.5 - 5.1 mmol/L   Chloride 108 98 - 111 mmol/L   CO2 24 22 - 32 mmol/L   Glucose, Bld 103 (H) 70 - 99 mg/dL    Comment: Glucose reference range applies only to samples taken after fasting for at least 8 hours.   BUN 13 8 - 23 mg/dL   Creatinine, Ser 1.33 (H) 0.61 - 1.24 mg/dL   Calcium 9.1 8.9 - 10.3 mg/dL   Total Protein 6.5 6.5 - 8.1 g/dL   Albumin 3.4 (L) 3.5 - 5.0 g/dL   AST 24 15 - 41 U/L   ALT 19 0 - 44 U/L   Alkaline Phosphatase 35 (L) 38 - 126 U/L   Total Bilirubin 0.9 0.3 - 1.2 mg/dL   GFR calc non Af Amer 50 (L) >60 mL/min   GFR calc Af Amer 58 (L) >60 mL/min   Anion gap 10 5 - 15    Comment: Performed at [facility] [address], [place], [place] [address]  Lipid panel     Status: Abnormal   Collection Time: 10/10/19  5:11 AM  Result Value  Ref Range   Cholesterol 148 0 - 200 mg/dL   Triglycerides 110  <150 mg/dL   HDL 30 (L) >40 mg/dL   Total CHOL/HDL Ratio 4.9 RATIO   VLDL 22 0 - 40 mg/dL   LDL Cholesterol 96 0 - 99 mg/dL    Comment:        Total Cholesterol/HDL:CHD Risk Coronary Heart Disease Risk Table                     Men   Women  1/2 Average Risk   3.4   3.3  Average Risk       5.0   4.4  2 X Average Risk   9.6   7.1  3 X Average Risk  23.4   11.0        Use the calculated Patient Ratio above and the CHD Risk Table to determine the patient's CHD Risk.        ATP III CLASSIFICATION (LDL):  <100     mg/dL   Optimal  100-129  mg/dL   Near or Above                    Optimal  130-159  mg/dL   Borderline  160-189  mg/dL   High  >190     mg/dL   Very High Performed at [facility] [address], [place], [place] [address]   POCT Activated clotting time     Status: None   Collection Time: 10/10/19  4:00 PM  Result Value Ref Range   Activated Clotting Time 296 seconds    CARDIAC CATHETERIZATION  Result Date: 10/10/2019 Conclusions: 1. Severe single vessel coronary artery disease with 90% ostial/proximal LAD disease followed by 75% stenosis at the bifurcation with previously stented D1 ([place] 0,1,0). 2. Mild to moderate, non-obstructive disease involving ramus intermedius and proximal RCA (DFR 0.94). 3. Previously placed stents in D1, LCx, proximal RCA, and mid RCA are patent, with mild in-stent restenosis noted in the LCx and mid RCA. 4. Mildly to moderately reduced left ventricular contraction (LVEF ~45%) with normal filling pressure. Recommendations: 1. Images were reviewed with Dr. Gwenlyn Found.  We have agreed that surgical revascularization may be preferential for treatment of complex LAD disease.  PCI to ostial and proximal LAD is feasible but could jeopardize ramus intermedius/LCx and D1. 2. Discontinue clopidogrel pending cardiac surgery consultation.  Heparin infusion to be restarted 2 hours after TR band deflation. 3. Aggressive secondary prevention of coronary  artery disease. 4. Follow-up echocardiogram interpretation. Nelva Bush, MD [facility]   DG Chest Port 1 View  Result Date: 10/09/2019 CLINICAL DATA:  Chest pain. EXAM: PORTABLE CHEST 1 VIEW COMPARISON:  May 16, 2014 FINDINGS: The heart size and mediastinal contours are within normal limits. Both lungs are clear. Degenerative changes seen throughout the thoracic spine. IMPRESSION: No active disease. Electronically Signed   By: Virgina Norfolk M.D.   On: 10/09/2019 03:23   ECHOCARDIOGRAM COMPLETE  Result Date: 10/10/2019    ECHOCARDIOGRAM REPORT   Patient Name:   Corey [name] Date of Exam: 10/10/2019 Medical Rec #:  [id number]          Height:       68.0 in Accession #:    [id number]         Weight:       235.1 lb Date of Birth:  09/02/1935         BSA:  2.189 m Patient Age:    30 years           BP:           166/74 mmHg Patient Gender: M                  HR:           62 bpm. Exam Location:  Inpatient Procedure: 2D Echo Indications:    NSTEMI I21.4  History:        Patient has prior history of Echocardiogram examinations, most                 recent 03/19/2018. Previous Myocardial Infarction; Risk                 Factors:Hypertension and Dyslipidemia.  Sonographer:    Mikki Santee RDCS (AE) Referring Phys: [id number] [place]  1. Left ventricular ejection fraction, by estimation, is 45 to 50%. The left ventricle has mildly decreased function. The left ventricle demonstrates global hypokinesis. There is mild concentric left ventricular hypertrophy. Left ventricular diastolic parameters are consistent with Grade I diastolic dysfunction (impaired relaxation).  2. Right ventricular systolic function is normal. The right ventricular size is normal. There is normal pulmonary artery systolic pressure.  3. The mitral valve is normal in structure. Trivial mitral valve regurgitation. No evidence of mitral stenosis.  4. The aortic valve is normal in structure. Aortic  valve regurgitation is not visualized. No aortic stenosis is present.  5. Aortic dilatation noted. There is mild dilatation at the level of the sinuses of Valsalva measuring 37 mm.  6. The inferior vena cava is normal in size with <50% respiratory variability, suggesting right atrial pressure of 8 mmHg. FINDINGS  Left Ventricle: Left ventricular ejection fraction, by estimation, is 45 to 50%. The left ventricle has mildly decreased function. The left ventricle demonstrates global hypokinesis. The left ventricular internal cavity size was normal in size. There is  mild concentric left ventricular hypertrophy. Left ventricular diastolic parameters are consistent with Grade I diastolic dysfunction (impaired relaxation). Indeterminate filling pressures. Right Ventricle: The right ventricular size is normal. No increase in right ventricular wall thickness. Right ventricular systolic function is normal. There is normal pulmonary artery systolic pressure. The tricuspid regurgitant velocity is 1.36 m/s, and  with an assumed right atrial pressure of 8 mmHg, the estimated right ventricular systolic pressure is 123XX123 mmHg. Left Atrium: Left atrial size was normal in size. Right Atrium: Right atrial size was normal in size. Pericardium: There is no evidence of pericardial effusion. Mitral Valve: The mitral valve is normal in structure. Normal mobility of the mitral valve leaflets. Trivial mitral valve regurgitation. No evidence of mitral valve stenosis. Tricuspid Valve: The tricuspid valve is normal in structure. Tricuspid valve regurgitation is trivial. No evidence of tricuspid stenosis. Aortic Valve: The aortic valve is normal in structure. Aortic valve regurgitation is not visualized. No aortic stenosis is present. Pulmonic Valve: The pulmonic valve was normal in structure. Pulmonic valve regurgitation is not visualized. No evidence of pulmonic stenosis. Aorta: Aortic dilatation noted. There is mild dilatation at the level of  the sinuses of Valsalva measuring 37 mm. Venous: The inferior vena cava is normal in size with less than 50% respiratory variability, suggesting right atrial pressure of 8 mmHg. IAS/Shunts: No atrial level shunt detected by color flow Doppler.  LEFT VENTRICLE PLAX 2D LVIDd:         4.40 cm  Diastology LVIDs:  3.20 cm  LV e' lateral:   4.24 cm/s LV PW:         1.10 cm  LV E/e' lateral: 12.2 LV IVS:        1.20 cm  LV e' medial:    4.57 cm/s LVOT diam:     2.40 cm  LV E/e' medial:  11.3 LV SV:         87 LV SV Index:   40 LVOT Area:     4.52 cm  RIGHT VENTRICLE RV S prime:     14.60 cm/s TAPSE (M-mode): 2.2 cm LEFT ATRIUM             Index       RIGHT ATRIUM           Index LA diam:        3.30 cm 1.51 cm/m  RA Area:     13.90 cm LA Vol (A2C):   57.3 ml 26.17 ml/m RA Volume:   27.90 ml  12.74 ml/m LA Vol (A4C):   36.5 ml 16.67 ml/m LA Biplane Vol: 45.6 ml 20.83 ml/m  AORTIC VALVE LVOT Vmax:   85.50 cm/s LVOT Vmean:  47.000 cm/s LVOT VTI:    0.193 m  AORTA Ao Root diam: 3.70 cm MITRAL VALVE                TRICUSPID VALVE MV Area (PHT): 3.12 cm     TR Peak grad:   7.4 mmHg MV Decel Time: 243 msec     TR Vmax:        136.00 cm/s MV E velocity: 51.80 cm/s MV A velocity: 104.00 cm/s  SHUNTS MV E/A ratio:  0.50         Systemic VTI:  0.19 m                             Systemic Diam: 2.40 cm Skeet Latch MD Electronically signed by Skeet Latch MD Signature Date/Time: 10/10/2019/4:28:26 PM    Final     Review of Systems  Constitutional: Positive for activity change and fatigue.  HENT: Negative.   Eyes: Negative.   Respiratory: Positive for chest tightness and shortness of breath.   Cardiovascular: Positive for chest pain. Negative for leg swelling.  Gastrointestinal: Positive for abdominal pain.  Endocrine: Negative.   Genitourinary: Negative.   Musculoskeletal: Negative.   Allergic/Immunologic: Negative.   Neurological: Negative for dizziness and syncope.  Hematological: Negative.     Psychiatric/Behavioral: Negative.    Blood pressure (!) 160/74, pulse 65, temperature 97.6 F (36.4 C), temperature source Oral, resp. rate 18, height 5\' 8"  (1.727 m), weight 106.6 kg, SpO2 95 %. Physical Exam  Constitutional: He is oriented to person, place, and time. He appears well-developed and well-nourished.  HENT:  Head: Normocephalic and atraumatic.  Eyes: Pupils are equal, round, and reactive to light. EOM are normal.  Neck: No JVD present.  Cardiovascular: Normal rate, regular rhythm, normal heart sounds and intact distal pulses.  No murmur heard. Respiratory: Effort normal and breath sounds normal. He has no rales.  GI: Soft. There is no abdominal tenderness.  obese  Musculoskeletal:        General: No edema.  Neurological: He is alert and oriented to person, place, and time. He has normal strength.  Skin: Skin is warm and dry.  Psychiatric: He has a normal mood and affect. His behavior is normal. Judgment and thought content normal.  ECHOCARDIOGRAM REPORT       Patient Name:  NERY DASTRUP Date of Exam: 10/10/2019  Medical Rec #: [id number]     Height:    68.0 in  Accession #:  [id number]     Weight:    235.1 lb  Date of Birth: 1935/12/01     BSA:     2.189 m  Patient Age:  69 years      BP:      166/74 mmHg  Patient Gender: M         HR:      62 bpm.  Exam Location: Inpatient   Procedure: 2D Echo   Indications:  NSTEMI I21.4    History:    Patient has prior history of Echocardiogram examinations,  most         recent 03/19/2018. Previous Myocardial Infarction; Risk         Factors:Hypertension and Dyslipidemia.    Sonographer:  Mikki Santee RDCS (AE)  Referring Phys: [id number] [place]    1. Left ventricular ejection fraction, by estimation, is 45 to 50%. The  left ventricle has mildly decreased function. The left ventricle   demonstrates global hypokinesis. There is mild concentric left ventricular  hypertrophy. Left ventricular diastolic  parameters are consistent with Grade I diastolic dysfunction (impaired  relaxation).  2. Right ventricular systolic function is normal. The right ventricular  size is normal. There is normal pulmonary artery systolic pressure.  3. The mitral valve is normal in structure. Trivial mitral valve  regurgitation. No evidence of mitral stenosis.  4. The aortic valve is normal in structure. Aortic valve regurgitation is  not visualized. No aortic stenosis is present.  5. Aortic dilatation noted. There is mild dilatation at the level of the  sinuses of Valsalva measuring 37 mm.  6. The inferior vena cava is normal in size with <50% respiratory  variability, suggesting right atrial pressure of 8 mmHg.   FINDINGS  Left Ventricle: Left ventricular ejection fraction, by estimation, is 45  to 50%. The left ventricle has mildly decreased function. The left  ventricle demonstrates global hypokinesis. The left ventricular internal  cavity size was normal in size. There is  mild concentric left ventricular hypertrophy. Left ventricular diastolic  parameters are consistent with Grade I diastolic dysfunction (impaired  relaxation). Indeterminate filling pressures.   Right Ventricle: The right ventricular size is normal. No increase in  right ventricular wall thickness. Right ventricular systolic function is  normal. There is normal pulmonary artery systolic pressure. The tricuspid  regurgitant velocity is 1.36 m/s, and  with an assumed right atrial pressure of 8 mmHg, the estimated right  ventricular systolic pressure is 123XX123 mmHg.   Left Atrium: Left atrial size was normal in size.   Right Atrium: Right atrial size was normal in size.   Pericardium: There is no evidence of pericardial effusion.   Mitral Valve: The mitral valve is normal in structure. Normal mobility of   the mitral valve leaflets. Trivial mitral valve regurgitation. No evidence  of mitral valve stenosis.   Tricuspid Valve: The tricuspid valve is normal in structure. Tricuspid  valve regurgitation is trivial. No evidence of tricuspid stenosis.   Aortic Valve: The aortic valve is normal in structure. Aortic valve  regurgitation is not visualized. No aortic stenosis is present.   Pulmonic Valve: The pulmonic valve was normal in structure. Pulmonic valve  regurgitation is not visualized. No evidence of pulmonic stenosis.   Aorta: Aortic  dilatation noted. There is mild dilatation at the level of  the sinuses of Valsalva measuring 37 mm.   Venous: The inferior vena cava is normal in size with less than 50%  respiratory variability, suggesting right atrial pressure of 8 mmHg.   IAS/Shunts: No atrial level shunt detected by color flow Doppler.     LEFT VENTRICLE  PLAX 2D  LVIDd:     4.40 cm Diastology  LVIDs:     3.20 cm LV e' lateral:  4.24 cm/s  LV PW:     1.10 cm LV E/e' lateral: 12.2  LV IVS:    1.20 cm LV e' medial:  4.57 cm/s  LVOT diam:   2.40 cm LV E/e' medial: 11.3  LV SV:     87  LV SV Index:  40  LVOT Area:   4.52 cm     RIGHT VENTRICLE  RV S prime:   14.60 cm/s  TAPSE (M-mode): 2.2 cm   LEFT ATRIUM       Index    RIGHT ATRIUM      Index  LA diam:    3.30 cm 1.51 cm/m RA Area:   13.90 cm  LA Vol (A2C):  57.3 ml 26.17 ml/m RA Volume:  27.90 ml 12.74 ml/m  LA Vol (A4C):  36.5 ml 16.67 ml/m  LA Biplane Vol: 45.6 ml 20.83 ml/m  AORTIC VALVE  LVOT Vmax:  85.50 cm/s  LVOT Vmean: 47.000 cm/s  LVOT VTI:  0.193 m    AORTA  Ao Root diam: 3.70 cm   MITRAL VALVE        TRICUSPID VALVE  MV Area (PHT): 3.12 cm   TR Peak grad:  7.4 mmHg  MV Decel Time: 243 msec   TR Vmax:    136.00 cm/s  MV E velocity: 51.80 cm/s  MV A velocity: 104.00 cm/s SHUNTS  MV E/A ratio: 0.50      Systemic VTI: 0.19 m               Systemic Diam: 2.40 cm   Skeet Latch MD  Electronically signed by Skeet Latch MD  Signature Date/Time: 10/10/2019/4:28:26 PM     Physicians Panel Physicians Referring Physician Case Authorizing Physician  End, Harrell Gave, MD (Primary)       Procedures INTRAVASCULAR PRESSURE WIRE/FFR STUDY  LEFT HEART CATH AND CORONARY ANGIOGRAPHY     Conclusion Conclusions:  1. Severe single vessel coronary artery disease with 90% ostial/proximal LAD disease followed by 75% stenosis at the bifurcation with previously stented D1 ([place] 0,1,0). 2. Mild to moderate, non-obstructive disease involving ramus intermedius and proximal RCA (DFR 0.94). 3. Previously placed stents in D1, LCx, proximal RCA, and mid RCA are patent, with mild in-stent restenosis noted in the LCx and mid RCA. 4. Mildly to moderately reduced left ventricular contraction (LVEF ~45%) with normal filling pressure. Recommendations:  1. Images were reviewed with Dr. Gwenlyn Found. We have agreed that surgical revascularization may be preferential for treatment of complex LAD disease. PCI to ostial and proximal LAD is feasible but could jeopardize ramus intermedius/LCx and D1. 2. Discontinue clopidogrel pending cardiac surgery consultation. Heparin infusion to be restarted 2 hours after TR band deflation. 3. Aggressive secondary prevention of coronary artery disease. 4. Follow-up echocardiogram interpretation. Nelva Bush, MD  [facility]      Recommendations Antiplatelet/Anticoag Recommend dual antiplatelet therapy with Clopidogrel 75mg  daily. Aspirin deferred given history of severe allergy. Clopidogrel will be held pending cardiac surgery consultation. Heparin infusion to be restarted  2 hours after TR band deflation.   Discharge Date Anticipated discharge date to be determined.        Indications Non-ST elevation (NSTEMI) myocardial infarction (HCC) [I21.4  (ICD-10-CM)]     Procedural Details Technical Details Indication: 84 y.o. 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  GFR: 58 ml/min  Procedure: The risks, benefits, complications, treatment options, and expected outcomes were discussed with the patient. The patient and/or family concurred with the proposed plan, giving informed consent. The patient was sedated with IV midazolam and fentanyl. The right wrist was assessed with a modified Allens test which was normal. The right wrist was prepped and draped in a sterile fashion. 1% lidocaine was used for local anesthesia. Using the modified Seldinger access technique, a 76F slender Glidesheath was placed in the right radial artery. 3 mg Verapamil was given through the sheath. Heparin 5,000 units were administered.  Selective coronary angiography was performed using 40F JL3.5 and JR4 catheters to engage the left and right coronary arteries, respectively. Left heart catheterization was performed using a 40F JR4 catheter. Left ventriculogram was performed with a hand injection of contrast.  DFR of RCA: Heparin was used for anticoagulation. A 40F JR4 guide catheter was advanced into the aortic root and a Comet pressure wire equalized in the root. The RCA was then engaged and the Comet wire advanced into the rPDA. Intracoronary NTG was administered and the guide catheter disengaged from the ostium of the RCA. DFR was then measured (DFR = 0.94). Final angiogram demonstrates stable appearance of the right coronary artery.  At the end of the procedure, the radial artery sheath was removed and a TR band applied to achieve patent hemostasis. There were no immediate complications. The patient was taken to the recovery area in stable condition.  Estimated blood loss <50 mL.   During this  procedure medications were administered to achieve and maintain moderate conscious sedation while the patient's heart rate, blood pressure, and oxygen saturation were continuously monitored and I was present face-to-face 100% of this time.     Medications (Filter: Administrations occurring from 10/10/19 1515 to 10/10/19 1615)  Continuous medications are totaled by the amount administered until 10/10/19 1615.  Heparin (Porcine) in NaCl 1000-0.9 UT/500ML-% SOLN (mL) Total volume: 1,500 mL  Date/Time   Rate/Dose/Volume Action  10/10/19 1525  500 mL Given  1525  500 mL Given  1525  500 mL Given  fentaNYL (SUBLIMAZE) injection (mcg) Total dose: 25 mcg  Date/Time   Rate/Dose/Volume Action  10/10/19 1526  25 mcg Given  midazolam (VERSED) injection (mg) Total dose: 1 mg  Date/Time   Rate/Dose/Volume Action  10/10/19 1527  1 mg Given  lidocaine (PF) (XYLOCAINE) 1 % injection (mL) Total volume: 2 mL  Date/Time   Rate/Dose/Volume Action  10/10/19 1530  2 mL Given  Radial Cocktail/Verapamil only (mL) Total volume: 8 mL  Date/Time   Rate/Dose/Volume Action  10/10/19 1531  8 mL Given  heparin injection (Units) Total dose: 10,000 Units  Date/Time   Rate/Dose/Volume Action  10/10/19 1532  5,000 Units Given  1550  5,000 Units Given  nitroGLYCERIN 1 mg/10 mL (100 mcg/mL) - IR/CATH LAB (mcg) Total dose: 200 mcg  Date/Time   Rate/Dose/Volume Action  10/10/19 1602  200 mcg Given  iohexol (OMNIPAQUE) 350 MG/ML injection (mL) Total volume: 80  mL  Date/Time   Rate/Dose/Volume Action  10/10/19 1605  80 mL Given  0.9 % sodium chloride infusion (mL) Total dose: Cannot be calculated* Dosing weight: 107.1  *Administration dose not documented  Date/Time   Rate/Dose/Volume Action  10/10/19 1522  *Not included in total MAR Hold  acetaminophen (TYLENOL) tablet 650 mg (mg) Total dose: Cannot be calculated* Dosing weight: 107.1  *Administration dose not documented  Date/Time    Rate/Dose/Volume Action  10/10/19 1522  *Not included in total MAR Hold  amLODipine (NORVASC) tablet 10 mg (mg) Total dose: Cannot be calculated*  *Administration dose not documented  Date/Time   Rate/Dose/Volume Action  10/10/19 1522  *Not included in total MAR Hold  atorvastatin (LIPITOR) tablet 80 mg (mg) Total dose: Cannot be calculated*  *Administration dose not documented  Date/Time   Rate/Dose/Volume Action  10/10/19 1522  *Not included in total MAR Hold  carvedilol (COREG) tablet 12.5 mg (mg) Total dose: Cannot be calculated*  *Administration dose not documented  Date/Time   Rate/Dose/Volume Action  10/10/19 1522  *Not included in total MAR Hold  isosorbide mononitrate (IMDUR) 24 hr tablet 30 mg (mg) Total dose: Cannot be calculated*  *Administration dose not documented  Date/Time   Rate/Dose/Volume Action  10/10/19 1522  *Not included in total MAR Hold  nitroGLYCERIN (NITROSTAT) SL tablet 0.4 mg (mg) Total dose: Cannot be calculated* Dosing weight: 109.3  *Administration dose not documented  Date/Time   Rate/Dose/Volume Action  10/10/19 1522  *Not included in total MAR Hold  ondansetron (ZOFRAN) injection 4 mg (mg) Total dose: Cannot be calculated* Dosing weight: 107.1  *Administration dose not documented  Date/Time   Rate/Dose/Volume Action  10/10/19 1522  *Not included in total MAR Hold  sodium chloride flush (NS) 0.9 % injection 3 mL (mL) Total dose: Cannot be calculated* Dosing weight: 107.1  *Administration dose not documented  Date/Time   Rate/Dose/Volume Action  10/10/19 1522  *Not included in total MAR Hold  sodium chloride flush (NS) 0.9 % injection 3 mL (mL) Total dose: Cannot be calculated* Dosing weight: 107.1  *Administration dose not documented  Date/Time   Rate/Dose/Volume Action  10/10/19 1522  *Not included in total MAR Hold  sodium chloride flush (NS) 0.9 % injection 3 mL (mL) Total dose: Cannot be calculated* Dosing weight: 107.1   *Administration dose not documented  Date/Time   Rate/Dose/Volume Action  10/10/19 1522  *Not included in total MAR Hold  clopidogrel (PLAVIX) tablet 75 mg (mg) Total dose: Cannot be calculated*  *Administration dose not documented  Date/Time   Rate/Dose/Volume Action  10/10/19 1522  *Not included in total MAR Hold     Sedation Time Sedation Time Physician-1: 38 minutes 5 seconds        Contrast Medication Name Total Dose  iohexol (OMNIPAQUE) 350 MG/ML injection 80 mL     Radiation/Fluoro Fluoro time: 5.4 (min)  DAP: [id number] (mGycm2)  Cumulative Air Kerma: [id number] (mGy)     Complications Complications documented before study signed (10/10/2019 [id number] PM)  No complications were associated with this study.  Documented by Nelva Bush, MD - 10/10/2019 4:26 PM     Coronary Findings Diagnostic Dominance: Right  Left Main  Vessel is large. Vessel is angiographically normal.   Left Anterior Descending  Vessel is large.  Ost LAD to Prox LAD lesion 90% stenosed  Ost LAD to Prox LAD lesion is 90% stenosed.  Prox LAD to Mid LAD lesion 75% stenosed  Prox LAD to Mid LAD lesion is 75% stenosed.  First Diagonal Branch  Vessel is moderate in size.  1st Diag lesion 0% stenosed  Previously placed 1st Diag stent (unknown type) is widely patent.   Second Diagonal Branch  Vessel is small in size.   Third Diagonal Branch  Vessel is small in size.   Ramus Intermedius  Vessel is moderate in size.  Ramus lesion 30% stenosed  Ramus lesion is 30% stenosed.   Left Circumflex  Vessel is large.  Prox Cx lesion 15% stenosed  Prox Cx lesion is 15% stenosed. The lesion was previously treated using a drug eluting stent over 2 years ago.   First Obtuse Marginal Branch  Vessel is small in size.   Second Obtuse Marginal Branch  Vessel is moderate in size.   Third Obtuse Marginal Branch  Vessel is small in size.   Right Coronary Artery  Vessel is large.  Ost RCA to Prox RCA  lesion 50% stenosed  Ost RCA to Prox RCA lesion is 50% stenosed. Pressure wire/FFR was performed on the lesion. Pressure dampening noted with 75F diagnostic catheter. DFR = 0.94.  Prox RCA lesion 0% stenosed  Previously placed Prox RCA stent (unknown type) is widely patent.  Mid RCA lesion 15% stenosed  Mid RCA lesion is 15% stenosed. The lesion was previously treated using a drug eluting stent over 2 years ago.  Intervention No interventions have been documented.                      Wall Motion Resting               All segments of the heart are hypokinetic.               Left Heart Left Ventricle There is mild to moderate left ventricular systolic dysfunction. LVEF ~45%.   Aortic Valve There is no aortic valve stenosis.     Coronary Diagrams Diagnostic Dominance: Right  &&&&&    Assessment/Plan:  This 84 year old gentleman has severe single-vessel coronary disease with high-grade ostial and proximal LAD stenosis as well as a 75% stenosis at the bifurcation of a large diagonal branch.  He presents with unstable anginal symptoms and ruled in for non-ST segment elevation MI.  This lesion is not recommended due to the anatomy and I agree that coronary bypass graft surgery to the LAD and diagonal branch is the best treatment for this gentleman.  He has been on Plavix which is now stopped.  I will tentatively plan on doing surgery on Friday and will check a P2 Y 12 level on Thursday.  I discussed the operative procedure with the patient and family including alternatives, benefits and risks; including but not limited to bleeding, blood transfusion, infection, stroke, myocardial infarction, graft failure, heart block requiring a permanent pacemaker, organ dysfunction, and death.  Eddie Dibbles Koper understands and agrees to proceed.    Gaye Pollack 10/10/2019, 5:56 PM

## 2019-10-10 NOTE — Progress Notes (Signed)
[place] for Heparin Indication: chest pain/ACS  Allergies  Allergen Reactions  . Aspirin Anaphylaxis  . Other     Foods that are high in acid content cause sores and a rash.   . Penicillins Nausea And Vomiting    Patient Measurements: Height: 5\' 8"  (172.7 cm) Weight: 106.6 kg (235 lb 1.6 oz) IBW/kg (Calculated) : 68.4 Heparin Dosing Weight: 90 kg  Vital Signs: Temp: 98 F (36.7 C) (04/12 0802) Temp Source: Oral (04/12 0802) BP: 156/78 (04/12 0907) Pulse Rate: 64 (04/12 0907)  Labs: Recent Labs    10/09/19 0242 10/09/19 0252 10/09/19 0252 10/09/19 0432 10/09/19 0821 10/09/19 1200 10/09/19 1517 10/09/19 2224 10/10/19 0511  HGB  --  12.4*  --   --   --   --   --   --  11.7*  HCT  --  39.1  --   --   --   --   --   --  36.4*  PLT  --  217  --   --   --   --   --   --  213  APTT 33  --   --   --   --   --   --   --   --   LABPROT 12.8  --   --   --   --   --  14.3  --   --   INR 1.0  --   --   --   --   --  1.1  --   --   HEPARINUNFRC  --   --   --   --   --   --  1.07* 0.69 0.96*  CREATININE  --  1.37*  --   --   --   --   --   --  1.33*  TROPONINIHS  --  13   < > 132* 499* 705*  --   --   --    < > = values in this interval not displayed.    Estimated Creatinine Clearance: 51.6 mL/min (A) (by C-G formula based on SCr of 1.33 mg/dL (H)).   Medical History: Past Medical History:  Diagnosis Date  . Anaphylaxis    Aspirin  . Coronary atherosclerosis of native coronary artery    a. BMS to RCA and LCx 10/09, DES to D1 and RCA 04/2014  . Essential hypertension   . Hyperlipidemia   . Myocardial infarction ([place]) 1998  . NSTEMI (non-ST elevated myocardial infarction) ([place]) 03/2008  . Seasonal allergies     Medications:  No current facility-administered medications on file prior to encounter.   Current Outpatient Medications on File Prior to Encounter  Medication Sig Dispense Refill  . amLODipine (NORVASC) 5 MG tablet  Take 1 tablet (5 mg total) by mouth daily. 90 tablet 3  . Ascorbic Acid (VITAMIN C) 1000 MG tablet Take 1,000 mg by mouth daily.      [name] atorvastatin (LIPITOR) 80 MG tablet Take 1 tablet daily at 6 pm. 90 tablet 3  . carvedilol (COREG) 12.5 MG tablet TAKE 1 TABLET BY MOUTH TWICE DAILY --  NEEDS  FOLLOW  UP  APPOINTMENT  FOR  FURTHER  REFILLS 180 tablet 3  . clopidogrel (PLAVIX) 75 MG tablet TAKE 1 TABLET BY MOUTH ONCE DAILY - NEED FOLLOW UP FOR REFILLS 90 tablet 3  . isosorbide mononitrate (IMDUR) 30 MG 24 hr tablet Take 1 tablet by mouth once daily  90 tablet 2  . lisinopril (ZESTRIL) 20 MG tablet TAKE 1 TABLET BY MOUTH TWICE DAILY . APPOINTMENT REQUIRED FOR FUTURE REFILLS 180 tablet 3  . nitroGLYCERIN (NITROSTAT) 0.4 MG SL tablet Place 1 tablet (0.4 mg total) under the tongue every 5 (five) minutes as needed. 25 tablet 0  . Omega-3 Fatty Acids (FISH OIL) 1000 MG CAPS Take 1,000 mg by mouth 2 (two) times daily.    . vitamin E 1000 UNIT capsule Take 1,000 Units by mouth daily.         Assessment: 84 y.o. male with chest pain on heparin for NSTEMI. Plans are for cath today -heparin level = 0.96 -hg= 11.7    Goal of Therapy:  Heparin level 0.3-0.7 units/ml Monitor platelets by anticoagulation protocol: Yes   Plan:  Decrease heparin to 950 units/hr Will follow plans post cath  Hildred Laser, PharmD Clinical Pharmacist **Pharmacist phone directory can now be found on [email or web address] ([email or web address]).  Listed under [place].

## 2019-10-10 NOTE — Progress Notes (Signed)
  Echocardiogram 2D Echocardiogram has been performed.  Jennette Dubin 10/10/2019, 3:25 PM

## 2019-10-10 NOTE — Progress Notes (Signed)
TR band off at 1920, level 0, no complications at this point. Informed pharmacist Maudie Mercury of TR band being removed and time, so that Heparin can be adjusted.

## 2019-10-10 NOTE — Progress Notes (Addendum)
[facility] for Heparin Indication: chest pain/ACS  Allergies  Allergen Reactions  . Aspirin Anaphylaxis  . Other     Foods that are high in acid content cause sores and a rash.   . Penicillins Nausea And Vomiting    Patient Measurements: Height: 5\' 8"  (172.7 cm) Weight: 106.6 kg (235 lb 1.6 oz) IBW/kg (Calculated) : 68.4 Heparin Dosing Weight: 90 kg  Vital Signs: Temp: 97.6 F (36.4 C) (04/12 1158) Temp Source: Oral (04/12 1158) BP: 160/74 (04/12 1633) Pulse Rate: 65 (04/12 1633)  Labs: Recent Labs    10/09/19 0242 10/09/19 0252 10/09/19 0252 10/09/19 0432 10/09/19 0821 10/09/19 1200 10/09/19 1517 10/09/19 2224 10/10/19 0511  HGB  --  12.4*  --   --   --   --   --   --  11.7*  HCT  --  39.1  --   --   --   --   --   --  36.4*  PLT  --  217  --   --   --   --   --   --  213  APTT 33  --   --   --   --   --   --   --   --   LABPROT 12.8  --   --   --   --   --  14.3  --   --   INR 1.0  --   --   --   --   --  1.1  --   --   HEPARINUNFRC  --   --   --   --   --   --  1.07* 0.69 0.96*  CREATININE  --  1.37*  --   --   --   --   --   --  1.33*  TROPONINIHS  --  13   < > 132* 499* 705*  --   --   --    < > = values in this interval not displayed.    Estimated Creatinine Clearance: 51.6 mL/min (A) (by C-G formula based on SCr of 1.33 mg/dL (H)).   Medical History: Past Medical History:  Diagnosis Date  . Anaphylaxis    Aspirin  . Coronary atherosclerosis of native coronary artery    a. BMS to RCA and LCx 10/09, DES to D1 and RCA 04/2014  . Essential hypertension   . Hyperlipidemia   . Myocardial infarction ([place]) 1998  . NSTEMI (non-ST elevated myocardial infarction) ([place]) 03/2008  . Seasonal allergies     Assessment: 84 y.o. male with chest pain on heparin for NSTEMI. Plans are for cath today  Heparin level earlier today on heparin infusion of 1100 units/hr was 0.96 units/ml, which is above the goal range for this pt.  Heparin infusion was decreased to 950 units/hr ~0800 today, and heparin infusion was stopped prior to cardiac cath today. H/H 11.7/36.4, platelets 213 today.  Cath findings: severe single-vessel CAD, with 90% ostial/proximal LAD disease, followed by 75% stenosis at bifurcation with previously stented D1; LVEF ~45% with normal filling pressure. Pharmacy is consulted to restart heparin 2 hrs after TR band deflation, pending consulting with cardiac surgery. Per RN, TR band still inflated; she will notify pharmacy when TR band is fully deflated so that we can schedule the heparin infusion to restart ~2 hrs later. RN stated that pt has no bleeding post cath.   Goal of Therapy:  Heparin level  0.3-0.7 units/ml Monitor platelets by anticoagulation protocol: Yes   Plan:  Restart heparin infusion at 950 units/hr 2 hrs after TR band deflation post cath Check heparin level 8 hrs after restarting heparin infusion Monitor daily heparin level, CBC Monitor for signs/symptoms of bleeding  Gillermina Hu, PharmD, BCPS, [facility] Clinical Pharmacist 10/10/19, 16: 38 PM  ADDENDUM Per RN, TR band removed at 19:20 PM tonight; restart heparin infusion at 950 units/hr ~2 hrs after TR band deflated; check heparin level 8 hrs after restarting heparin infusion) Per CT Surgery note, plan for CABG on Friday

## 2019-10-10 NOTE — Interval H&P Note (Signed)
History and Physical Interval Note:  10/10/2019 3:22 PM  Corey [name]  has presented today for surgery, with the diagnosis of NSTEMI.  The various methods of treatment have been discussed with the patient and family. After consideration of risks, benefits and other options for treatment, the patient has consented to  Procedure(s): LEFT HEART CATH AND CORONARY ANGIOGRAPHY (N/A) as a surgical intervention.  The patient's history has been reviewed, patient examined, no change in status, stable for surgery.  I have reviewed the patient's chart and labs.  Questions were answered to the patient's satisfaction.    Cath Lab Visit (complete for each Cath Lab visit)  Clinical Evaluation Leading to the Procedure:   ACS: Yes.    Non-ACS:  N/A  Ej Pinson

## 2019-10-10 NOTE — Plan of Care (Signed)

## 2019-10-11 ENCOUNTER — Inpatient Hospital Stay (HOSPITAL_COMMUNITY): Payer: Medicare HMO

## 2019-10-11 ENCOUNTER — Ambulatory Visit: Payer: Medicare HMO

## 2019-10-11 DIAGNOSIS — Z01810 Encounter for preprocedural cardiovascular examination: Secondary | ICD-10-CM

## 2019-10-11 LAB — HEPARIN LEVEL (UNFRACTIONATED): Heparin Unfractionated: 0.55 IU/mL (ref 0.30–0.70)

## 2019-10-11 LAB — BASIC METABOLIC PANEL
Anion gap: 10 (ref 5–15)
BUN: 15 mg/dL (ref 8–23)
CO2: 21 mmol/L — ABNORMAL LOW (ref 22–32)
Calcium: 8.8 mg/dL — ABNORMAL LOW (ref 8.9–10.3)
Chloride: 110 mmol/L (ref 98–111)
Creatinine, Ser: 1.3 mg/dL — ABNORMAL HIGH (ref 0.61–1.24)
GFR calc Af Amer: 59 mL/min — ABNORMAL LOW (ref >60)
GFR calc non Af Amer: 51 mL/min — ABNORMAL LOW (ref >60)
Glucose, Bld: 101 mg/dL — ABNORMAL HIGH (ref 70–99)
Potassium: 4.2 mmol/L (ref 3.5–5.1)
Sodium: 141 mmol/L (ref 135–145)

## 2019-10-11 LAB — CBC
HCT: 34.7 % — ABNORMAL LOW (ref 39.0–52.0)
Hemoglobin: 11 g/dL — ABNORMAL LOW (ref 13.0–17.0)
MCH: 26.4 pg (ref 26.0–34.0)
MCHC: 31.7 g/dL (ref 30.0–36.0)
MCV: 83.4 fL (ref 80.0–100.0)
Platelets: 195 10*3/uL (ref 150–400)
RBC: 4.16 MIL/uL — ABNORMAL LOW (ref 4.22–5.81)
RDW: 17.3 % — ABNORMAL HIGH (ref 11.5–15.5)
WBC: 7.1 10*3/uL (ref 4.0–10.5)
nRBC: 0 % (ref 0.0–0.2)

## 2019-10-11 MED ORDER — CARVEDILOL 25 MG PO TABS
25.0000 mg | ORAL_TABLET | Freq: Two times a day (BID) | ORAL | Status: DC
  Administered 2019-10-11 – 2019-10-13 (×5): 25 mg via ORAL
  Filled 2019-10-11 (×5): qty 1

## 2019-10-11 MED ORDER — CARVEDILOL 12.5 MG PO TABS
12.5000 mg | ORAL_TABLET | Freq: Once | ORAL | Status: AC
  Administered 2019-10-11: 12.5 mg via ORAL
  Filled 2019-10-11: qty 1

## 2019-10-11 NOTE — Progress Notes (Signed)
[facility] for Heparin Indication: chest pain/ACS  Allergies  Allergen Reactions  . Aspirin Anaphylaxis  . Other     Foods that are high in acid content cause sores and a rash.   . Penicillins Nausea And Vomiting    Patient Measurements: Height: 5\' 8"  (172.7 cm) Weight: 107 kg (235 lb 14.4 oz) IBW/kg (Calculated) : 68.4 Heparin Dosing Weight: 90 kg  Vital Signs: Temp: 97.9 F (36.6 C) (04/13 0749) Temp Source: Oral (04/13 0749) BP: 162/79 (04/13 0900) Pulse Rate: 65 (04/13 0749)  Labs: Recent Labs    10/09/19 0242 10/09/19 0252 10/09/19 0252 10/09/19 0432 10/09/19 0821 10/09/19 1200 10/09/19 1517 10/09/19 1517 10/09/19 2224 10/10/19 0511 10/11/19 0640  HGB  --  12.4*   < >  --   --   --   --   --   --  11.7* 11.0*  HCT  --  39.1  --   --   --   --   --   --   --  36.4* 34.7*  PLT  --  217  --   --   --   --   --   --   --  213 195  APTT 33  --   --   --   --   --   --   --   --   --   --   LABPROT 12.8  --   --   --   --   --  14.3  --   --   --   --   INR 1.0  --   --   --   --   --  1.1  --   --   --   --   HEPARINUNFRC  --   --   --   --   --   --  1.07*   < > 0.69 0.96* 0.55  CREATININE  --  1.37*  --   --   --   --   --   --   --  1.33* 1.30*  TROPONINIHS  --  13   < > 132* 499* 705*  --   --   --   --   --    < > = values in this interval not displayed.    Estimated Creatinine Clearance: 52.8 mL/min (A) (by C-G formula based on SCr of 1.3 mg/dL (H)).   Medical History: Past Medical History:  Diagnosis Date  . Anaphylaxis    Aspirin  . Coronary atherosclerosis of native coronary artery    a. BMS to RCA and LCx 10/09, DES to D1 and RCA 04/2014  . Essential hypertension   . Hyperlipidemia   . Myocardial infarction ([place]) 1998  . NSTEMI (non-ST elevated myocardial infarction) ([place]) 03/2008  . Seasonal allergies     Assessment: 84 y.o. male with chest pain on heparin for NSTEMI. He is s/p cath with plans for  CABG on Friday -heparin level at goal -hg= 11   Goal of Therapy:  Heparin level 0.3-0.7 units/ml Monitor platelets by anticoagulation protocol: Yes   Plan:  -no heparin changes needed -daily heparin level and CBC  Hildred Laser, PharmD Clinical Pharmacist **Pharmacist phone directory can now be found on [email or web address] ([email or web address]).  Listed under [place].

## 2019-10-11 NOTE — Progress Notes (Signed)
Pre cabg has been completed.   Preliminary results in CV Proc.   Abram Sander 10/11/2019 10:08 AM

## 2019-10-11 NOTE — Progress Notes (Addendum)
Progress Note  Patient Name: Corey [name] Date of Encounter: 10/11/2019  Primary Cardiologist: Rozann Lesches, MD   Subjective   Feeling well this morning. No chest pain.   Inpatient Medications    Scheduled Meds: . amLODipine  10 mg Oral Daily  . atorvastatin  80 mg Oral q1800  . carvedilol  12.5 mg Oral BID WC  . isosorbide mononitrate  30 mg Oral Daily  . sodium chloride flush  3 mL Intravenous Q12H  . sodium chloride flush  3 mL Intravenous Q12H  . sodium chloride flush  3 mL Intravenous Q12H   Continuous Infusions: . sodium chloride    . sodium chloride    . heparin 950 Units/hr (10/11/19 0720)   PRN Meds: sodium chloride, sodium chloride, acetaminophen, nitroGLYCERIN, ondansetron (ZOFRAN) IV, sodium chloride flush, sodium chloride flush   Vital Signs    Vitals:   10/10/19 1958 10/11/19 0141 10/11/19 0509 10/11/19 0749  BP: (!) 154/64 (!) 150/65  (!) 190/73  Pulse: 71 72  65  Resp: 20 20  18   Temp: 97.9 F (36.6 C) 98.7 F (37.1 C)  97.9 F (36.6 C)  TempSrc: Oral Oral  Oral  SpO2: 98% 98%  97%  Weight:   107 kg   Height:        Intake/Output Summary (Last 24 hours) at 10/11/2019 0848 Last data filed at 10/11/2019 0835 Gross per 24 hour  Intake 1128.17 ml  Output 1950 ml  Net -821.83 ml   Last 3 Weights 10/11/2019 10/10/2019 10/09/2019  Weight (lbs) 235 lb 14.4 oz 235 lb 1.6 oz 236 lb 1 oz  Weight (kg) 107.004 kg 106.641 kg 107.077 kg      Telemetry    SR, short run of SVT - Personally Reviewed  ECG    SR with LBBB, nonspecific TW changes - Personally Reviewed  Physical Exam  Pleasant older AAM, laying in bed.  GEN: No acute distress.   Neck: No JVD Cardiac: RRR, no murmurs, rubs, or gallops.  Respiratory: Clear to auscultation bilaterally. GI: Soft, nontender, non-distended  MS: No edema; No deformity. Right radial cath site stable.  Neuro:  Nonfocal  Psych: Normal affect   Labs    High Sensitivity Troponin:   Recent Labs    Lab 10/09/19 0252 10/09/19 0432 10/09/19 0821 10/09/19 1200  TROPONINIHS 13 132* 499* 705*      Chemistry Recent Labs  Lab 10/09/19 0252 10/10/19 0511 10/11/19 0640  NA 140 142 141  K 3.8 3.9 4.2  CL 108 108 110  CO2 24 24 21*  GLUCOSE 124* 103* 101*  BUN 17 13 15   CREATININE 1.37* 1.33* 1.30*  CALCIUM 9.3 9.1 8.8*  PROT 7.6 6.5  --   ALBUMIN 4.0 3.4*  --   AST 23 24  --   ALT 22 19  --   ALKPHOS 39 35*  --   BILITOT 0.5 0.9  --   GFRNONAA 48* 50* 51*  GFRAA 56* 58* 59*  ANIONGAP 8 10 10      Hematology Recent Labs  Lab 10/09/19 0252 10/10/19 0511 10/11/19 0640  WBC 5.7 7.5 7.1  RBC 4.63 4.38 4.16*  HGB 12.4* 11.7* 11.0*  HCT 39.1 36.4* 34.7*  MCV 84.4 83.1 83.4  MCH 26.8 26.7 26.4  MCHC 31.7 32.1 31.7  RDW 17.3* 17.2* 17.3*  PLT 217 213 195    BNPNo results for input(s): BNP, PROBNP in the last 168 hours.   DDimer No results for input(s):  DDIMER in the last 168 hours.   Radiology    CARDIAC CATHETERIZATION  Result Date: 10/10/2019 Conclusions: 1. Severe single vessel coronary artery disease with 90% ostial/proximal LAD disease followed by 75% stenosis at the bifurcation with previously stented D1 ([place] 0,1,0). 2. Mild to moderate, non-obstructive disease involving ramus intermedius and proximal RCA (DFR 0.94). 3. Previously placed stents in D1, LCx, proximal RCA, and mid RCA are patent, with mild in-stent restenosis noted in the LCx and mid RCA. 4. Mildly to moderately reduced left ventricular contraction (LVEF ~45%) with normal filling pressure. Recommendations: 1. Images were reviewed with Dr. Gwenlyn Found.  We have agreed that surgical revascularization may be preferential for treatment of complex LAD disease.  PCI to ostial and proximal LAD is feasible but could jeopardize ramus intermedius/LCx and D1. 2. Discontinue clopidogrel pending cardiac surgery consultation.  Heparin infusion to be restarted 2 hours after TR band deflation. 3. Aggressive secondary  prevention of coronary artery disease. 4. Follow-up echocardiogram interpretation. Nelva Bush, MD [facility]   ECHOCARDIOGRAM COMPLETE  Result Date: 10/10/2019    ECHOCARDIOGRAM REPORT   Patient Name:   Corey [name] Date of Exam: 10/10/2019 Medical Rec #:  [id number]          Height:       68.0 in Accession #:    [id number]         Weight:       235.1 lb Date of Birth:  Oct 28, 1935         BSA:          2.189 m Patient Age:    84 years           BP:           166/74 mmHg Patient Gender: M                  HR:           62 bpm. Exam Location:  Inpatient Procedure: 2D Echo Indications:    NSTEMI I21.4  History:        Patient has prior history of Echocardiogram examinations, most                 recent 03/19/2018. Previous Myocardial Infarction; Risk                 Factors:Hypertension and Dyslipidemia.  Sonographer:    Mikki Santee RDCS (AE) Referring Phys: [id number] [place]  1. Left ventricular ejection fraction, by estimation, is 45 to 50%. The left ventricle has mildly decreased function. The left ventricle demonstrates global hypokinesis. There is mild concentric left ventricular hypertrophy. Left ventricular diastolic parameters are consistent with Grade I diastolic dysfunction (impaired relaxation).  2. Right ventricular systolic function is normal. The right ventricular size is normal. There is normal pulmonary artery systolic pressure.  3. The mitral valve is normal in structure. Trivial mitral valve regurgitation. No evidence of mitral stenosis.  4. The aortic valve is normal in structure. Aortic valve regurgitation is not visualized. No aortic stenosis is present.  5. Aortic dilatation noted. There is mild dilatation at the level of the sinuses of Valsalva measuring 37 mm.  6. The inferior vena cava is normal in size with <50% respiratory variability, suggesting right atrial pressure of 8 mmHg. FINDINGS  Left Ventricle: Left ventricular ejection fraction, by  estimation, is 45 to 50%. The left ventricle has mildly decreased function. The left ventricle demonstrates global hypokinesis. The left ventricular internal cavity size  was normal in size. There is  mild concentric left ventricular hypertrophy. Left ventricular diastolic parameters are consistent with Grade I diastolic dysfunction (impaired relaxation). Indeterminate filling pressures. Right Ventricle: The right ventricular size is normal. No increase in right ventricular wall thickness. Right ventricular systolic function is normal. There is normal pulmonary artery systolic pressure. The tricuspid regurgitant velocity is 1.36 m/s, and  with an assumed right atrial pressure of 8 mmHg, the estimated right ventricular systolic pressure is 123XX123 mmHg. Left Atrium: Left atrial size was normal in size. Right Atrium: Right atrial size was normal in size. Pericardium: There is no evidence of pericardial effusion. Mitral Valve: The mitral valve is normal in structure. Normal mobility of the mitral valve leaflets. Trivial mitral valve regurgitation. No evidence of mitral valve stenosis. Tricuspid Valve: The tricuspid valve is normal in structure. Tricuspid valve regurgitation is trivial. No evidence of tricuspid stenosis. Aortic Valve: The aortic valve is normal in structure. Aortic valve regurgitation is not visualized. No aortic stenosis is present. Pulmonic Valve: The pulmonic valve was normal in structure. Pulmonic valve regurgitation is not visualized. No evidence of pulmonic stenosis. Aorta: Aortic dilatation noted. There is mild dilatation at the level of the sinuses of Valsalva measuring 37 mm. Venous: The inferior vena cava is normal in size with less than 50% respiratory variability, suggesting right atrial pressure of 8 mmHg. IAS/Shunts: No atrial level shunt detected by color flow Doppler.  LEFT VENTRICLE PLAX 2D LVIDd:         4.40 cm  Diastology LVIDs:         3.20 cm  LV e' lateral:   4.24 cm/s LV PW:          1.10 cm  LV E/e' lateral: 12.2 LV IVS:        1.20 cm  LV e' medial:    4.57 cm/s LVOT diam:     2.40 cm  LV E/e' medial:  11.3 LV SV:         87 LV SV Index:   40 LVOT Area:     4.52 cm  RIGHT VENTRICLE RV S prime:     14.60 cm/s TAPSE (M-mode): 2.2 cm LEFT ATRIUM             Index       RIGHT ATRIUM           Index LA diam:        3.30 cm 1.51 cm/m  RA Area:     13.90 cm LA Vol (A2C):   57.3 ml 26.17 ml/m RA Volume:   27.90 ml  12.74 ml/m LA Vol (A4C):   36.5 ml 16.67 ml/m LA Biplane Vol: 45.6 ml 20.83 ml/m  AORTIC VALVE LVOT Vmax:   85.50 cm/s LVOT Vmean:  47.000 cm/s LVOT VTI:    0.193 m  AORTA Ao Root diam: 3.70 cm MITRAL VALVE                TRICUSPID VALVE MV Area (PHT): 3.12 cm     TR Peak grad:   7.4 mmHg MV Decel Time: 243 msec     TR Vmax:        136.00 cm/s MV E velocity: 51.80 cm/s MV A velocity: 104.00 cm/s  SHUNTS MV E/A ratio:  0.50         Systemic VTI:  0.19 m  Systemic Diam: 2.40 cm Skeet Latch MD Electronically signed by Skeet Latch MD Signature Date/Time: 10/10/2019/4:28:26 PM    Final     Cardiac Studies   Echo: 10/10/19  IMPRESSIONS    1. Left ventricular ejection fraction, by estimation, is 45 to 50%. The  left ventricle has mildly decreased function. The left ventricle  demonstrates global hypokinesis. There is mild concentric left ventricular  hypertrophy. Left ventricular diastolic  parameters are consistent with Grade I diastolic dysfunction (impaired  relaxation).  2. Right ventricular systolic function is normal. The right ventricular  size is normal. There is normal pulmonary artery systolic pressure.  3. The mitral valve is normal in structure. Trivial mitral valve  regurgitation. No evidence of mitral stenosis.  4. The aortic valve is normal in structure. Aortic valve regurgitation is  not visualized. No aortic stenosis is present.  5. Aortic dilatation noted. There is mild dilatation at the level of the  sinuses  of Valsalva measuring 37 mm.  6. The inferior vena cava is normal in size with <50% respiratory  variability, suggesting right atrial pressure of 8 mmHg.   Cath: 10/10/19  Conclusions: 1. Severe single vessel coronary artery disease with 90% ostial/proximal LAD disease followed by 75% stenosis at the bifurcation with previously stented D1 ([place] 0,1,0). 2. Mild to moderate, non-obstructive disease involving ramus intermedius and proximal RCA (DFR 0.94). 3. Previously placed stents in D1, LCx, proximal RCA, and mid RCA are patent, with mild in-stent restenosis noted in the LCx and mid RCA. 4. Mildly to moderately reduced left ventricular contraction (LVEF ~45%) with normal filling pressure.  Recommendations: 1. Images were reviewed with Dr. Gwenlyn Found.  We have agreed that surgical revascularization may be preferential for treatment of complex LAD disease.  PCI to ostial and proximal LAD is feasible but could jeopardize ramus intermedius/LCx and D1. 2. Discontinue clopidogrel pending cardiac surgery consultation.  Heparin infusion to be restarted 2 hours after TR band deflation. 3. Aggressive secondary prevention of coronary artery disease. 4. Follow-up echocardiogram interpretation.  Nelva Bush, MD [facility]  Diagnostic Dominance: Right    Patient Profile     84 y.o. male malewith past medical history of CAD (s/p BMS to RCA and staged PCI to LCx in 2009, DES to mid-RCA and DES to D1 in 04/2014, NST in 02/2018 showing scar with no current ischemia),anaphylaxis to aspirin,LBBB,HTN,CKD stage II,mild LV dysfucntionand HLD who presented to [facility] ED on 10/09/2019 for evaluation of chest pain and troponin returned abnormal.Transferred to [facility] for further evaluation.  Assessment & Plan    1. NSTEMI: HS troponin132-->499-->705 (no check since yesterday). ECG with chronic LBBB and no acute ST/TW changes. Underwent cardiac cath noted above with severe multivessel disease.  TCTS consulted with plans for CABG tentatively on Friday.  - Plavix stopped (4/12), Imdur BB and statin   2. HTN: remains uncontrolled, Norvasc increased to 10mg  qd this admission - increase Coreg to 25mg  BID. Consider restarting lisinopril tomorrow as long as renal function stays stable.   3. HLD: - 10/10/2019: Cholesterol 148; HDL 30; LDL Cholesterol 96; Triglycerides 110; VLDL 22 - Continue Lipitor 80mg  qd  4. CKD II: - Scr stable at 1.3 today - daily BMET  For questions or updates, please contact [place] Please consult [email or web address] for contact info under   Signed, Reino Bellis, NP  10/11/2019, 8:48 AM    Agree with note by Reino Bellis NP-C  Mr. Hueston was admitted with non-STEMI.  He had a prior stent in  the diagonal branch.  His peak troponin was 700.  His EF is in the 45% range.  Card catheterization revealed a tight ostial LAD lesion with disease at the takeoff of the diagonal branch as well.  His anatomy is not suitable for percutaneous intervention.  He was seen by Dr. Cyndia Bent yesterday who thought he was a acceptable CABG candidate.  Plavix was held for washout.  P2 Y 12 test will be performed on Thursday with anticipation for bypass on Friday.  He remains on IV heparin.  He is pain-free.  His exam is benign.  Lorretta Harp, M.D., [place], [facility], [place] [address] [place], [place]  [id number]  [phone] 10/11/2019 11:23 AM

## 2019-10-12 ENCOUNTER — Inpatient Hospital Stay (HOSPITAL_COMMUNITY): Payer: Medicare HMO

## 2019-10-12 DIAGNOSIS — E7800 Pure hypercholesterolemia, unspecified: Secondary | ICD-10-CM | POA: Diagnosis not present

## 2019-10-12 DIAGNOSIS — I214 Non-ST elevation (NSTEMI) myocardial infarction: Secondary | ICD-10-CM | POA: Diagnosis not present

## 2019-10-12 DIAGNOSIS — I5042 Chronic combined systolic (congestive) and diastolic (congestive) heart failure: Secondary | ICD-10-CM | POA: Diagnosis not present

## 2019-10-12 LAB — CBC
HCT: 35.1 % — ABNORMAL LOW (ref 39.0–52.0)
Hemoglobin: 11.3 g/dL — ABNORMAL LOW (ref 13.0–17.0)
MCH: 26.7 pg (ref 26.0–34.0)
MCHC: 32.2 g/dL (ref 30.0–36.0)
MCV: 83 fL (ref 80.0–100.0)
Platelets: 192 10*3/uL (ref 150–400)
RBC: 4.23 MIL/uL (ref 4.22–5.81)
RDW: 17.2 % — ABNORMAL HIGH (ref 11.5–15.5)
WBC: 7 10*3/uL (ref 4.0–10.5)
nRBC: 0 % (ref 0.0–0.2)

## 2019-10-12 LAB — BASIC METABOLIC PANEL
Anion gap: 10 (ref 5–15)
BUN: 14 mg/dL (ref 8–23)
CO2: 23 mmol/L (ref 22–32)
Calcium: 8.9 mg/dL (ref 8.9–10.3)
Chloride: 108 mmol/L (ref 98–111)
Creatinine, Ser: 1.32 mg/dL — ABNORMAL HIGH (ref 0.61–1.24)
GFR calc Af Amer: 58 mL/min — ABNORMAL LOW (ref >60)
GFR calc non Af Amer: 50 mL/min — ABNORMAL LOW (ref >60)
Glucose, Bld: 104 mg/dL — ABNORMAL HIGH (ref 70–99)
Potassium: 3.7 mmol/L (ref 3.5–5.1)
Sodium: 141 mmol/L (ref 135–145)

## 2019-10-12 LAB — PULMONARY FUNCTION TEST
FEF 25-75 Pre: 2.47 L/sec
FEF2575-%Pred-Pre: 134 %
FEV1-%Pred-Pre: 85 %
FEV1-Pre: 2.05 L
FEV1FVC-%Pred-Pre: 111 %
FEV6-%Pred-Pre: 78 %
FEV6-Pre: 2.47 L
FEV6FVC-%Pred-Pre: 106 %
FVC-%Pred-Pre: 73 %
FVC-Pre: 2.47 L
Pre FEV1/FVC ratio: 83 %
Pre FEV6/FVC Ratio: 100 %

## 2019-10-12 LAB — HEPARIN LEVEL (UNFRACTIONATED): Heparin Unfractionated: 0.36 IU/mL (ref 0.30–0.70)

## 2019-10-12 MED ORDER — POLYETHYLENE GLYCOL 3350 17 G PO PACK
17.0000 g | PACK | Freq: Every day | ORAL | Status: DC
  Administered 2019-10-12: 11:00:00 17 g via ORAL
  Filled 2019-10-12 (×2): qty 1

## 2019-10-12 MED ORDER — HYDRALAZINE HCL 25 MG PO TABS
25.0000 mg | ORAL_TABLET | Freq: Three times a day (TID) | ORAL | Status: DC
  Administered 2019-10-12 – 2019-10-14 (×6): 25 mg via ORAL
  Filled 2019-10-12 (×6): qty 1

## 2019-10-12 MED ORDER — IPRATROPIUM-ALBUTEROL 0.5-2.5 (3) MG/3ML IN SOLN
3.0000 mL | Freq: Four times a day (QID) | RESPIRATORY_TRACT | Status: DC | PRN
  Administered 2019-10-15: 02:00:00 3 mL via RESPIRATORY_TRACT
  Filled 2019-10-12: qty 3

## 2019-10-12 MED ORDER — IPRATROPIUM-ALBUTEROL 0.5-2.5 (3) MG/3ML IN SOLN
3.0000 mL | RESPIRATORY_TRACT | Status: AC
  Administered 2019-10-12: 11:00:00 3 mL via RESPIRATORY_TRACT
  Filled 2019-10-12: qty 3

## 2019-10-12 NOTE — Progress Notes (Signed)
CARDIAC REHAB PHASE I   PRE:  Rate/Rhythm: 58 SB    BP: sitting 141/66    SaO2: 94 RA  MODE:  Ambulation: 470 ft   POST:  Rate/Rhythm: 86 SR    BP: sitting 181/84     SaO2: 96 RA  Pt eager to walk. Tolerated well although somewhat SOB afterward. Denied CP and actually wanted to do another lap. To recliner, BP elevated. Discussed sternal precautions, IS (1600 mL), mobility post op, and d/c planning. 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 Gave materials to review. His family is supportive and live nearby. [place], ACSM 10/12/2019 11:54 AM

## 2019-10-12 NOTE — Progress Notes (Addendum)
Progress Note  Patient Name: Corey [name] Date of Encounter: 10/12/2019  Primary Cardiologist: Rozann Lesches, MD   Subjective   No complaints this morning.   Inpatient Medications    Scheduled Meds: . amLODipine  10 mg Oral Daily  . atorvastatin  80 mg Oral q1800  . carvedilol  25 mg Oral BID WC  . isosorbide mononitrate  30 mg Oral Daily  . polyethylene glycol  17 g Oral Daily  . sodium chloride flush  3 mL Intravenous Q12H  . sodium chloride flush  3 mL Intravenous Q12H  . sodium chloride flush  3 mL Intravenous Q12H   Continuous Infusions: . sodium chloride    . sodium chloride    . heparin 950 Units/hr (10/11/19 0720)   PRN Meds: sodium chloride, sodium chloride, acetaminophen, nitroGLYCERIN, ondansetron (ZOFRAN) IV, sodium chloride flush, sodium chloride flush   Vital Signs    Vitals:   10/12/19 0314 10/12/19 0322 10/12/19 0650 10/12/19 0815  BP:  (!) 179/94 134/64 (!) 194/83  Pulse:  64 63 65  Resp:  18    Temp:  98 F (36.7 C)  97.8 F (36.6 C)  TempSrc:  Oral  Oral  SpO2:  98%  99%  Weight: 106.4 kg     Height:        Intake/Output Summary (Last 24 hours) at 10/12/2019 0933 Last data filed at 10/12/2019 0833 Gross per 24 hour  Intake 787.8 ml  Output 1405 ml  Net -617.2 ml   Last 3 Weights 10/12/2019 10/11/2019 10/10/2019  Weight (lbs) 234 lb 9.6 oz 235 lb 14.4 oz 235 lb 1.6 oz  Weight (kg) 106.414 kg 107.004 kg 106.641 kg      Telemetry    SR - Personally Reviewed  ECG    No new tracing.   Physical Exam  Pleasant older AAM GEN: No acute distress.   Neck: No JVD Cardiac: RRR, no murmurs, rubs, or gallops.  Respiratory: Expiratory wheezing bilaterally. GI: Soft, nontender, non-distended  MS: No edema; No deformity. Neuro:  Nonfocal  Psych: Normal affect   Labs    High Sensitivity Troponin:   Recent Labs  Lab 10/09/19 0252 10/09/19 0432 10/09/19 0821 10/09/19 1200  TROPONINIHS 13 132* 499* 705*       Chemistry Recent Labs  Lab 10/09/19 0252 10/09/19 0252 10/10/19 0511 10/11/19 0640 10/12/19 0435  NA 140   < > 142 141 141  K 3.8   < > 3.9 4.2 3.7  CL 108   < > 108 110 108  CO2 24   < > 24 21* 23  GLUCOSE 124*   < > 103* 101* 104*  BUN 17   < > 13 15 14   CREATININE 1.37*   < > 1.33* 1.30* 1.32*  CALCIUM 9.3   < > 9.1 8.8* 8.9  PROT 7.6  --  6.5  --   --   ALBUMIN 4.0  --  3.4*  --   --   AST 23  --  24  --   --   ALT 22  --  19  --   --   ALKPHOS 39  --  35*  --   --   BILITOT 0.5  --  0.9  --   --   GFRNONAA 48*   < > 50* 51* 50*  GFRAA 56*   < > 58* 59* 58*  ANIONGAP 8   < > 10 10 10    < > =  values in this interval not displayed.     Hematology Recent Labs  Lab 10/10/19 0511 10/11/19 0640 10/12/19 0435  WBC 7.5 7.1 7.0  RBC 4.38 4.16* 4.23  HGB 11.7* 11.0* 11.3*  HCT 36.4* 34.7* 35.1*  MCV 83.1 83.4 83.0  MCH 26.7 26.4 26.7  MCHC 32.1 31.7 32.2  RDW 17.2* 17.3* 17.2*  PLT 213 195 192    BNPNo results for input(s): BNP, PROBNP in the last 168 hours.   DDimer No results for input(s): DDIMER in the last 168 hours.   Radiology    CARDIAC CATHETERIZATION  Result Date: 10/10/2019 Conclusions: 1. Severe single vessel coronary artery disease with 90% ostial/proximal LAD disease followed by 75% stenosis at the bifurcation with previously stented D1 ([place] 0,1,0). 2. Mild to moderate, non-obstructive disease involving ramus intermedius and proximal RCA (DFR 0.94). 3. Previously placed stents in D1, LCx, proximal RCA, and mid RCA are patent, with mild in-stent restenosis noted in the LCx and mid RCA. 4. Mildly to moderately reduced left ventricular contraction (LVEF ~45%) with normal filling pressure. Recommendations: 1. Images were reviewed with Dr. Gwenlyn Found.  We have agreed that surgical revascularization may be preferential for treatment of complex LAD disease.  PCI to ostial and proximal LAD is feasible but could jeopardize ramus intermedius/LCx and D1. 2.  Discontinue clopidogrel pending cardiac surgery consultation.  Heparin infusion to be restarted 2 hours after TR band deflation. 3. Aggressive secondary prevention of coronary artery disease. 4. Follow-up echocardiogram interpretation. Nelva Bush, MD [facility]   ECHOCARDIOGRAM COMPLETE  Result Date: 10/10/2019    ECHOCARDIOGRAM REPORT   Patient Name:   Corey [name] Date of Exam: 10/10/2019 Medical Rec #:  [id number]          Height:       68.0 in Accession #:    [id number]         Weight:       235.1 lb Date of Birth:  1936/02/17         BSA:          2.189 m Patient Age:    84 years           BP:           166/74 mmHg Patient Gender: M                  HR:           62 bpm. Exam Location:  Inpatient Procedure: 2D Echo Indications:    NSTEMI I21.4  History:        Patient has prior history of Echocardiogram examinations, most                 recent 03/19/2018. Previous Myocardial Infarction; Risk                 Factors:Hypertension and Dyslipidemia.  Sonographer:    Mikki Santee RDCS (AE) Referring Phys: [id number] [place]  1. Left ventricular ejection fraction, by estimation, is 45 to 50%. The left ventricle has mildly decreased function. The left ventricle demonstrates global hypokinesis. There is mild concentric left ventricular hypertrophy. Left ventricular diastolic parameters are consistent with Grade I diastolic dysfunction (impaired relaxation).  2. Right ventricular systolic function is normal. The right ventricular size is normal. There is normal pulmonary artery systolic pressure.  3. The mitral valve is normal in structure. Trivial mitral valve regurgitation. No evidence of mitral stenosis.  4. The aortic valve is normal in  structure. Aortic valve regurgitation is not visualized. No aortic stenosis is present.  5. Aortic dilatation noted. There is mild dilatation at the level of the sinuses of Valsalva measuring 37 mm.  6. The inferior vena cava is normal in  size with <50% respiratory variability, suggesting right atrial pressure of 8 mmHg. FINDINGS  Left Ventricle: Left ventricular ejection fraction, by estimation, is 45 to 50%. The left ventricle has mildly decreased function. The left ventricle demonstrates global hypokinesis. The left ventricular internal cavity size was normal in size. There is  mild concentric left ventricular hypertrophy. Left ventricular diastolic parameters are consistent with Grade I diastolic dysfunction (impaired relaxation). Indeterminate filling pressures. Right Ventricle: The right ventricular size is normal. No increase in right ventricular wall thickness. Right ventricular systolic function is normal. There is normal pulmonary artery systolic pressure. The tricuspid regurgitant velocity is 1.36 m/s, and  with an assumed right atrial pressure of 8 mmHg, the estimated right ventricular systolic pressure is 123XX123 mmHg. Left Atrium: Left atrial size was normal in size. Right Atrium: Right atrial size was normal in size. Pericardium: There is no evidence of pericardial effusion. Mitral Valve: The mitral valve is normal in structure. Normal mobility of the mitral valve leaflets. Trivial mitral valve regurgitation. No evidence of mitral valve stenosis. Tricuspid Valve: The tricuspid valve is normal in structure. Tricuspid valve regurgitation is trivial. No evidence of tricuspid stenosis. Aortic Valve: The aortic valve is normal in structure. Aortic valve regurgitation is not visualized. No aortic stenosis is present. Pulmonic Valve: The pulmonic valve was normal in structure. Pulmonic valve regurgitation is not visualized. No evidence of pulmonic stenosis. Aorta: Aortic dilatation noted. There is mild dilatation at the level of the sinuses of Valsalva measuring 37 mm. Venous: The inferior vena cava is normal in size with less than 50% respiratory variability, suggesting right atrial pressure of 8 mmHg. IAS/Shunts: No atrial level shunt detected  by color flow Doppler.  LEFT VENTRICLE PLAX 2D LVIDd:         4.40 cm  Diastology LVIDs:         3.20 cm  LV e' lateral:   4.24 cm/s LV PW:         1.10 cm  LV E/e' lateral: 12.2 LV IVS:        1.20 cm  LV e' medial:    4.57 cm/s LVOT diam:     2.40 cm  LV E/e' medial:  11.3 LV SV:         87 LV SV Index:   40 LVOT Area:     4.52 cm  RIGHT VENTRICLE RV S prime:     14.60 cm/s TAPSE (M-mode): 2.2 cm LEFT ATRIUM             Index       RIGHT ATRIUM           Index LA diam:        3.30 cm 1.51 cm/m  RA Area:     13.90 cm LA Vol (A2C):   57.3 ml 26.17 ml/m RA Volume:   27.90 ml  12.74 ml/m LA Vol (A4C):   36.5 ml 16.67 ml/m LA Biplane Vol: 45.6 ml 20.83 ml/m  AORTIC VALVE LVOT Vmax:   85.50 cm/s LVOT Vmean:  47.000 cm/s LVOT VTI:    0.193 m  AORTA Ao Root diam: 3.70 cm MITRAL VALVE                TRICUSPID VALVE MV  Area (PHT): 3.12 cm     TR Peak grad:   7.4 mmHg MV Decel Time: 243 msec     TR Vmax:        136.00 cm/s MV E velocity: 51.80 cm/s MV A velocity: 104.00 cm/s  SHUNTS MV E/A ratio:  0.50         Systemic VTI:  0.19 m                             Systemic Diam: 2.40 cm Skeet Latch MD Electronically signed by Skeet Latch MD Signature Date/Time: 10/10/2019/4:28:26 PM    Final    VAS US DOPPLER PRE CABG  Result Date: 10/11/2019 PREOPERATIVE VASCULAR EVALUATION  Indications:      Pre-CABG. Risk Factors:     Hypertension, hyperlipidemia. Comparison Study: no prior Performing Technologist: Abram Sander RVS  Examination Guidelines: A complete evaluation includes B-mode imaging, spectral Doppler, color Doppler, and power Doppler as needed of all accessible portions of each vessel. Bilateral testing is considered an integral part of a complete examination. Limited examinations for reoccurring indications may be performed as noted.  Right Carotid Findings: +----------+--------+--------+--------+------------+--------+           PSV cm/sEDV cm/sStenosisDescribe    Comments  +----------+--------+--------+--------+------------+--------+ CCA Prox  51      9               heterogenous         +----------+--------+--------+--------+------------+--------+ CCA Distal61      12              heterogenous         +----------+--------+--------+--------+------------+--------+ ICA Prox  81      11      1-39%   heterogenous         +----------+--------+--------+--------+------------+--------+ ICA Distal58      14                                   +----------+--------+--------+--------+------------+--------+ ECA       78      10                                   +----------+--------+--------+--------+------------+--------+ Portions of this table do not appear on this page. +----------+--------+-------+--------+------------+           PSV cm/sEDV cmsDescribeArm Pressure +----------+--------+-------+--------+------------+ Subclavian90                                  +----------+--------+-------+--------+------------+ +---------+--------+--+--------+--+---------+ VertebralPSV cm/s36EDV cm/s11Antegrade +---------+--------+--+--------+--+---------+ Left Carotid Findings: +----------+--------+--------+--------+------------+--------+           PSV cm/sEDV cm/sStenosisDescribe    Comments +----------+--------+--------+--------+------------+--------+ CCA Prox  70      14              heterogenous         +----------+--------+--------+--------+------------+--------+ CCA Distal77      21              heterogenous         +----------+--------+--------+--------+------------+--------+ ICA Prox  91      26      1-39%   heterogenous         +----------+--------+--------+--------+------------+--------+ ICA Distal70      25                                   +----------+--------+--------+--------+------------+--------+  ECA       144     9                                     +----------+--------+--------+--------+------------+--------+ +----------+--------+--------+--------+------------+ SubclavianPSV cm/sEDV cm/sDescribeArm Pressure +----------+--------+--------+--------+------------+           86                      162          +----------+--------+--------+--------+------------+ +---------+--------+--------+--------------+ VertebralPSV cm/sEDV cm/sNot identified +---------+--------+--------+--------------+  ABI Findings: +--------+------------------+-----+---------+--------+ Right   Rt Pressure (mmHg)IndexWaveform Comment  +--------+------------------+-----+---------+--------+ Brachial                       triphasic         +--------+------------------+-----+---------+--------+ ATA                            triphasic         +--------+------------------+-----+---------+--------+ PTA                            triphasic         +--------+------------------+-----+---------+--------+ +--------+------------------+-----+---------+-------+ Left    Lt Pressure (mmHg)IndexWaveform Comment +--------+------------------+-----+---------+-------+ [id number]                    triphasic        +--------+------------------+-----+---------+-------+ ATA                            triphasic        +--------+------------------+-----+---------+-------+ PTA                            triphasic        +--------+------------------+-----+---------+-------+  Right Doppler Findings: +--------+--------+-----+---------+--------+ Site    PressureIndexDoppler  Comments +--------+--------+-----+---------+--------+ Brachial             triphasic         +--------+--------+-----+---------+--------+ Radial               triphasic         +--------+--------+-----+---------+--------+ Ulnar                triphasic         +--------+--------+-----+---------+--------+  Left Doppler Findings:  +--------+--------+-----+---------+--------+ Site    PressureIndexDoppler  Comments +--------+--------+-----+---------+--------+ [id number]          triphasic         +--------+--------+-----+---------+--------+ Radial               triphasic         +--------+--------+-----+---------+--------+ Ulnar                triphasic         +--------+--------+-----+---------+--------+  Summary: Right Carotid: Velocities in the right ICA are consistent with a 1-39% stenosis. Left Carotid: Velocities in the left ICA are consistent with a 1-39% stenosis. Vertebrals: Right vertebral artery demonstrates antegrade flow. Left vertebral             artery was not visualized. Right Upper Extremity: Doppler waveforms remain within normal limits with right radial compression. Doppler waveform obliterate with right ulnar compression. Left Upper Extremity: Doppler waveforms remain within normal limits with left radial compression. Doppler waveforms  remain within normal limits with left ulnar compression.  Electronically signed by Deitra Mayo MD on 10/11/2019 at 3:19:11 PM.    Final     Cardiac Studies   Echo: 10/10/19  IMPRESSIONS    1. Left ventricular ejection fraction, by estimation, is 45 to 50%. The  left ventricle has mildly decreased function. The left ventricle  demonstrates global hypokinesis. There is mild concentric left ventricular  hypertrophy. Left ventricular diastolic  parameters are consistent with Grade I diastolic dysfunction (impaired  relaxation).  2. Right ventricular systolic function is normal. The right ventricular  size is normal. There is normal pulmonary artery systolic pressure.  3. The mitral valve is normal in structure. Trivial mitral valve  regurgitation. No evidence of mitral stenosis.  4. The aortic valve is normal in structure. Aortic valve regurgitation is  not visualized. No aortic stenosis is present.  5. Aortic dilatation noted. There is  mild dilatation at the level of the  sinuses of Valsalva measuring 37 mm.  6. The inferior vena cava is normal in size with <50% respiratory  variability, suggesting right atrial pressure of 8 mmHg.   Cath: 10/10/19  Conclusions: 1. Severe single vessel coronary artery disease with 90% ostial/proximal LAD disease followed by 75% stenosis at the bifurcation with previously stented D1 ([place] 0,1,0). 2. Mild to moderate, non-obstructive disease involving ramus intermedius and proximal RCA (DFR 0.94). 3. Previously placed stents in D1, LCx, proximal RCA, and mid RCA are patent, with mild in-stent restenosis noted in the LCx and mid RCA. 4. Mildly to moderately reduced left ventricular contraction (LVEF ~45%) with normal filling pressure.  Recommendations: 1. Images were reviewed with Dr. Gwenlyn Found. We have agreed that surgical revascularization may be preferential for treatment of complex LAD disease. PCI to ostial and proximal LAD is feasible but could jeopardize ramus intermedius/LCx and D1. 2. Discontinue clopidogrel pending cardiac surgery consultation. Heparin infusion to be restarted 2 hours after TR band deflation. 3. Aggressive secondary prevention of coronary artery disease. 4. Follow-up echocardiogram interpretation.  Nelva Bush, MD [facility]  Diagnostic Dominance: Right   Patient Profile     84 y.o. 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  Assessment & Plan    1. NSTEMI: HS troponin132-->499-->705. ECG with chronic LBBB and no acute ST/TW changes. Underwent cardiac cath noted above with severe  multivessel disease. TCTS consulted with plans for CABG tentatively on Friday.  - Plavix stopped (4/12), Imdur BB and statin  - P2Y12 check tomorrow  2. HTN: remains uncontrolled, Norvasc increased to 10mg  qd this admission - increased Coreg to 25mg  BID. Will add hydralazine 25mg  TID today.  3. HLD: -10/10/2019: Cholesterol 148; HDL 30; LDL Cholesterol 96; Triglycerides 110; VLDL 22 - Continue Lipitor 80mg  qd  4. CKD II: - Scr stable at 1.3 today - daily BMET  5. Wheezing: expiratory wheezing noted on exam. Will order breathing Tx this morning.  For questions or updates, please contact [place] Please consult [email or web address] for contact info under        Signed, Reino Bellis, NP  10/12/2019, 9:33 AM    Agree with note by Reino Bellis NP-C  Cardiac stable,  plan for CABG on Friday.  Currently having Plavix washout.  Check P2 Y 12 test tomorrow.  Lorretta Harp, M.D., [place], [facility], [place] [address] [place], [place]  [id number]  [phone] 10/12/2019 10:49 AM

## 2019-10-12 NOTE — Progress Notes (Signed)
[place] for Heparin Indication: chest pain/ACS  Allergies  Allergen Reactions  . Aspirin Anaphylaxis  . Other     Foods that are high in acid content cause sores and a rash.   . Penicillins Nausea And Vomiting    Patient Measurements: Height: 5\' 8"  (172.7 cm) Weight: 106.4 kg (234 lb 9.6 oz) IBW/kg (Calculated) : 68.4 Heparin Dosing Weight: 90 kg  Vital Signs: Temp: 97.8 F (36.6 C) (04/14 0815) Temp Source: Oral (04/14 0815) BP: 194/83 (04/14 0815) Pulse Rate: 65 (04/14 0815)  Labs: Recent Labs    10/09/19 1200 10/09/19 1517 10/09/19 2224 10/10/19 0511 10/10/19 0511 10/11/19 0640 10/12/19 0435  HGB  --   --   --  11.7*   < > 11.0* 11.3*  HCT  --   --   --  36.4*  --  34.7* 35.1*  PLT  --   --   --  213  --  195 192  LABPROT  --  14.3  --   --   --   --   --   INR  --  1.1  --   --   --   --   --   HEPARINUNFRC  --  1.07*   < > 0.96*  --  0.55 0.36  CREATININE  --   --   --  1.33*  --  1.30* 1.32*  TROPONINIHS 705*  --   --   --   --   --   --    < > = values in this interval not displayed.    Estimated Creatinine Clearance: 51.9 mL/min (A) (by C-G formula based on SCr of 1.32 mg/dL (H)).   Medical History: Past Medical History:  Diagnosis Date  . Anaphylaxis    Aspirin  . Coronary atherosclerosis of native coronary artery    a. BMS to RCA and LCx 10/09, DES to D1 and RCA 04/2014  . Essential hypertension   . Hyperlipidemia   . Myocardial infarction ([place]) 1998  . NSTEMI (non-ST elevated myocardial infarction) ([place]) 03/2008  . Seasonal allergies     Assessment: 84 y.o. male with chest pain on heparin for NSTEMI. He is s/p cath with plans for CABG on Friday -heparin level 0.36 at goal on heparin drip rate 950 uts/hr  -hg= 11 stable pltc stable 190 no bleeding (s/p slight bleeding at cath site - wrist -yesterday)   Goal of Therapy:  Heparin level 0.3-0.7 units/ml Monitor platelets by anticoagulation protocol:  Yes   Plan:  -continue heparin drip at current rate 950 uts/hr  -daily heparin level and CBC  Bonnita Nasuti Pharm.D. CPP, BCPS Clinical Pharmacist [phone] 10/12/2019 9:43 AM    **Pharmacist phone directory can now be found on [email or web address] ([email or web address]).  Listed under [place].

## 2019-10-12 NOTE — Plan of Care (Signed)

## 2019-10-13 DIAGNOSIS — I214 Non-ST elevation (NSTEMI) myocardial infarction: Secondary | ICD-10-CM | POA: Diagnosis not present

## 2019-10-13 DIAGNOSIS — I5042 Chronic combined systolic (congestive) and diastolic (congestive) heart failure: Secondary | ICD-10-CM | POA: Diagnosis not present

## 2019-10-13 DIAGNOSIS — I25110 Atherosclerotic heart disease of native coronary artery with unstable angina pectoris: Secondary | ICD-10-CM

## 2019-10-13 LAB — HEPARIN LEVEL (UNFRACTIONATED): Heparin Unfractionated: 0.38 IU/mL (ref 0.30–0.70)

## 2019-10-13 LAB — BASIC METABOLIC PANEL
Anion gap: 8 (ref 5–15)
BUN: 13 mg/dL (ref 8–23)
CO2: 25 mmol/L (ref 22–32)
Calcium: 9 mg/dL (ref 8.9–10.3)
Chloride: 107 mmol/L (ref 98–111)
Creatinine, Ser: 1.37 mg/dL — ABNORMAL HIGH (ref 0.61–1.24)
GFR calc Af Amer: 56 mL/min — ABNORMAL LOW (ref >60)
GFR calc non Af Amer: 48 mL/min — ABNORMAL LOW (ref >60)
Glucose, Bld: 103 mg/dL — ABNORMAL HIGH (ref 70–99)
Potassium: 3.7 mmol/L (ref 3.5–5.1)
Sodium: 140 mmol/L (ref 135–145)

## 2019-10-13 LAB — CBC
HCT: 34.5 % — ABNORMAL LOW (ref 39.0–52.0)
Hemoglobin: 11.2 g/dL — ABNORMAL LOW (ref 13.0–17.0)
MCH: 26.9 pg (ref 26.0–34.0)
MCHC: 32.5 g/dL (ref 30.0–36.0)
MCV: 82.7 fL (ref 80.0–100.0)
Platelets: 185 10*3/uL (ref 150–400)
RBC: 4.17 MIL/uL — ABNORMAL LOW (ref 4.22–5.81)
RDW: 17.2 % — ABNORMAL HIGH (ref 11.5–15.5)
WBC: 7.5 10*3/uL (ref 4.0–10.5)
nRBC: 0 % (ref 0.0–0.2)

## 2019-10-13 LAB — TYPE AND SCREEN
ABO/RH(D): A NEG
Antibody Screen: NEGATIVE

## 2019-10-13 LAB — ABO/RH: ABO/RH(D): A NEG

## 2019-10-13 LAB — SURGICAL PCR SCREEN
MRSA, PCR: NEGATIVE
Staphylococcus aureus: NEGATIVE

## 2019-10-13 LAB — PLATELET INHIBITION P2Y12: Platelet Function  P2Y12: 210 [PRU] (ref 182–335)

## 2019-10-13 MED ORDER — SODIUM CHLORIDE 0.9 % IV SOLN
INTRAVENOUS | Status: DC
  Filled 2019-10-13: qty 30

## 2019-10-13 MED ORDER — MAGNESIUM SULFATE 50 % IJ SOLN
40.0000 meq | INTRAMUSCULAR | Status: DC
  Filled 2019-10-13: qty 9.85

## 2019-10-13 MED ORDER — BISACODYL 5 MG PO TBEC
5.0000 mg | DELAYED_RELEASE_TABLET | Freq: Once | ORAL | Status: AC
  Administered 2019-10-13: 21:00:00 5 mg via ORAL
  Filled 2019-10-13: qty 1

## 2019-10-13 MED ORDER — SODIUM CHLORIDE 0.9 % IV SOLN
750.0000 mg | INTRAVENOUS | Status: DC
  Filled 2019-10-13: qty 750

## 2019-10-13 MED ORDER — PLASMA-LYTE 148 IV SOLN
INTRAVENOUS | Status: DC
  Filled 2019-10-13: qty 2.5

## 2019-10-13 MED ORDER — INSULIN REGULAR(HUMAN) IN NACL 100-0.9 UT/100ML-% IV SOLN
INTRAVENOUS | Status: AC
  Administered 2019-10-14: 1 [IU]/h via INTRAVENOUS
  Filled 2019-10-13: qty 100

## 2019-10-13 MED ORDER — TEMAZEPAM 7.5 MG PO CAPS: 15.0000 mg | ORAL_CAPSULE | Freq: Once | ORAL | Status: DC | PRN

## 2019-10-13 MED ORDER — TRANEXAMIC ACID (OHS) BOLUS VIA INFUSION
15.0000 mg/kg | INTRAVENOUS | Status: AC
  Administered 2019-10-14: 08:00:00 1585.5 mg via INTRAVENOUS
  Filled 2019-10-13: qty 1586

## 2019-10-13 MED ORDER — NITROGLYCERIN IN D5W 200-5 MCG/ML-% IV SOLN
2.0000 ug/min | INTRAVENOUS | Status: DC
  Filled 2019-10-13: qty 250

## 2019-10-13 MED ORDER — METOPROLOL TARTRATE 12.5 MG HALF TABLET
12.5000 mg | ORAL_TABLET | Freq: Once | ORAL | Status: AC
  Administered 2019-10-14: 05:00:00 12.5 mg via ORAL
  Filled 2019-10-13: qty 1

## 2019-10-13 MED ORDER — NOREPINEPHRINE 4 MG/250ML-% IV SOLN
0.0000 ug/min | INTRAVENOUS | Status: DC
  Filled 2019-10-13: qty 250

## 2019-10-13 MED ORDER — TRANEXAMIC ACID (OHS) PUMP PRIME SOLUTION
2.0000 mg/kg | INTRAVENOUS | Status: DC
  Filled 2019-10-13: qty 2.11

## 2019-10-13 MED ORDER — DEXMEDETOMIDINE HCL IN NACL 400 MCG/100ML IV SOLN
0.1000 ug/kg/h | INTRAVENOUS | Status: AC
  Administered 2019-10-14 (×2): .3 ug/kg/h via INTRAVENOUS
  Filled 2019-10-13: qty 100

## 2019-10-13 MED ORDER — VANCOMYCIN HCL 1500 MG/300ML IV SOLN
1500.0000 mg | INTRAVENOUS | Status: AC
  Administered 2019-10-14: 08:00:00 1500 mg via INTRAVENOUS
  Filled 2019-10-13: qty 300

## 2019-10-13 MED ORDER — TRANEXAMIC ACID 1000 MG/10ML IV SOLN
1.5000 mg/kg/h | INTRAVENOUS | Status: AC
  Administered 2019-10-14: 09:00:00 1.5 mg/kg/h via INTRAVENOUS
  Filled 2019-10-13: qty 25

## 2019-10-13 MED ORDER — EPINEPHRINE HCL 5 MG/250ML IV SOLN IN NS
0.0000 ug/min | INTRAVENOUS | Status: DC
  Filled 2019-10-13: qty 250

## 2019-10-13 MED ORDER — POTASSIUM CHLORIDE 2 MEQ/ML IV SOLN
80.0000 meq | INTRAVENOUS | Status: DC
  Filled 2019-10-13: qty 40

## 2019-10-13 MED ORDER — CHLORHEXIDINE GLUCONATE CLOTH 2 % EX PADS
6.0000 | MEDICATED_PAD | Freq: Once | CUTANEOUS | Status: AC
  Administered 2019-10-13: 6 via TOPICAL

## 2019-10-13 MED ORDER — CHLORHEXIDINE GLUCONATE CLOTH 2 % EX PADS
6.0000 | MEDICATED_PAD | Freq: Once | CUTANEOUS | Status: AC
  Administered 2019-10-14: 6 via TOPICAL

## 2019-10-13 MED ORDER — PHENYLEPHRINE HCL-NACL 20-0.9 MG/250ML-% IV SOLN
30.0000 ug/min | INTRAVENOUS | Status: AC
  Administered 2019-10-14: 50 ug/min via INTRAVENOUS
  Filled 2019-10-13: qty 250

## 2019-10-13 MED ORDER — DIAZEPAM 5 MG PO TABS
5.0000 mg | ORAL_TABLET | Freq: Once | ORAL | Status: AC
  Administered 2019-10-14: 5 mg via ORAL
  Filled 2019-10-13: qty 1

## 2019-10-13 MED ORDER — CHLORHEXIDINE GLUCONATE 0.12 % MT SOLN
15.0000 mL | Freq: Once | OROMUCOSAL | Status: AC
  Administered 2019-10-14: 15 mL via OROMUCOSAL
  Filled 2019-10-13: qty 15

## 2019-10-13 MED ORDER — MILRINONE LACTATE IN DEXTROSE 20-5 MG/100ML-% IV SOLN
0.3000 ug/kg/min | INTRAVENOUS | Status: DC
  Filled 2019-10-13: qty 100

## 2019-10-13 MED ORDER — SODIUM CHLORIDE 0.9 % IV SOLN
1.5000 g | INTRAVENOUS | Status: AC
  Administered 2019-10-14: .75 g via INTRAVENOUS
  Administered 2019-10-14: 08:00:00 1.5 g via INTRAVENOUS
  Filled 2019-10-13: qty 1.5

## 2019-10-13 NOTE — Progress Notes (Signed)
CARDIAC REHAB PHASE I   PRE:  Rate/Rhythm: 60 SR    BP: sitting 168/94    SaO2:   MODE:  Ambulation: 740 ft   POST:  Rate/Rhythm: 101 ST    BP: sitting 201/93, 5 min later 174/75     SaO2: 99 RA  Pt just washed up and BP elevated. Ambulated 2 laps at his request. Upon return to room c/o slight chest tightness. BP now very elevated. Encouraged relaxing and slowed breathing. RN in to give morning meds. Recheck BP decreasing. Encouraged only one lap at a time later today. All questions answered. [id number]   Darrick Meigs CES, ACSM 10/13/2019 10:59 AM

## 2019-10-13 NOTE — Progress Notes (Signed)
3 Days Post-Op Procedure(s) (LRB): LEFT HEART CATH AND CORONARY ANGIOGRAPHY (N/A) INTRAVASCULAR PRESSURE WIRE/FFR STUDY (N/A) Subjective: No chest pain or shortness of breath yesterday.  Objective: Vital signs in last 24 hours: Temp:  [97.8 F (36.6 C)-98.2 F (36.8 C)] 98.2 F (36.8 C) (04/15 0318) Pulse Rate:  [58-69] 69 (04/15 0318) Cardiac Rhythm: Normal sinus rhythm;Bundle branch block (04/15 0742) Resp:  [18-20] 18 (04/15 0318) BP: (134-194)/(63-91) 134/70 (04/15 0318) SpO2:  [95 %-99 %] 98 % (04/15 0318) Weight:  [105.7 kg] 105.7 kg (04/15 0335)  Hemodynamic parameters for last 24 hours:    Intake/Output from previous day: 04/14 0701 - 04/15 0700 In: 897 [P.O.:650; I.V.:247] Out: 1200 [Urine:1200] Intake/Output this shift: No intake/output data recorded.  General appearance: alert and cooperative Heart: regular rate and rhythm, S1, S2 normal, no murmur Lungs: clear to auscultation bilaterally  Lab Results: Recent Labs    10/12/19 0435 10/13/19 0549  WBC 7.0 7.5  HGB 11.3* 11.2*  HCT 35.1* 34.5*  PLT 192 185   BMET:  Recent Labs    10/12/19 0435 10/13/19 0549  NA 141 140  K 3.7 3.7  CL 108 107  CO2 23 25  GLUCOSE 104* 103*  BUN 14 13  CREATININE 1.32* 1.37*  CALCIUM 8.9 9.0    PT/INR: No results for input(s): LABPROT, INR in the last 72 hours. ABG    Component Value Date/Time   TCO2 22 04/24/2008 1256   CBG (last 3)  No results for input(s): GLUCAP in the last 72 hours.  Assessment/Plan: S/P Procedure(s) (LRB): LEFT HEART CATH AND CORONARY ANGIOGRAPHY (N/A) INTRAVASCULAR PRESSURE WIRE/FFR STUDY (N/A)  Severe single vessel CAD P2Y12 was 210 this am which should be ok for surgery tomorrow. Plan CABG tomorrow morning. He has no further questions.   LOS: 4 days    Gaye Pollack 10/13/2019

## 2019-10-13 NOTE — Care Management Important Message (Signed)
Important Message  Patient Details  Name: Corey [name] MRN: [id number] Date of Birth: 03/02/1936   Medicare Important Message Given:  Yes     Shelda Altes 10/13/2019, 9:23 AM

## 2019-10-13 NOTE — Progress Notes (Addendum)
Progress Note  Patient Name: Corey [name] Date of Encounter: 10/13/2019  Primary Cardiologist: Rozann Lesches, MD   Subjective   Feeling well this morning. No chest pain.   Inpatient Medications    Scheduled Meds:  amLODipine  10 mg Oral Daily   atorvastatin  80 mg Oral q1800   carvedilol  25 mg Oral BID WC   [START ON 10/14/2019] epinephrine  0-10 mcg/min Intravenous To OR   [START ON 10/14/2019] heparin-papaverine-plasmalyte irrigation   Irrigation To OR   hydrALAZINE  25 mg Oral Q8H   [START ON 10/14/2019] insulin   Intravenous To OR   isosorbide mononitrate  30 mg Oral Daily   [START ON 10/14/2019] magnesium sulfate  40 mEq Other To OR   [START ON 10/14/2019] phenylephrine  30-200 mcg/min Intravenous To OR   polyethylene glycol  17 g Oral Daily   [START ON 10/14/2019] potassium chloride  80 mEq Other To OR   sodium chloride flush  3 mL Intravenous Q12H   sodium chloride flush  3 mL Intravenous Q12H   sodium chloride flush  3 mL Intravenous Q12H   [START ON 10/14/2019] tranexamic acid  15 mg/kg Intravenous To OR   [START ON 10/14/2019] tranexamic acid  2 mg/kg Intracatheter To OR   Continuous Infusions:  sodium chloride     sodium chloride     [START ON 10/14/2019] cefUROXime (ZINACEF)  IV     [START ON 10/14/2019] cefUROXime (ZINACEF)  IV     [START ON 10/14/2019] dexmedetomidine     [START ON 10/14/2019] heparin 30,000 units/NS 1000 mL solution for CELLSAVER     heparin 950 Units/hr (10/12/19 0948)   [START ON 10/14/2019] milrinone     [START ON 10/14/2019] nitroGLYCERIN     [START ON 10/14/2019] norepinephrine     [START ON 10/14/2019] tranexamic acid (CYKLOKAPRON) infusion ([facility])     [START ON 10/14/2019] vancomycin     PRN Meds: sodium chloride, sodium chloride, acetaminophen, ipratropium-albuterol, nitroGLYCERIN, ondansetron (ZOFRAN) IV, sodium chloride flush, sodium chloride flush   Vital Signs    Vitals:   10/12/19 1137 10/12/19 1945 10/13/19 0318 10/13/19 0335    BP: (!) 161/91 (!) 144/63 134/70   Pulse: 65 61 69   Resp: 20 20 18    Temp: 97.9 F (36.6 C) 98.1 F (36.7 C) 98.2 F (36.8 C)   TempSrc: Oral Oral Oral   SpO2: 95% 96% 98%   Weight:    105.7 kg  Height:        Intake/Output Summary (Last 24 hours) at 10/13/2019 0902 Last data filed at 10/13/2019 0600 Gross per 24 hour  Intake 677.04 ml  Output 850 ml  Net -172.96 ml   Last 3 Weights 10/13/2019 10/12/2019 10/11/2019  Weight (lbs) 233 lb 1.6 oz 234 lb 9.6 oz 235 lb 14.4 oz  Weight (kg) 105.733 kg 106.414 kg 107.004 kg      Telemetry    SR - Personally Reviewed  ECG    No new tracing.  Physical Exam  Pleasant older AAM, laying in bed.  GEN: No acute distress.   Neck: No JVD Cardiac: RRR, no murmurs, rubs, or gallops.  Respiratory: Clear to auscultation bilaterally. GI: Soft, nontender, non-distended  MS: No edema; No deformity. Neuro:  Nonfocal  Psych: Normal affect   Labs    High Sensitivity Troponin:   Recent Labs  Lab 10/09/19 0252 10/09/19 0432 10/09/19 0821 10/09/19 1200  TROPONINIHS 13 132* 499* 705*  Chemistry Recent Labs  Lab 10/09/19 0252 10/09/19 0252 10/10/19 0511 10/10/19 0511 10/11/19 0640 10/12/19 0435 10/13/19 0549  NA 140   < > 142   < > 141 141 140  K 3.8   < > 3.9   < > 4.2 3.7 3.7  CL 108   < > 108   < > 110 108 107  CO2 24   < > 24   < > 21* 23 25  GLUCOSE 124*   < > 103*   < > 101* 104* 103*  BUN 17   < > 13   < > 15 14 13   CREATININE 1.37*   < > 1.33*   < > 1.30* 1.32* 1.37*  CALCIUM 9.3   < > 9.1   < > 8.8* 8.9 9.0  PROT 7.6  --  6.5  --   --   --   --   ALBUMIN 4.0  --  3.4*  --   --   --   --   AST 23  --  24  --   --   --   --   ALT 22  --  19  --   --   --   --   ALKPHOS 39  --  35*  --   --   --   --   BILITOT 0.5  --  0.9  --   --   --   --   GFRNONAA 48*   < > 50*   < > 51* 50* 48*  GFRAA 56*   < > 58*   < > 59* 58* 56*  ANIONGAP 8   < > 10   < > 10 10 8    < > = values in this interval not displayed.      Hematology Recent Labs  Lab 10/11/19 0640 10/12/19 0435 10/13/19 0549  WBC 7.1 7.0 7.5  RBC 4.16* 4.23 4.17*  HGB 11.0* 11.3* 11.2*  HCT 34.7* 35.1* 34.5*  MCV 83.4 83.0 82.7  MCH 26.4 26.7 26.9  MCHC 31.7 32.2 32.5  RDW 17.3* 17.2* 17.2*  PLT 195 192 185    BNPNo results for input(s): BNP, PROBNP in the last 168 hours.   DDimer No results for input(s): DDIMER in the last 168 hours.   Radiology    VAS US DOPPLER PRE CABG  Result Date: 10/11/2019 PREOPERATIVE VASCULAR EVALUATION  Indications:      Pre-CABG. Risk Factors:     Hypertension, hyperlipidemia. Comparison Study: no prior Performing Technologist: Abram Sander RVS  Examination Guidelines: A complete evaluation includes B-mode imaging, spectral Doppler, color Doppler, and power Doppler as needed of all accessible portions of each vessel. Bilateral testing is considered an integral part of a complete examination. Limited examinations for reoccurring indications may be performed as noted.  Right Carotid Findings: +----------+--------+--------+--------+------------+--------+           PSV cm/sEDV cm/sStenosisDescribe    Comments +----------+--------+--------+--------+------------+--------+ CCA Prox  51      9               heterogenous         +----------+--------+--------+--------+------------+--------+ CCA Distal61      12              heterogenous         +----------+--------+--------+--------+------------+--------+ ICA Prox  81      11      1-39%   heterogenous         +----------+--------+--------+--------+------------+--------+  ICA Distal58      14                                   +----------+--------+--------+--------+------------+--------+ ECA       78      10                                   +----------+--------+--------+--------+------------+--------+ Portions of this table do not appear on this page. +----------+--------+-------+--------+------------+           PSV cm/sEDV  cmsDescribeArm Pressure +----------+--------+-------+--------+------------+ Subclavian90                                  +----------+--------+-------+--------+------------+ +---------+--------+--+--------+--+---------+ VertebralPSV cm/s36EDV cm/s11Antegrade +---------+--------+--+--------+--+---------+ Left Carotid Findings: +----------+--------+--------+--------+------------+--------+           PSV cm/sEDV cm/sStenosisDescribe    Comments +----------+--------+--------+--------+------------+--------+ CCA Prox  70      14              heterogenous         +----------+--------+--------+--------+------------+--------+ CCA Distal77      21              heterogenous         +----------+--------+--------+--------+------------+--------+ ICA Prox  91      26      1-39%   heterogenous         +----------+--------+--------+--------+------------+--------+ ICA Distal70      25                                   +----------+--------+--------+--------+------------+--------+ ECA       144     9                                    +----------+--------+--------+--------+------------+--------+ +----------+--------+--------+--------+------------+ SubclavianPSV cm/sEDV cm/sDescribeArm Pressure +----------+--------+--------+--------+------------+           86                      162          +----------+--------+--------+--------+------------+ +---------+--------+--------+--------------+ VertebralPSV cm/sEDV cm/sNot identified +---------+--------+--------+--------------+  ABI Findings: +--------+------------------+-----+---------+--------+ Right   Rt Pressure (mmHg)IndexWaveform Comment  +--------+------------------+-----+---------+--------+ Brachial                       triphasic         +--------+------------------+-----+---------+--------+ ATA                            triphasic         +--------+------------------+-----+---------+--------+  PTA                            triphasic         +--------+------------------+-----+---------+--------+ +--------+------------------+-----+---------+-------+ Left    Lt Pressure (mmHg)IndexWaveform Comment +--------+------------------+-----+---------+-------+ [id number]                    triphasic        +--------+------------------+-----+---------+-------+ ATA  triphasic        +--------+------------------+-----+---------+-------+ PTA                            triphasic        +--------+------------------+-----+---------+-------+  Right Doppler Findings: +--------+--------+-----+---------+--------+ Site    PressureIndexDoppler  Comments +--------+--------+-----+---------+--------+ Brachial             triphasic         +--------+--------+-----+---------+--------+ Radial               triphasic         +--------+--------+-----+---------+--------+ Ulnar                triphasic         +--------+--------+-----+---------+--------+  Left Doppler Findings: +--------+--------+-----+---------+--------+ Site    PressureIndexDoppler  Comments +--------+--------+-----+---------+--------+ [id number]          triphasic         +--------+--------+-----+---------+--------+ Radial               triphasic         +--------+--------+-----+---------+--------+ Ulnar                triphasic         +--------+--------+-----+---------+--------+  Summary: Right Carotid: Velocities in the right ICA are consistent with a 1-39% stenosis. Left Carotid: Velocities in the left ICA are consistent with a 1-39% stenosis. Vertebrals: Right vertebral artery demonstrates antegrade flow. Left vertebral             artery was not visualized. Right Upper Extremity: Doppler waveforms remain within normal limits with right radial compression. Doppler waveform obliterate with right ulnar compression. Left Upper Extremity: Doppler waveforms remain  within normal limits with left radial compression. Doppler waveforms remain within normal limits with left ulnar compression.  Electronically signed by Deitra Mayo MD on 10/11/2019 at 3:19:11 PM.    Final     Cardiac Studies   Echo: 10/10/19   IMPRESSIONS     1. Left ventricular ejection fraction, by estimation, is 45 to 50%. The  left ventricle has mildly decreased function. The left ventricle  demonstrates global hypokinesis. There is mild concentric left ventricular  hypertrophy. Left ventricular diastolic  parameters are consistent with Grade I diastolic dysfunction (impaired  relaxation).   2. Right ventricular systolic function is normal. The right ventricular  size is normal. There is normal pulmonary artery systolic pressure.   3. The mitral valve is normal in structure. Trivial mitral valve  regurgitation. No evidence of mitral stenosis.   4. The aortic valve is normal in structure. Aortic valve regurgitation is  not visualized. No aortic stenosis is present.   5. Aortic dilatation noted. There is mild dilatation at the level of the  sinuses of Valsalva measuring 37 mm.   6. The inferior vena cava is normal in size with <50% respiratory  variability, suggesting right atrial pressure of 8 mmHg.    Cath: 10/10/19   Conclusions: Severe single vessel coronary artery disease with 90% ostial/proximal LAD disease followed by 75% stenosis at the bifurcation with previously stented D1 ([place] 0,1,0). Mild to moderate, non-obstructive disease involving ramus intermedius and proximal RCA (DFR 0.94). Previously placed stents in D1, LCx, proximal RCA, and mid RCA are patent, with mild in-stent restenosis noted in the LCx and mid RCA. Mildly to moderately reduced left ventricular contraction (LVEF ~45%) with normal filling pressure.   Recommendations: Images were reviewed with Dr. Gwenlyn Found.  We have  agreed that surgical revascularization may be preferential for treatment of complex  LAD disease.  PCI to ostial and proximal LAD is feasible but could jeopardize ramus intermedius/LCx and D1. Discontinue clopidogrel pending cardiac surgery consultation.  Heparin infusion to be restarted 2 hours after TR band deflation. Aggressive secondary prevention of coronary artery disease. Follow-up echocardiogram interpretation.   Nelva Bush, MD [facility]   Diagnostic Dominance: Right   Patient Profile     84 y.o. 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 Transferred to [facility] for further evaluation.  Assessment & Plan    1. NSTEMI: HS troponin 132--> 499--> 705. ECG with chronic LBBB and no acute ST/TW changes. Underwent cardiac cath noted above with severe multivessel disease. TCTS consulted with plans for CABG tomorrow. P2Y12 210 today. - Plavix stopped (4/12), Imdur BB, hydralazine and statin.  Allergy to ASA.   2. HTN: improved this morning. - Norvasc increased to 10mg  daily, increased Coreg to 25mg  BID and added hydralazine 25mg  TID yesterday   3. HLD: 10/10/2019: Cholesterol 148; HDL 30; LDL Cholesterol 96; Triglycerides 110; VLDL 22 - Continue Lipitor 80mg  qd   4. CKD II: - Scr stable at 1.3 today - daily BMET   5. Wheezing: improved, continue nebs.   For questions or updates, please contact [place] Please consult [email or web address] for contact info under    Signed, Reino Bellis, NP  10/13/2019, 9:02 AM    Agree with note by Reino Bellis NP-C  Stable for CABG tomorrow morning.  Continue 12 test was 210 suggesting he has had adequate Plavix washout.  Been asymptomatic.  Exam is benign.  Lorretta Harp, M.D., [place], [facility], [place]verta [place]  [place] [address] [place], [place]  [address]  [phone] 10/13/2019 11:41 AM

## 2019-10-13 NOTE — Progress Notes (Signed)
[place] for Heparin Indication: chest pain/ACS  Allergies  Allergen Reactions  . Aspirin Anaphylaxis  . Other     Foods that are high in acid content cause sores and a rash.   . Penicillins Nausea And Vomiting    Patient Measurements: Height: 5\' 8"  (172.7 cm) Weight: 105.7 kg (233 lb 1.6 oz) IBW/kg (Calculated) : 68.4 Heparin Dosing Weight: 90 kg  Vital Signs: Temp: 98.2 F (36.8 C) (04/15 0318) Temp Source: Oral (04/15 0318) BP: 174/75 (04/15 1051) Pulse Rate: 67 (04/15 1051)  Labs: Recent Labs    10/11/19 0640 10/11/19 0640 10/12/19 0435 10/13/19 0549  HGB 11.0*   < > 11.3* 11.2*  HCT 34.7*  --  35.1* 34.5*  PLT 195  --  192 185  HEPARINUNFRC 0.55  --  0.36 0.38  CREATININE 1.30*  --  1.32* 1.37*   < > = values in this interval not displayed.    Estimated Creatinine Clearance: 49.8 mL/min (A) (by C-G formula based on SCr of 1.37 mg/dL (H)).   Medical History: Past Medical History:  Diagnosis Date  . Anaphylaxis    Aspirin  . Coronary atherosclerosis of native coronary artery    a. BMS to RCA and LCx 10/09, DES to D1 and RCA 04/2014  . Essential hypertension   . Hyperlipidemia   . Myocardial infarction ([place]) 1998  . NSTEMI (non-ST elevated myocardial infarction) ([place]) 03/2008  . Seasonal allergies     Assessment: 84 y.o. male with chest pain on heparin for NSTEMI. He is s/p cath with plans for CABG on Friday Off clopidogrel P2Y12 level 212  -heparin level 0.38 at goal on heparin drip rate 950 uts/hr  -hg= 11 stable pltc stable 180-190 no bleeding (s/p slight bleeding at cath site - wrist -resolved)   Goal of Therapy:  Heparin level 0.3-0.7 units/ml Monitor platelets by anticoagulation protocol: Yes   Plan:  -continue heparin drip at current rate 950 uts/hr  -daily heparin level and CBC -follow up after cath   [place].D. CPP, BCPS Clinical Pharmacist [phone] 10/13/2019 1:56  PM    **Pharmacist phone directory can now be found on [email or web address] ([email or web address]).  Listed under [place].

## 2019-10-14 ENCOUNTER — Inpatient Hospital Stay (HOSPITAL_COMMUNITY): Payer: Medicare HMO

## 2019-10-14 ENCOUNTER — Inpatient Hospital Stay (HOSPITAL_COMMUNITY)
Admission: EM | Disposition: A | Discharge status: Home Health | Payer: Self-pay | Source: Home / Self Care | Attending: Cardiovascular Disease

## 2019-10-14 ENCOUNTER — Inpatient Hospital Stay (HOSPITAL_COMMUNITY): Payer: Medicare HMO | Admitting: Anesthesiology

## 2019-10-14 DIAGNOSIS — I2510 Atherosclerotic heart disease of native coronary artery without angina pectoris: Secondary | ICD-10-CM | POA: Diagnosis present

## 2019-10-14 DIAGNOSIS — Z951 Presence of aortocoronary bypass graft: Secondary | ICD-10-CM

## 2019-10-14 HISTORY — PX: ENDOVEIN HARVEST OF GREATER SAPHENOUS VEIN: SHX5059

## 2019-10-14 HISTORY — PX: TEE WITHOUT CARDIOVERSION: SHX5443

## 2019-10-14 HISTORY — PX: CORONARY ARTERY BYPASS GRAFT: SHX141

## 2019-10-14 LAB — BASIC METABOLIC PANEL
Anion gap: 11 (ref 5–15)
Anion gap: 7 (ref 5–15)
BUN: 16 mg/dL (ref 8–23)
BUN: 14 mg/dL (ref 8–23)
CO2: 20 mmol/L — ABNORMAL LOW (ref 22–32)
CO2: 22 mmol/L (ref 22–32)
Calcium: 9.2 mg/dL (ref 8.9–10.3)
Calcium: 7.8 mg/dL — ABNORMAL LOW (ref 8.9–10.3)
Chloride: 108 mmol/L (ref 98–111)
Chloride: 109 mmol/L (ref 98–111)
Creatinine, Ser: 1.26 mg/dL — ABNORMAL HIGH (ref 0.61–1.24)
Creatinine, Ser: 1.16 mg/dL (ref 0.61–1.24)
GFR calc Af Amer: >60 mL/min (ref >60)
GFR calc Af Amer: >60 mL/min (ref >60)
GFR calc non Af Amer: 53 mL/min — ABNORMAL LOW (ref >60)
GFR calc non Af Amer: 59 mL/min — ABNORMAL LOW (ref >60)
Glucose, Bld: 102 mg/dL — ABNORMAL HIGH (ref 70–99)
Glucose, Bld: 145 mg/dL — ABNORMAL HIGH (ref 70–99)
Potassium: 3.9 mmol/L (ref 3.5–5.1)
Potassium: 4.3 mmol/L (ref 3.5–5.1)
Sodium: 139 mmol/L (ref 135–145)
Sodium: 138 mmol/L (ref 135–145)

## 2019-10-14 LAB — CBC
HCT: 34.2 % — ABNORMAL LOW (ref 39.0–52.0)
HCT: 28.9 % — ABNORMAL LOW (ref 39.0–52.0)
HCT: 30.6 % — ABNORMAL LOW (ref 39.0–52.0)
Hemoglobin: 11.2 g/dL — ABNORMAL LOW (ref 13.0–17.0)
Hemoglobin: 9.3 g/dL — ABNORMAL LOW (ref 13.0–17.0)
Hemoglobin: 10 g/dL — ABNORMAL LOW (ref 13.0–17.0)
MCH: 27.1 pg (ref 26.0–34.0)
MCH: 26.8 pg (ref 26.0–34.0)
MCH: 27.2 pg (ref 26.0–34.0)
MCHC: 32.7 g/dL (ref 30.0–36.0)
MCHC: 32.2 g/dL (ref 30.0–36.0)
MCHC: 32.7 g/dL (ref 30.0–36.0)
MCV: 82.8 fL (ref 80.0–100.0)
MCV: 83.3 fL (ref 80.0–100.0)
MCV: 83.4 fL (ref 80.0–100.0)
Platelets: 182 10*3/uL (ref 150–400)
Platelets: 120 10*3/uL — ABNORMAL LOW (ref 150–400)
Platelets: 150 10*3/uL (ref 150–400)
RBC: 4.13 MIL/uL — ABNORMAL LOW (ref 4.22–5.81)
RBC: 3.47 MIL/uL — ABNORMAL LOW (ref 4.22–5.81)
RBC: 3.67 MIL/uL — ABNORMAL LOW (ref 4.22–5.81)
RDW: 17 % — ABNORMAL HIGH (ref 11.5–15.5)
RDW: 17.1 % — ABNORMAL HIGH (ref 11.5–15.5)
RDW: 17.1 % — ABNORMAL HIGH (ref 11.5–15.5)
WBC: 7.5 10*3/uL (ref 4.0–10.5)
WBC: 8.8 10*3/uL (ref 4.0–10.5)
WBC: 11 10*3/uL — ABNORMAL HIGH (ref 4.0–10.5)
nRBC: 0 % (ref 0.0–0.2)
nRBC: 0 % (ref 0.0–0.2)
nRBC: 0 % (ref 0.0–0.2)

## 2019-10-14 LAB — POCT I-STAT, CHEM 8
BUN: 16 mg/dL (ref 8–23)
BUN: 15 mg/dL (ref 8–23)
BUN: 14 mg/dL (ref 8–23)
BUN: 15 mg/dL (ref 8–23)
Calcium, Ion: 1.37 mmol/L (ref 1.15–1.40)
Calcium, Ion: 1.3 mmol/L (ref 1.15–1.40)
Calcium, Ion: 1.14 mmol/L — ABNORMAL LOW (ref 1.15–1.40)
Calcium, Ion: 1.16 mmol/L (ref 1.15–1.40)
Chloride: 104 mmol/L (ref 98–111)
Chloride: 105 mmol/L (ref 98–111)
Chloride: 102 mmol/L (ref 98–111)
Chloride: 103 mmol/L (ref 98–111)
Creatinine, Ser: 1.2 mg/dL (ref 0.61–1.24)
Creatinine, Ser: 1.1 mg/dL (ref 0.61–1.24)
Creatinine, Ser: 1 mg/dL (ref 0.61–1.24)
Creatinine, Ser: 1 mg/dL (ref 0.61–1.24)
Glucose, Bld: 106 mg/dL — ABNORMAL HIGH (ref 70–99)
Glucose, Bld: 107 mg/dL — ABNORMAL HIGH (ref 70–99)
Glucose, Bld: 116 mg/dL — ABNORMAL HIGH (ref 70–99)
Glucose, Bld: 128 mg/dL — ABNORMAL HIGH (ref 70–99)
HCT: 30 % — ABNORMAL LOW (ref 39.0–52.0)
HCT: 34 % — ABNORMAL LOW (ref 39.0–52.0)
HCT: 24 % — ABNORMAL LOW (ref 39.0–52.0)
HCT: 25 % — ABNORMAL LOW (ref 39.0–52.0)
Hemoglobin: 10.2 g/dL — ABNORMAL LOW (ref 13.0–17.0)
Hemoglobin: 11.6 g/dL — ABNORMAL LOW (ref 13.0–17.0)
Hemoglobin: 8.2 g/dL — ABNORMAL LOW (ref 13.0–17.0)
Hemoglobin: 8.5 g/dL — ABNORMAL LOW (ref 13.0–17.0)
Potassium: 3.8 mmol/L (ref 3.5–5.1)
Potassium: 4 mmol/L (ref 3.5–5.1)
Potassium: 4.6 mmol/L (ref 3.5–5.1)
Potassium: 4.8 mmol/L (ref 3.5–5.1)
Sodium: 140 mmol/L (ref 135–145)
Sodium: 139 mmol/L (ref 135–145)
Sodium: 136 mmol/L (ref 135–145)
Sodium: 137 mmol/L (ref 135–145)
TCO2: 31 mmol/L (ref 22–32)
TCO2: 26 mmol/L (ref 22–32)
TCO2: 24 mmol/L (ref 22–32)
TCO2: 28 mmol/L (ref 22–32)

## 2019-10-14 LAB — POCT I-STAT 7, (LYTES, BLD GAS, ICA,H+H)
Acid-Base Excess: 1 mmol/L (ref 0.0–2.0)
Acid-base deficit: 1 mmol/L (ref 0.0–2.0)
Acid-base deficit: 1 mmol/L (ref 0.0–2.0)
Acid-base deficit: 4 mmol/L — ABNORMAL HIGH (ref 0.0–2.0)
Bicarbonate: 24.1 mmol/L (ref 20.0–28.0)
Bicarbonate: 26.6 mmol/L (ref 20.0–28.0)
Bicarbonate: 23 mmol/L (ref 20.0–28.0)
Bicarbonate: 21.4 mmol/L (ref 20.0–28.0)
Calcium, Ion: 1.32 mmol/L (ref 1.15–1.40)
Calcium, Ion: 1.07 mmol/L — ABNORMAL LOW (ref 1.15–1.40)
Calcium, Ion: 1.16 mmol/L (ref 1.15–1.40)
Calcium, Ion: 1.14 mmol/L — ABNORMAL LOW (ref 1.15–1.40)
HCT: 33 % — ABNORMAL LOW (ref 39.0–52.0)
HCT: 28 % — ABNORMAL LOW (ref 39.0–52.0)
HCT: 27 % — ABNORMAL LOW (ref 39.0–52.0)
HCT: 36 % — ABNORMAL LOW (ref 39.0–52.0)
Hemoglobin: 11.2 g/dL — ABNORMAL LOW (ref 13.0–17.0)
Hemoglobin: 9.5 g/dL — ABNORMAL LOW (ref 13.0–17.0)
Hemoglobin: 9.2 g/dL — ABNORMAL LOW (ref 13.0–17.0)
Hemoglobin: 12.2 g/dL — ABNORMAL LOW (ref 13.0–17.0)
O2 Saturation: 100 %
O2 Saturation: 100 %
O2 Saturation: 96 %
O2 Saturation: 95 %
Potassium: 3.8 mmol/L (ref 3.5–5.1)
Potassium: 4.2 mmol/L (ref 3.5–5.1)
Potassium: 4.7 mmol/L (ref 3.5–5.1)
Potassium: 4 mmol/L (ref 3.5–5.1)
Sodium: 140 mmol/L (ref 135–145)
Sodium: 140 mmol/L (ref 135–145)
Sodium: 137 mmol/L (ref 135–145)
Sodium: 141 mmol/L (ref 135–145)
TCO2: 25 mmol/L (ref 22–32)
TCO2: 28 mmol/L (ref 22–32)
TCO2: 24 mmol/L (ref 22–32)
TCO2: 23 mmol/L (ref 22–32)
pCO2 arterial: 42.1 mmHg (ref 32.0–48.0)
pCO2 arterial: 48 mmHg (ref 32.0–48.0)
pCO2 arterial: 35.3 mmHg (ref 32.0–48.0)
pCO2 arterial: 39 mmHg (ref 32.0–48.0)
pH, Arterial: 7.366 (ref 7.350–7.450)
pH, Arterial: 7.352 (ref 7.350–7.450)
pH, Arterial: 7.422 (ref 7.350–7.450)
pH, Arterial: 7.348 — ABNORMAL LOW (ref 7.350–7.450)
pO2, Arterial: 301 mmHg — ABNORMAL HIGH (ref 83.0–108.0)
pO2, Arterial: 428 mmHg — ABNORMAL HIGH (ref 83.0–108.0)
pO2, Arterial: 81 mmHg — ABNORMAL LOW (ref 83.0–108.0)
pO2, Arterial: 77 mmHg — ABNORMAL LOW (ref 83.0–108.0)

## 2019-10-14 LAB — PROTIME-INR
INR: 1.3 — ABNORMAL HIGH (ref 0.8–1.2)
Prothrombin Time: 16.1 seconds — ABNORMAL HIGH (ref 11.4–15.2)

## 2019-10-14 LAB — GLUCOSE, CAPILLARY
Glucose-Capillary: 135 mg/dL — ABNORMAL HIGH (ref 70–99)
Glucose-Capillary: 132 mg/dL — ABNORMAL HIGH (ref 70–99)
Glucose-Capillary: 119 mg/dL — ABNORMAL HIGH (ref 70–99)
Glucose-Capillary: 150 mg/dL — ABNORMAL HIGH (ref 70–99)

## 2019-10-14 LAB — HEMOGLOBIN AND HEMATOCRIT, BLOOD
HCT: 22 % — ABNORMAL LOW (ref 39.0–52.0)
Hemoglobin: 7.3 g/dL — ABNORMAL LOW (ref 13.0–17.0)

## 2019-10-14 LAB — HEPARIN LEVEL (UNFRACTIONATED): Heparin Unfractionated: 0.34 IU/mL (ref 0.30–0.70)

## 2019-10-14 LAB — MAGNESIUM: Magnesium: 2.5 mg/dL — ABNORMAL HIGH (ref 1.7–2.4)

## 2019-10-14 LAB — APTT: aPTT: 30 seconds (ref 24–36)

## 2019-10-14 LAB — PLATELET COUNT: Platelets: 122 10*3/uL — ABNORMAL LOW (ref 150–400)

## 2019-10-14 SURGERY — CORONARY ARTERY BYPASS GRAFTING (CABG)
Anesthesia: General | Site: Leg Upper | Laterality: Right

## 2019-10-14 MED ORDER — LEVOFLOXACIN IN D5W 750 MG/150ML IV SOLN
750.0000 mg | INTRAVENOUS | Status: AC
  Administered 2019-10-15: 10:00:00 750 mg via INTRAVENOUS
  Filled 2019-10-14: qty 150

## 2019-10-14 MED ORDER — SODIUM CHLORIDE 0.45 % IV SOLN: INTRAVENOUS | Status: DC | PRN

## 2019-10-14 MED ORDER — ACETAMINOPHEN 650 MG RE SUPP
650.0000 mg | Freq: Once | RECTAL | Status: AC
  Administered 2019-10-14: 13:00:00 650 mg via RECTAL

## 2019-10-14 MED ORDER — PLASMA-LYTE 148 IV SOLN
INTRAVENOUS | Status: DC | PRN
  Administered 2019-10-14: 09:00:00 500 mL via INTRAVASCULAR

## 2019-10-14 MED ORDER — THROMBIN 20000 UNITS EX SOLR
CUTANEOUS | Status: DC | PRN
  Administered 2019-10-14: 20000 [IU] via TOPICAL

## 2019-10-14 MED ORDER — POTASSIUM CHLORIDE 10 MEQ/50ML IV SOLN: 10.0000 meq | INTRAVENOUS | Status: AC

## 2019-10-14 MED ORDER — TRAMADOL HCL 50 MG PO TABS: 50.0000 mg | ORAL_TABLET | Freq: Four times a day (QID) | ORAL | Status: DC | PRN

## 2019-10-14 MED ORDER — HEMOSTATIC AGENTS (NO CHARGE) OPTIME
TOPICAL | Status: DC | PRN
  Administered 2019-10-14: 1 via TOPICAL

## 2019-10-14 MED ORDER — PROPOFOL 10 MG/ML IV BOLUS
INTRAVENOUS | Status: AC
  Filled 2019-10-14: qty 20

## 2019-10-14 MED ORDER — PHENYLEPHRINE HCL-NACL 10-0.9 MG/250ML-% IV SOLN
INTRAVENOUS | Status: DC | PRN
  Administered 2019-10-14: 50 ug/min via INTRAVENOUS

## 2019-10-14 MED ORDER — DEXAMETHASONE SODIUM PHOSPHATE 10 MG/ML IJ SOLN
INTRAMUSCULAR | Status: DC | PRN
  Administered 2019-10-14: 10 mg via INTRAVENOUS

## 2019-10-14 MED ORDER — FAMOTIDINE IN NACL 20-0.9 MG/50ML-% IV SOLN
20.0000 mg | Freq: Two times a day (BID) | INTRAVENOUS | Status: AC
  Administered 2019-10-14: 13:00:00 20 mg via INTRAVENOUS

## 2019-10-14 MED ORDER — LACTATED RINGERS IV SOLN: INTRAVENOUS | Status: DC | PRN

## 2019-10-14 MED ORDER — ACETAMINOPHEN 160 MG/5ML PO SOLN: 650.0000 mg | Freq: Once | ORAL | Status: AC

## 2019-10-14 MED ORDER — ONDANSETRON HCL 4 MG/2ML IJ SOLN
INTRAMUSCULAR | Status: AC
  Filled 2019-10-14: qty 2

## 2019-10-14 MED ORDER — ACETAMINOPHEN 500 MG PO TABS
1000.0000 mg | ORAL_TABLET | Freq: Four times a day (QID) | ORAL | Status: DC
  Administered 2019-10-15 – 2019-10-19 (×15): 1000 mg via ORAL
  Filled 2019-10-14 (×15): qty 2

## 2019-10-14 MED ORDER — LACTATED RINGERS IV SOLN: 500.0000 mL | Freq: Once | INTRAVENOUS | Status: DC | PRN

## 2019-10-14 MED ORDER — MAGNESIUM SULFATE 4 GM/100ML IV SOLN
4.0000 g | Freq: Once | INTRAVENOUS | Status: AC
  Administered 2019-10-14: 4 g via INTRAVENOUS
  Filled 2019-10-14: qty 100

## 2019-10-14 MED ORDER — PHENYLEPHRINE 40 MCG/ML (10ML) SYRINGE FOR IV PUSH (FOR BLOOD PRESSURE SUPPORT)
PREFILLED_SYRINGE | INTRAVENOUS | Status: AC
  Filled 2019-10-14: qty 10

## 2019-10-14 MED ORDER — CHLORHEXIDINE GLUCONATE 0.12 % MT SOLN
15.0000 mL | OROMUCOSAL | Status: AC
  Administered 2019-10-14: 13:00:00 15 mL via OROMUCOSAL

## 2019-10-14 MED ORDER — METOPROLOL TARTRATE 12.5 MG HALF TABLET
12.5000 mg | ORAL_TABLET | Freq: Two times a day (BID) | ORAL | Status: DC
  Administered 2019-10-15 – 2019-10-16 (×3): 12.5 mg via ORAL
  Filled 2019-10-14 (×3): qty 1

## 2019-10-14 MED ORDER — INSULIN REGULAR(HUMAN) IN NACL 100-0.9 UT/100ML-% IV SOLN: INTRAVENOUS | Status: DC

## 2019-10-14 MED ORDER — BISACODYL 10 MG RE SUPP: 10.0000 mg | Freq: Every day | RECTAL | Status: DC

## 2019-10-14 MED ORDER — 0.9 % SODIUM CHLORIDE (POUR BTL) OPTIME
TOPICAL | Status: DC | PRN
  Administered 2019-10-14: 09:00:00 5000 mL

## 2019-10-14 MED ORDER — ARTIFICIAL TEARS OPHTHALMIC OINT
TOPICAL_OINTMENT | OPHTHALMIC | Status: DC | PRN
  Administered 2019-10-14: 1 via OPHTHALMIC

## 2019-10-14 MED ORDER — THROMBIN 20000 UNITS EX SOLR
OROMUCOSAL | Status: DC | PRN
  Administered 2019-10-14 (×3): 4 mL via TOPICAL

## 2019-10-14 MED ORDER — DOCUSATE SODIUM 100 MG PO CAPS
200.0000 mg | ORAL_CAPSULE | Freq: Every day | ORAL | Status: DC
  Administered 2019-10-15 – 2019-10-19 (×4): 200 mg via ORAL
  Filled 2019-10-14 (×4): qty 2

## 2019-10-14 MED ORDER — SUCCINYLCHOLINE CHLORIDE 200 MG/10ML IV SOSY
PREFILLED_SYRINGE | INTRAVENOUS | Status: AC
  Filled 2019-10-14: qty 10

## 2019-10-14 MED ORDER — PANTOPRAZOLE SODIUM 40 MG PO TBEC
40.0000 mg | DELAYED_RELEASE_TABLET | Freq: Every day | ORAL | Status: DC
  Administered 2019-10-16 – 2019-10-19 (×4): 40 mg via ORAL
  Filled 2019-10-14 (×4): qty 1

## 2019-10-14 MED ORDER — THROMBIN (RECOMBINANT) 20000 UNITS EX SOLR
CUTANEOUS | Status: AC
  Filled 2019-10-14: qty 20000

## 2019-10-14 MED ORDER — SODIUM CHLORIDE 0.9 % IV SOLN: INTRAVENOUS | Status: DC

## 2019-10-14 MED ORDER — HEPARIN SODIUM (PORCINE) 1000 UNIT/ML IJ SOLN
INTRAMUSCULAR | Status: DC | PRN
  Administered 2019-10-14: 35000 [IU] via INTRAVENOUS

## 2019-10-14 MED ORDER — FENTANYL CITRATE (PF) 250 MCG/5ML IJ SOLN
INTRAMUSCULAR | Status: AC
  Filled 2019-10-14: qty 25

## 2019-10-14 MED ORDER — LACTATED RINGERS IV SOLN: INTRAVENOUS | Status: DC

## 2019-10-14 MED ORDER — MIDAZOLAM HCL 2 MG/2ML IJ SOLN: 2.0000 mg | INTRAMUSCULAR | Status: DC | PRN

## 2019-10-14 MED ORDER — METOCLOPRAMIDE HCL 5 MG/ML IJ SOLN
10.0000 mg | Freq: Four times a day (QID) | INTRAMUSCULAR | Status: DC
  Administered 2019-10-14 – 2019-10-19 (×19): 10 mg via INTRAVENOUS
  Filled 2019-10-14 (×16): qty 2

## 2019-10-14 MED ORDER — FENTANYL CITRATE (PF) 250 MCG/5ML IJ SOLN
INTRAMUSCULAR | Status: DC | PRN
  Administered 2019-10-14: 200 ug via INTRAVENOUS
  Administered 2019-10-14: 250 ug via INTRAVENOUS
  Administered 2019-10-14: 200 ug via INTRAVENOUS
  Administered 2019-10-14: 250 ug via INTRAVENOUS
  Administered 2019-10-14: 100 ug via INTRAVENOUS
  Administered 2019-10-14: 200 ug via INTRAVENOUS
  Administered 2019-10-14: 50 ug via INTRAVENOUS

## 2019-10-14 MED ORDER — DEXTROSE 50 % IV SOLN: 0.0000 mL | INTRAVENOUS | Status: DC | PRN

## 2019-10-14 MED ORDER — DEXAMETHASONE SODIUM PHOSPHATE 10 MG/ML IJ SOLN
INTRAMUSCULAR | Status: AC
  Filled 2019-10-14: qty 1

## 2019-10-14 MED ORDER — HYDRALAZINE HCL 20 MG/ML IJ SOLN
10.0000 mg | INTRAMUSCULAR | Status: DC | PRN
  Administered 2019-10-15 (×2): 10 mg via INTRAVENOUS
  Filled 2019-10-14 (×4): qty 1

## 2019-10-14 MED ORDER — SODIUM BICARBONATE 8.4 % IV SOLN
50.0000 meq | Freq: Once | INTRAVENOUS | Status: AC
  Administered 2019-10-14: 50 meq via INTRAVENOUS

## 2019-10-14 MED ORDER — SODIUM CHLORIDE 0.9% FLUSH
3.0000 mL | Freq: Two times a day (BID) | INTRAVENOUS | Status: DC
  Administered 2019-10-15 – 2019-10-18 (×7): 3 mL via INTRAVENOUS

## 2019-10-14 MED ORDER — CHLORHEXIDINE GLUCONATE CLOTH 2 % EX PADS
6.0000 | MEDICATED_PAD | Freq: Every day | CUTANEOUS | Status: DC
  Administered 2019-10-14 – 2019-10-19 (×5): 6 via TOPICAL

## 2019-10-14 MED ORDER — SODIUM CHLORIDE 0.9 % IV SOLN: 250.0000 mL | INTRAVENOUS | Status: DC

## 2019-10-14 MED ORDER — NITROGLYCERIN IN D5W 200-5 MCG/ML-% IV SOLN
0.0000 ug/min | INTRAVENOUS | Status: DC
  Administered 2019-10-14: 16:00:00 40 ug/min via INTRAVENOUS
  Filled 2019-10-14: qty 250

## 2019-10-14 MED ORDER — MIDAZOLAM HCL 2 MG/2ML IJ SOLN
INTRAMUSCULAR | Status: AC
  Filled 2019-10-14: qty 2

## 2019-10-14 MED ORDER — METOPROLOL TARTRATE 5 MG/5ML IV SOLN
2.5000 mg | INTRAVENOUS | Status: DC | PRN
  Administered 2019-10-15: 13:00:00 5 mg via INTRAVENOUS
  Filled 2019-10-14: qty 5

## 2019-10-14 MED ORDER — MIDAZOLAM HCL 5 MG/5ML IJ SOLN
INTRAMUSCULAR | Status: DC | PRN
  Administered 2019-10-14 (×2): 2 mg via INTRAVENOUS
  Administered 2019-10-14 (×2): 1 mg via INTRAVENOUS
  Administered 2019-10-14 (×3): 2 mg via INTRAVENOUS

## 2019-10-14 MED ORDER — METOPROLOL TARTRATE 25 MG/10 ML ORAL SUSPENSION: 12.5000 mg | Freq: Two times a day (BID) | ORAL | Status: DC

## 2019-10-14 MED ORDER — SODIUM CHLORIDE 0.9% FLUSH: 3.0000 mL | INTRAVENOUS | Status: DC | PRN

## 2019-10-14 MED ORDER — BISACODYL 5 MG PO TBEC
10.0000 mg | DELAYED_RELEASE_TABLET | Freq: Every day | ORAL | Status: DC
  Administered 2019-10-15 – 2019-10-19 (×4): 10 mg via ORAL
  Filled 2019-10-14 (×4): qty 2

## 2019-10-14 MED ORDER — ROCURONIUM BROMIDE 10 MG/ML (PF) SYRINGE
PREFILLED_SYRINGE | INTRAVENOUS | Status: AC
  Filled 2019-10-14: qty 10

## 2019-10-14 MED ORDER — ALBUMIN HUMAN 5 % IV SOLN
250.0000 mL | INTRAVENOUS | Status: AC | PRN
  Administered 2019-10-14: 12.5 g via INTRAVENOUS
  Filled 2019-10-14: qty 250

## 2019-10-14 MED ORDER — ONDANSETRON HCL 4 MG/2ML IJ SOLN
4.0000 mg | Freq: Four times a day (QID) | INTRAMUSCULAR | Status: DC | PRN
  Administered 2019-10-14: 4 mg via INTRAVENOUS
  Filled 2019-10-14: qty 2

## 2019-10-14 MED ORDER — METHYLPREDNISOLONE SODIUM SUCC 125 MG IJ SOLR
80.0000 mg | Freq: Once | INTRAMUSCULAR | Status: AC
  Administered 2019-10-14: 18:00:00 80 mg via INTRAVENOUS
  Filled 2019-10-14: qty 2

## 2019-10-14 MED ORDER — OXYCODONE HCL 5 MG PO TABS
5.0000 mg | ORAL_TABLET | ORAL | Status: DC | PRN
  Administered 2019-10-15 – 2019-10-16 (×2): 5 mg via ORAL
  Filled 2019-10-14 (×2): qty 1

## 2019-10-14 MED ORDER — DEXMEDETOMIDINE HCL IN NACL 400 MCG/100ML IV SOLN
0.0000 ug/kg/h | INTRAVENOUS | Status: DC
  Filled 2019-10-14: qty 100

## 2019-10-14 MED ORDER — ROCURONIUM BROMIDE 100 MG/10ML IV SOLN
INTRAVENOUS | Status: DC | PRN
  Administered 2019-10-14 (×2): 30 mg via INTRAVENOUS
  Administered 2019-10-14 (×2): 20 mg via INTRAVENOUS
  Administered 2019-10-14: 40 mg via INTRAVENOUS
  Administered 2019-10-14: 20 mg via INTRAVENOUS
  Administered 2019-10-14: 60 mg via INTRAVENOUS

## 2019-10-14 MED ORDER — MORPHINE SULFATE (PF) 2 MG/ML IV SOLN
1.0000 mg | INTRAVENOUS | Status: DC | PRN
  Administered 2019-10-14 – 2019-10-15 (×6): 2 mg via INTRAVENOUS
  Filled 2019-10-14 (×6): qty 1

## 2019-10-14 MED ORDER — MIDAZOLAM HCL (PF) 10 MG/2ML IJ SOLN
INTRAMUSCULAR | Status: AC
  Filled 2019-10-14: qty 2

## 2019-10-14 MED ORDER — PROPOFOL 10 MG/ML IV BOLUS
INTRAVENOUS | Status: DC | PRN
  Administered 2019-10-14 (×3): 10 mg via INTRAVENOUS
  Administered 2019-10-14: 20 mg via INTRAVENOUS
  Administered 2019-10-14: 10 mg via INTRAVENOUS
  Administered 2019-10-14: 140 mg via INTRAVENOUS

## 2019-10-14 MED ORDER — PROTAMINE SULFATE 10 MG/ML IV SOLN
INTRAVENOUS | Status: DC | PRN
  Administered 2019-10-14: 100 mg via INTRAVENOUS
  Administered 2019-10-14: 30 mg via INTRAVENOUS
  Administered 2019-10-14 (×2): 50 mg via INTRAVENOUS
  Administered 2019-10-14: 100 mg via INTRAVENOUS
  Administered 2019-10-14: 20 mg via INTRAVENOUS

## 2019-10-14 MED ORDER — VANCOMYCIN HCL IN DEXTROSE 1-5 GM/200ML-% IV SOLN
1000.0000 mg | Freq: Once | INTRAVENOUS | Status: AC
  Administered 2019-10-14: 21:00:00 1000 mg via INTRAVENOUS
  Filled 2019-10-14: qty 200

## 2019-10-14 MED ORDER — ALBUMIN HUMAN 5 % IV SOLN: INTRAVENOUS | Status: DC | PRN

## 2019-10-14 MED ORDER — PHENYLEPHRINE HCL-NACL 20-0.9 MG/250ML-% IV SOLN: 0.0000 ug/min | INTRAVENOUS | Status: DC

## 2019-10-14 MED ORDER — ACETAMINOPHEN 160 MG/5ML PO SOLN: 1000.0000 mg | Freq: Four times a day (QID) | ORAL | Status: DC

## 2019-10-14 MED ORDER — LIDOCAINE 2% (20 MG/ML) 5 ML SYRINGE
INTRAMUSCULAR | Status: AC
  Filled 2019-10-14: qty 5

## 2019-10-14 SURGICAL SUPPLY — 75 items
BAG DECANTER FOR FLEXI CONT (MISCELLANEOUS) ×4 IMPLANT
BASKET HEART (ORDER IN 25'S) (MISCELLANEOUS) ×4
BASKET HEART (ORDER IN 25S) (MISCELLANEOUS) ×3 IMPLANT
BLADE CLIPPER SURG (BLADE) ×4 IMPLANT
BLADE STERNUM SYSTEM 6 (BLADE) ×4 IMPLANT
BLADE SURG 11 STRL SS (BLADE) ×4 IMPLANT
BNDG CMPR MED 10X6 ELC LF (GAUZE/BANDAGES/DRESSINGS) ×3
BNDG ELASTIC 4X5.8 VLCR STR LF (GAUZE/BANDAGES/DRESSINGS) ×4 IMPLANT
BNDG ELASTIC 6X10 VLCR STRL LF (GAUZE/BANDAGES/DRESSINGS) ×4 IMPLANT
BNDG ELASTIC 6X5.8 VLCR STR LF (GAUZE/BANDAGES/DRESSINGS) ×4 IMPLANT
BNDG GAUZE ELAST 4 BULKY (GAUZE/BANDAGES/DRESSINGS) ×4 IMPLANT
CANISTER SUCT 3000ML PPV (MISCELLANEOUS) ×4 IMPLANT
CATH ROBINSON RED A/P 18FR (CATHETERS) ×8 IMPLANT
CATH THORACIC 28FR (CATHETERS) ×4 IMPLANT
CATH THORACIC 36FR (CATHETERS) ×4 IMPLANT
CATH THORACIC 36FR RT ANG (CATHETERS) ×4 IMPLANT
CLIP VESOCCLUDE MED 24/CT (CLIP) IMPLANT
CLIP VESOCCLUDE SM WIDE 24/CT (CLIP) ×3 IMPLANT
DRAPE CARDIOVASCULAR INCISE (DRAPES) ×4
DRAPE SLUSH/WARMER DISC (DRAPES) ×4 IMPLANT
DRAPE SRG 135X102X78XABS (DRAPES) ×3 IMPLANT
DRSG COVADERM 4X14 (GAUZE/BANDAGES/DRESSINGS) ×4 IMPLANT
ELECT BLADE 4.0 EZ CLEAN MEGAD (MISCELLANEOUS) ×4
ELECT CAUTERY BLADE 6.4 (BLADE) ×4 IMPLANT
ELECT REM PT RETURN 9FT ADLT (ELECTROSURGICAL) ×8
ELECTRODE BLDE 4.0 EZ CLN MEGD (MISCELLANEOUS) ×3 IMPLANT
ELECTRODE REM PT RTRN 9FT ADLT (ELECTROSURGICAL) ×6 IMPLANT
FELT TEFLON 1X6 (MISCELLANEOUS) ×4 IMPLANT
GAUZE SPONGE 4X4 12PLY STRL (GAUZE/BANDAGES/DRESSINGS) ×8 IMPLANT
GLOVE BIO SURGEON STRL SZ 6.5 (GLOVE) ×12 IMPLANT
GLOVE BIOGEL M 6.5 STRL (GLOVE) ×8 IMPLANT
GLOVE BIOGEL PI IND STRL 6 (GLOVE) ×6 IMPLANT
GLOVE BIOGEL PI INDICATOR 6 (GLOVE) ×3
GLOVE SS BIOGEL STRL SZ 7 (GLOVE) ×6 IMPLANT
GLOVE SUPERSENSE BIOGEL SZ 7 (GLOVE) ×2
GOWN STRL REUS W/ TWL LRG LVL3 (GOWN DISPOSABLE) ×18 IMPLANT
GOWN STRL REUS W/ TWL XL LVL3 (GOWN DISPOSABLE) ×3 IMPLANT
GOWN STRL REUS W/TWL LRG LVL3 (GOWN DISPOSABLE) ×24
GOWN STRL REUS W/TWL XL LVL3 (GOWN DISPOSABLE) ×4
HEMOSTAT POWDER SURGIFOAM 1G (HEMOSTASIS) ×12 IMPLANT
HEMOSTAT SURGICEL 2X14 (HEMOSTASIS) ×4 IMPLANT
KIT BASIN OR (CUSTOM PROCEDURE TRAY) ×4 IMPLANT
KIT CATH CPB BARTLE (MISCELLANEOUS) ×4 IMPLANT
KIT SUCTION CATH 14FR (SUCTIONS) ×4 IMPLANT
KIT TURNOVER KIT B (KITS) ×4 IMPLANT
KIT VASOVIEW HEMOPRO 2 VH 4000 (KITS) ×4 IMPLANT
NS IRRIG 1000ML POUR BTL (IV SOLUTION) ×20 IMPLANT
PACK E OPEN HEART (SUTURE) ×4 IMPLANT
PACK OPEN HEART (CUSTOM PROCEDURE TRAY) ×4 IMPLANT
PAD ARMBOARD 7.5X6 YLW CONV (MISCELLANEOUS) ×8 IMPLANT
PAD ELECT DEFIB RADIOL ZOLL (MISCELLANEOUS) ×4 IMPLANT
PENCIL BUTTON HOLSTER BLD 10FT (ELECTRODE) ×7 IMPLANT
POSITIONER HEAD DONUT 9IN (MISCELLANEOUS) ×4 IMPLANT
PUNCH AORTIC ROTATE 4.5MM 8IN (MISCELLANEOUS) ×4 IMPLANT
SET CARDIOPLEGIA MPS 5001102 (MISCELLANEOUS) ×4 IMPLANT
SPONGE LAP 4X18 RFD (DISPOSABLE) ×4 IMPLANT
SUT BONE WAX W31G (SUTURE) ×4 IMPLANT
SUT PROLENE 3 0 SH1 36 (SUTURE) ×4 IMPLANT
SUT PROLENE 7 0 BV1 MDA (SUTURE) ×4 IMPLANT
SUT PROLENE 8 0 BV175 6 (SUTURE) ×4 IMPLANT
SUT STEEL SZ 6 DBL 3X14 BALL (SUTURE) ×18 IMPLANT
SUT VIC AB 1 CTX 36 (SUTURE) ×12
SUT VIC AB 1 CTX36XBRD ANBCTR (SUTURE) ×9 IMPLANT
SUT VIC AB 2-0 CT1 27 (SUTURE) ×4
SUT VIC AB 2-0 CT1 TAPERPNT 27 (SUTURE) ×3 IMPLANT
SUT VICRYL 4-0 PS2 18IN ABS (SUTURE) ×3 IMPLANT
SYSTEM SAHARA CHEST DRAIN ATS (WOUND CARE) ×4 IMPLANT
TAPE CLOTH SURG 4X10 WHT LF (GAUZE/BANDAGES/DRESSINGS) ×4 IMPLANT
TAPE PAPER 3X10 WHT MICROPORE (GAUZE/BANDAGES/DRESSINGS) ×4 IMPLANT
TOWEL GREEN STERILE (TOWEL DISPOSABLE) ×4 IMPLANT
TOWEL GREEN STERILE FF (TOWEL DISPOSABLE) ×4 IMPLANT
TRAY FOLEY SLVR 16FR TEMP STAT (SET/KITS/TRAYS/PACK) ×4 IMPLANT
TUBING LAP HI FLOW INSUFFLATIO (TUBING) ×4 IMPLANT
UNDERPAD 30X30 (UNDERPADS AND DIAPERS) ×4 IMPLANT
WATER STERILE IRR 1000ML POUR (IV SOLUTION) ×8 IMPLANT

## 2019-10-14 NOTE — Transfer of Care (Signed)
Immediate Anesthesia Transfer of Care Note  Patient: Corey [name]  Procedure(s) Performed: CORONARY ARTERY BYPASS GRAFTING (CABG) x 2 (N/A Chest) TRANSESOPHAGEAL ECHOCARDIOGRAM (TEE) (N/A ) Endovein Harvest Of Greater Saphenous Vein (Right Leg Upper)  Patient Location: SICU  Anesthesia Type:Regional  Level of Consciousness: Patient remains intubated per anesthesia plan  Airway & Oxygen Therapy: Patient remains intubated per anesthesia plan and Patient placed on Ventilator (see vital sign flow sheet for setting)  Post-op Assessment: Post -op Vital signs reviewed and stable  Post vital signs: stable  Last Vitals:  Vitals Value Taken Time  BP    Temp    Pulse    Resp    SpO2      Last Pain:  Vitals:   10/14/19 0318  TempSrc: Oral  PainSc:       Patients Stated Pain Goal: 0 ([id number])  Complications: No apparent anesthesia complications

## 2019-10-14 NOTE — Discharge Summary (Signed)
Physician Discharge Summary       [address]       [place] [address]             [phone]    Patient ID: Corey [name] MRN: [id number] DOB/AGE: [birth year] 84 y.o.  Admit date: 10/09/2019 Discharge date: 10/19/2019  Admission Diagnoses: 1. NSTEMI (non-ST elevated myocardial infarction) ([place]) 2. Coronary artery disease  Discharge Diagnoses:  1. S/P CABG x 2  2. 3. History of pure hypercholesterolemia 4. History of chronic combined systolic and diastolic heart failure ([name]) 5. History of essential hypertension 6. History of seasonal allergies 7. History of remote tobacco abuse 8. History of CKD (stage II)   Consults: None  Procedure (s):  1. Median Sternotomy 2. Extracorporeal circulation 3.   Coronary artery bypass grafting x 2   Left internal mammary artery graft to the LAD  SVG to diagonal   4.   Endoscopic vein harvest from the right leg by Dr. Cyndia Bent on 10/14/2019.  History of Presenting Illness: The patient is an 84 year old gentleman with a history of hypertension, hyperlipidemia, and coronary disease status post multiple PCI's.  He had a non-ST segment elevation MI in 2009 and underwent BMS to the RCA and staged PCI to the left circumflex.  04/2014 he underwent stenting of the first diagonal and mid RCA.  He said that he was doing fairly well until about 2 weeks ago when he had his first COVID-19 vaccine.  He said that since then he has had nightly abdominal discomfort and then had 10 out of 10 chest pain on the night of admission that felt like an elephant sitting on his chest.  This pain was similar to pain that he had with prior heart attacks.  His daughter took him to the [facility] emergency department on the morning of 10/09/2019.  His initial at HS troponin was 13.  Subsequent levels were 132, 499, and 705.  Electrocardiogram showed sinus rhythm with known left bundle branch block.  Cardiac catheterization today showed  90% ostial and proximal LAD stenosis followed by 75% stenosis at the bifurcation of the previously stented diagonal which was patent.  There is mild to moderate nonobstructive disease in the intermediate and RCA.  Previous stents in the diagonal, left circumflex, and RCA were patent.  Left ventricular ejection fraction was 45% with normal filling pressures.  Echocardiogram showed an ejection fraction of 45 to 50% with mild LVH.  There is grade 1 diastolic dysfunction.  There is no significant valvular abnormality.  The patient's daughter was conferenced in on her cell phone during my consultation.  The patient reports a several month history of fatigue and exertional shortness of breath which has been progressing.  He is to go to the [facility] regularly prior to the COVID-19 pandemic but has not been very active since over the past year.  He does walk around his neighborhood some.  He has not been able to walk even short distances without significant shortness of breath.  PCI is not recommended due to the anatomy and Dr. Cyndia Bent agreed that coronary bypass graft surgery to the LAD and diagonal branch is the best treatment for this gentleman.  Dr. Cyndia Bent discussed potential benefits, risks, and complications of the surgery with the patient and he agreed to proceed with surgery. He has been on Plavix which is now stopped.  A P2Y12 level was checked on Thursday and was 210. He underwent a CABG x 2 on 10/14/2019.  Brief Hospital Course:  The patient was extubated the evening of surgery without difficulty. He initially needed bi pap then was weaned to [facility].He remained afebrile and hemodynamically stable. Gordy Councilman, a line, chest tubes, and foley were removed early in the post operative course. Lopressor was started and titrated accordingly. He was volume over loaded and diuresed. He had ABL anemia. Hedid not require a post op transfusion. Last H and H was 8.7 and 26.9. He was started on oral Ferrous. He had mild  thrombocytopenia and his last platelets were up to 132,000. His creatinine did increase post op to 1.74 but as diuresis was decreased it did normalize. On 04/21, creatinine was down to 1.56. He will not be restarted on Lisinopril at this time. He was weaned off the insulin drip.  The patient's glucose remained well controlled.The patient's HGA1C pre op was 6.3. He likely has pre diabetes. Nutritional information will be provided with discharge paperwork. He will need to follow up with his medical doctor after discharge, which we discussed.  The patient was felt surgically stable for transfer from the ICU to PCTU for further convalescence on 04/19.  He continues to progress with cardiac rehab. He was initally requiring several liters of oxygen via [place] but was later ambulating on room air. He has been tolerating a diet and has had a bowel movement. Epicardial pacing wires were removed on 04/20. As discussed with Dr. Cyndia Bent, will resume Plavix as had NSTEMI upon admission and allergic to aspirin. Chest tube sutures will be removed in the office after discharge. As discussed with Dr. Cyndia Bent, the  patient is felt surgically stable for discharge today.   Latest Vital Signs: Blood pressure (!) 128/54, pulse 80, temperature 99 F (37.2 C), temperature source Oral, resp. rate (!) 21, height 5\' 8"  (1.727 m), weight 105.6 kg, SpO2 97 %.  Physical Exam: Cardiovascular: RRR Pulmonary: Clear to auscultation bilaterally Abdomen: Soft, non tender, bowel sounds present. Extremities: Trace bilateral lower extremity edema. Wounds: Clean and dry.  No erythema or signs of infection.  Discharge Condition: Stable and discharged to home.  Recent laboratory studies:  Lab Results  Component Value Date   WBC 8.6 10/18/2019   HGB 8.7 (L) 10/18/2019   HCT 26.9 (L) 10/18/2019   MCV 81.8 10/18/2019   PLT 132 (L) 10/18/2019   Lab Results  Component Value Date   NA 139 10/19/2019   K 4.0 10/19/2019   CL 102 10/19/2019    CO2 23 10/19/2019   CREATININE 1.56 (H) 10/19/2019   GLUCOSE 101 (H) 10/19/2019      Diagnostic Studies: DG Chest 2 View  Result Date: 10/18/2019 CLINICAL DATA:  Left base atelectasis. CABG on 10/14/2019. EXAM: CHEST - 2 VIEW COMPARISON:  10/17/2019 FINDINGS: The heart size and pulmonary vascularity are normal. Improved linear atelectasis at the left lung base. The lungs are otherwise clear. No pleural effusion. No pneumothorax. CABG. IMPRESSION: Improved now minimal atelectasis at the left lung base. Electronically Signed   By: Lorriane Shire M.D.   On: 10/18/2019 09:20   CARDIAC CATHETERIZATION  Result Date: 10/10/2019 Conclusions: 1. Severe single vessel coronary artery disease with 90% ostial/proximal LAD disease followed by 75% stenosis at the bifurcation with previously stented D1 ([place] 0,1,0). 2. Mild to moderate, non-obstructive disease involving ramus intermedius and proximal RCA (DFR 0.94). 3. Previously placed stents in D1, LCx, proximal RCA, and mid RCA are patent, with mild in-stent restenosis noted in the LCx and mid RCA. 4. Mildly to moderately  reduced left ventricular contraction (LVEF ~45%) with normal filling pressure. Recommendations: 1. Images were reviewed with Dr. Gwenlyn Found.  We have agreed that surgical revascularization may be preferential for treatment of complex LAD disease.  PCI to ostial and proximal LAD is feasible but could jeopardize ramus intermedius/LCx and D1. 2. Discontinue clopidogrel pending cardiac surgery consultation.  Heparin infusion to be restarted 2 hours after TR band deflation. 3. Aggressive secondary prevention of coronary artery disease. 4. Follow-up echocardiogram interpretation. Nelva Bush, MD [facility]   DG Chest Port 1 View  Result Date: 10/17/2019 CLINICAL DATA:  History of CABG. EXAM: PORTABLE CHEST 1 VIEW COMPARISON:  10/16/2019 FINDINGS: Right jugular central line has been removed. Patchy densities in the left lower lung are most  compatible with atelectasis and possibly a small left pleural effusion. Slightly low lung volumes without pneumothorax. Heart size is stable with median sternotomy wires. IMPRESSION: Densities in the left lower chest. Findings are suggestive for atelectasis and cannot exclude a small left pleural effusion. Electronically Signed   By: Markus Daft M.D.   On: 10/17/2019 08:35   DG Chest Port 1 View  Result Date: 10/16/2019 CLINICAL DATA:  Chest pain, history of CABG EXAM: PORTABLE CHEST 1 VIEW COMPARISON:  Chest radiograph from one day prior. FINDINGS: Intact sternotomy wires. Right internal jugular central venous sheath terminates in the high right SVC. Stable cardiomediastinal silhouette with mild cardiomegaly. No pneumothorax. No pleural effusion. No overt pulmonary edema. Mild-to-moderate bibasilar atelectasis, similar. IMPRESSION: 1. No pneumothorax. 2. Stable mild cardiomegaly without overt pulmonary edema. 3. Stable mild-to-moderate bibasilar atelectasis. Electronically Signed   By: Ilona Sorrel M.D.   On: 10/16/2019 10:21   DG Chest Port 1 View  Result Date: 10/15/2019 CLINICAL DATA:  Status post coronary bypass graft. EXAM: PORTABLE CHEST 1 VIEW COMPARISON:  October 14, 2019. FINDINGS: Stable cardiomediastinal silhouette. Left-sided chest tube is noted without pneumothorax. Right internal jugular catheter is noted with tip directed into right pulmonary artery. Endotracheal and nasogastric tubes have been removed. Mild bibasilar subsegmental atelectasis is noted with minimal pleural effusions. Bony thorax is unremarkable. IMPRESSION: Left-sided chest tube is noted without pneumothorax. Mild bibasilar subsegmental atelectasis is noted with minimal pleural effusions. Endotracheal and nasogastric tubes have been removed. Right internal jugular catheter is noted with tip directed into right pulmonary artery. Electronically Signed   By: Marijo Conception M.D.   On: 10/15/2019 10:14   DG Chest Port 1  View  Result Date: 10/14/2019 CLINICAL DATA:  Hypoxia. Status post coronary artery bypass grafting EXAM: PORTABLE CHEST 1 VIEW COMPARISON:  October 09, 2019 FINDINGS: Endotracheal tube tip is 5.1 cm above the carina. Nasogastric tube tip and side port are below the diaphragm. Swan-Ganz catheter tip is in the main pulmonary outflow tract directed toward the right. There is a left chest tube and a mediastinal drain. No pneumothorax. There is a small left pleural effusion with left base atelectasis. Lungs elsewhere are clear. Heart size and pulmonary vascularity are normal. No adenopathy. Status post median sternotomy. IMPRESSION: Tube and catheter positions as described without pneumothorax. Small left pleural effusion with left base atelectasis. Lungs elsewhere clear. Heart size within normal limits. Electronically Signed   By: Lowella Grip III M.D.   On: 10/14/2019 13:20   DG Chest Port 1 View  Result Date: 10/09/2019 CLINICAL DATA:  Chest pain. EXAM: PORTABLE CHEST 1 VIEW COMPARISON:  May 16, 2014 FINDINGS: The heart size and mediastinal contours are within normal limits. Both lungs are clear. Degenerative  changes seen throughout the thoracic spine. IMPRESSION: No active disease. Electronically Signed   By: Virgina Norfolk M.D.   On: 10/09/2019 03:23   ECHOCARDIOGRAM COMPLETE  Result Date: 10/10/2019    ECHOCARDIOGRAM REPORT   Patient Name:   JAYLENN GEORGESON [facility] Date of Exam: 10/10/2019 Medical Rec #:  [id number]          Height:       68.0 in Accession #:    [id number]         Weight:       235.1 lb Date of Birth:  [birth year]         BSA:          2.189 m Patient Age:    9 years           BP:           166/74 mmHg Patient Gender: M                  HR:           62 bpm. Exam Location:  Inpatient Procedure: 2D Echo Indications:    NSTEMI I21.4  History:        Patient has prior history of Echocardiogram examinations, most                 recent 03/19/2018. Previous Myocardial Infarction; Risk                  Factors:Hypertension and Dyslipidemia.  Sonographer:    Mikki Santee RDCS (AE) Referring Phys: [id number] [place]  1. Left ventricular ejection fraction, by estimation, is 45 to 50%. The left ventricle has mildly decreased function. The left ventricle demonstrates global hypokinesis. There is mild concentric left ventricular hypertrophy. Left ventricular diastolic parameters are consistent with Grade I diastolic dysfunction (impaired relaxation).  2. Right ventricular systolic function is normal. The right ventricular size is normal. There is normal pulmonary artery systolic pressure.  3. The mitral valve is normal in structure. Trivial mitral valve regurgitation. No evidence of mitral stenosis.  4. The aortic valve is normal in structure. Aortic valve regurgitation is not visualized. No aortic stenosis is present.  5. Aortic dilatation noted. There is mild dilatation at the level of the sinuses of Valsalva measuring 37 mm.  6. The inferior vena cava is normal in size with <50% respiratory variability, suggesting right atrial pressure of 8 mmHg. FINDINGS  Left Ventricle: Left ventricular ejection fraction, by estimation, is 45 to 50%. The left ventricle has mildly decreased function. The left ventricle demonstrates global hypokinesis. The left ventricular internal cavity size was normal in size. There is  mild concentric left ventricular hypertrophy. Left ventricular diastolic parameters are consistent with Grade I diastolic dysfunction (impaired relaxation). Indeterminate filling pressures. Right Ventricle: The right ventricular size is normal. No increase in right ventricular wall thickness. Right ventricular systolic function is normal. There is normal pulmonary artery systolic pressure. The tricuspid regurgitant velocity is 1.36 m/s, and  with an assumed right atrial pressure of 8 mmHg, the estimated right ventricular systolic pressure is 123XX123 mmHg. Left Atrium: Left atrial  size was normal in size. Right Atrium: Right atrial size was normal in size. Pericardium: There is no evidence of pericardial effusion. Mitral Valve: The mitral valve is normal in structure. Normal mobility of the mitral valve leaflets. Trivial mitral valve regurgitation. No evidence of mitral valve stenosis. Tricuspid Valve: The tricuspid valve is normal in structure. Tricuspid valve regurgitation is trivial.  No evidence of tricuspid stenosis. Aortic Valve: The aortic valve is normal in structure. Aortic valve regurgitation is not visualized. No aortic stenosis is present. Pulmonic Valve: The pulmonic valve was normal in structure. Pulmonic valve regurgitation is not visualized. No evidence of pulmonic stenosis. Aorta: Aortic dilatation noted. There is mild dilatation at the level of the sinuses of Valsalva measuring 37 mm. Venous: The inferior vena cava is normal in size with less than 50% respiratory variability, suggesting right atrial pressure of 8 mmHg. IAS/Shunts: No atrial level shunt detected by color flow Doppler.  LEFT VENTRICLE PLAX 2D LVIDd:         4.40 cm  Diastology LVIDs:         3.20 cm  LV e' lateral:   4.24 cm/s LV PW:         1.10 cm  LV E/e' lateral: 12.2 LV IVS:        1.20 cm  LV e' medial:    4.57 cm/s LVOT diam:     2.40 cm  LV E/e' medial:  11.3 LV SV:         87 LV SV Index:   40 LVOT Area:     4.52 cm  RIGHT VENTRICLE RV S prime:     14.60 cm/s TAPSE (M-mode): 2.2 cm LEFT ATRIUM             Index       RIGHT ATRIUM           Index LA diam:        3.30 cm 1.51 cm/m  RA Area:     13.90 cm LA Vol (A2C):   57.3 ml 26.17 ml/m RA Volume:   27.90 ml  12.74 ml/m LA Vol (A4C):   36.5 ml 16.67 ml/m LA Biplane Vol: 45.6 ml 20.83 ml/m  AORTIC VALVE LVOT Vmax:   85.50 cm/s LVOT Vmean:  47.000 cm/s LVOT VTI:    0.193 m  AORTA Ao Root diam: 3.70 cm MITRAL VALVE                TRICUSPID VALVE MV Area (PHT): 3.12 cm     TR Peak grad:   7.4 mmHg MV Decel Time: 243 msec     TR Vmax:        136.00  cm/s MV E velocity: 51.80 cm/s MV A velocity: 104.00 cm/s  SHUNTS MV E/A ratio:  0.50         Systemic VTI:  0.19 m                             Systemic Diam: 2.40 cm Skeet Latch MD Electronically signed by Skeet Latch MD Signature Date/Time: 10/10/2019/4:28:26 PM    Final    ECHO INTRAOPERATIVE TEE  Result Date: 10/17/2019  *INTRAOPERATIVE TRANSESOPHAGEAL REPORT *  Patient Name:   JERRIUS RICCIARDELLI [facility] Date of Exam: 10/14/2019 Medical Rec #:  [id number]          Height:       68.0 in Accession #:    [id number]         Weight:       232.3 lb Date of Birth:  [birth year]         BSA:          2.18 m Patient Age:    27 years           BP:  164/83 mmHg Patient Gender: M                  HR:           58 bpm. Exam Location:  Inpatient Transesophogeal exam was perform intraoperatively during surgical procedure. Patient was closely monitored under general anesthesia during the entirety of examination. Indications:     coronary artery disease Performing Phys: [id number] Pollack Diagnosing Phys: Albertha Ghee MD Complications: No known complications during this procedure. POST-OP IMPRESSIONS Overall, there were no significant changes from pre-bypass. PRE-OP FINDINGS  Left Ventricle: The left ventricle has low normal systolic function, with an ejection fraction of 50-55%. The cavity size was normal. There is severely increased left ventricular wall thickness. No evidence of left ventricular regional wall motion abnormalities. Right Ventricle: The right ventricle has normal systolic function. The cavity was normal. There is no increase in right ventricular wall thickness. Left Atrium: Left atrial size was normal in size. The left atrial appendage is well visualized and there is no evidence of thrombus present.  Interatrial Septum: No atrial level shunt detected by color flow Doppler. Pericardium: There is no evidence of pericardial effusion. Mitral Valve: The mitral valve is normal in structure. No thickening  of the mitral valve leaflet. No calcification of the mitral valve leaflet. Mitral valve regurgitation is trivial by color flow Doppler. Tricuspid Valve: The tricuspid valve was normal in structure. Tricuspid valve regurgitation is trivial by color flow Doppler. Aortic Valve: The aortic valve is tricuspid Aortic valve regurgitation was not visualized by color flow Doppler. There is no evidence of aortic valve stenosis. Pulmonic Valve: The pulmonic valve was normal in structure, with normal. Pulmonic valve regurgitation is trivial by color flow Doppler. Aorta: There is evidence of atheroma plaque in the descending aorta; Grade I, measuring 1-14mm in size.  Albertha Ghee MD Electronically signed by Albertha Ghee MD Signature Date/Time: 10/17/2019/7:05:30 AM    Final    VAS US DOPPLER PRE CABG  Result Date: 10/11/2019 PREOPERATIVE VASCULAR EVALUATION  Indications:      Pre-CABG. Risk Factors:     Hypertension, hyperlipidemia. Comparison Study: no prior Performing Technologist: Abram Sander RVS  Examination Guidelines: A complete evaluation includes B-mode imaging, spectral Doppler, color Doppler, and power Doppler as needed of all accessible portions of each vessel. Bilateral testing is considered an integral part of a complete examination. Limited examinations for reoccurring indications may be performed as noted.  Right Carotid Findings: +----------+--------+--------+--------+------------+--------+           PSV cm/sEDV cm/sStenosisDescribe    Comments +----------+--------+--------+--------+------------+--------+ CCA Prox  51      9               heterogenous         +----------+--------+--------+--------+------------+--------+ CCA Distal61      12              heterogenous         +----------+--------+--------+--------+------------+--------+ ICA Prox  81      11      1-39%   heterogenous         +----------+--------+--------+--------+------------+--------+ ICA Distal58      14                                    +----------+--------+--------+--------+------------+--------+ ECA       78      10                                   +----------+--------+--------+--------+------------+--------+  Portions of this table do not appear on this page. +----------+--------+-------+--------+------------+           PSV cm/sEDV cmsDescribeArm Pressure +----------+--------+-------+--------+------------+ Subclavian90                                  +----------+--------+-------+--------+------------+ +---------+--------+--+--------+--+---------+ VertebralPSV cm/s36EDV cm/s11Antegrade +---------+--------+--+--------+--+---------+ Left Carotid Findings: +----------+--------+--------+--------+------------+--------+           PSV cm/sEDV cm/sStenosisDescribe    Comments +----------+--------+--------+--------+------------+--------+ CCA Prox  70      14              heterogenous         +----------+--------+--------+--------+------------+--------+ CCA Distal77      21              heterogenous         +----------+--------+--------+--------+------------+--------+ ICA Prox  91      26      1-39%   heterogenous         +----------+--------+--------+--------+------------+--------+ ICA Distal70      25                                   +----------+--------+--------+--------+------------+--------+ ECA       144     9                                    +----------+--------+--------+--------+------------+--------+ +----------+--------+--------+--------+------------+ SubclavianPSV cm/sEDV cm/sDescribeArm Pressure +----------+--------+--------+--------+------------+           86                      162          +----------+--------+--------+--------+------------+ +---------+--------+--------+--------------+ VertebralPSV cm/sEDV cm/sNot identified +---------+--------+--------+--------------+  ABI Findings:  +--------+------------------+-----+---------+--------+ Right   Rt Pressure (mmHg)IndexWaveform Comment  +--------+------------------+-----+---------+--------+ Brachial                       triphasic         +--------+------------------+-----+---------+--------+ ATA                            triphasic         +--------+------------------+-----+---------+--------+ PTA                            triphasic         +--------+------------------+-----+---------+--------+ +--------+------------------+-----+---------+-------+ Left    Lt Pressure (mmHg)IndexWaveform Comment +--------+------------------+-----+---------+-------+ [id number]                    triphasic        +--------+------------------+-----+---------+-------+ ATA                            triphasic        +--------+------------------+-----+---------+-------+ PTA                            triphasic        +--------+------------------+-----+---------+-------+  Right Doppler Findings: +--------+--------+-----+---------+--------+ Site    PressureIndexDoppler  Comments +--------+--------+-----+---------+--------+ Brachial             triphasic         +--------+--------+-----+---------+--------+ Radial  triphasic         +--------+--------+-----+---------+--------+ Ulnar                triphasic         +--------+--------+-----+---------+--------+  Left Doppler Findings: +--------+--------+-----+---------+--------+ Site    PressureIndexDoppler  Comments +--------+--------+-----+---------+--------+ [id number]          triphasic         +--------+--------+-----+---------+--------+ Radial               triphasic         +--------+--------+-----+---------+--------+ Ulnar                triphasic         +--------+--------+-----+---------+--------+  Summary: Right Carotid: Velocities in the right ICA are consistent with a 1-39% stenosis. Left Carotid:  Velocities in the left ICA are consistent with a 1-39% stenosis. Vertebrals: Right vertebral artery demonstrates antegrade flow. Left vertebral             artery was not visualized. Right Upper Extremity: Doppler waveforms remain within normal limits with right radial compression. Doppler waveform obliterate with right ulnar compression. Left Upper Extremity: Doppler waveforms remain within normal limits with left radial compression. Doppler waveforms remain within normal limits with left ulnar compression.  Electronically signed by Deitra Mayo MD on 10/11/2019 at 3:19:11 PM.    Final      Discharge Medications: Allergies as of 10/19/2019      Reactions   Aspirin Anaphylaxis   Other    Foods that are high in acid content cause sores and a rash.    Penicillins Nausea And Vomiting      Medication List    STOP taking these medications   carvedilol 12.5 MG tablet Commonly known as: COREG   isosorbide mononitrate 30 MG 24 hr tablet Commonly known as: IMDUR   lisinopril 20 MG tablet Commonly known as: ZESTRIL   nitroGLYCERIN 0.4 MG SL tablet Commonly known as: NITROSTAT     TAKE these medications   amLODipine 5 MG tablet Commonly known as: NORVASC Take 1 tablet (5 mg total) by mouth daily.   atorvastatin 80 MG tablet Commonly known as: LIPITOR Take 1 tablet daily at 6 pm.   clopidogrel 75 MG tablet Commonly known as: PLAVIX TAKE 1 TABLET BY MOUTH ONCE DAILY - NEED FOLLOW UP FOR REFILLS   ferrous sulfate 325 (65 FE) MG tablet Take 1 tablet (325 mg total) by mouth daily with breakfast. For one month then stop. If develops constipation, may stop sooner   Fish Oil 1000 MG Caps Take 1,000 mg by mouth 2 (two) times daily.   furosemide 40 MG tablet Commonly known as: LASIX Take 1 tablet (40 mg total) by mouth daily. For 4 days then stop.   Metoprolol Tartrate 37.5 MG Tabs Take 37.5 mg by mouth 2 (two) times daily.   potassium chloride SA 20 MEQ tablet Commonly known  as: KLOR-CON Take 1 tablet (20 mEq total) by mouth daily. For 4 days then stop.   traMADol 50 MG tablet Commonly known as: ULTRAM Take 1 tablet (50 mg total) by mouth every 6 (six) hours as needed for moderate pain.   vitamin C 1000 MG tablet Take 1,000 mg by mouth daily.   vitamin E 1000 UNIT capsule Take 1 capsule (1,000 Units total) by mouth daily. Start taking on: Oct 29, 2019 What changed: These instructions start on Oct 29, 2019. If you are unsure what to do until then, ask your doctor  or other care provider.      The patient has been discharged on:   1.Beta Blocker:  Yes [  x ]                              No   [   ]                              If No, reason:  2.Ace Inhibitor/ARB: Yes [   ]                                     No  [   x ]                                     If No, reason:Elevated creatinine;will try to restart as an outpatient once creatinine normalized  3.Statin:   Yes [ x  ]                  No  [   ]                  If No, reason:  4.Ecasa:  Yes  [   ]                  No   [ x  ]                  If No, reason: Allergy (anaphylaxis)  Follow Up Appointments: Follow-up Information    Gaye Pollack, MD. Go on 11/23/2019.   Specialty: Cardiothoracic Surgery Why: PA/LAT CXR to be taken (at [facility] which is in the same building as Dr. Vivi Martens office) on 05/26 at 4:00 pm;Appointment time is at 4:30 pm Contact information: [place] [address] [phone]        Imogene Burn, PA-C. Go on 11/01/2019.   Specialty: Cardiology Why: Appointment time is at 1:00 pm Contact information: [place] [address] [phone]        [facility], [facility]. Call.   Specialty: Family Medicine Why: for a follow up appointment regarding further surveillance of HGA1C 6.3 (pre diabetes) Contact information: [place] [address] [phone]        [facility] [place]. Go on 10/31/2019.   Specialty: Cardiothoracic Surgery Why: Appointment is with nurse only for chest tube suture removal. Appointment time is at 10:00 am Contact information: [address] [place], [place] [phone]          Signed: Kendrick 10/19/2019, 7:22 AM

## 2019-10-14 NOTE — Brief Op Note (Addendum)
10/09/2019 - 10/14/2019  10:39 AM  PATIENT:  Corey [name]  84 y.o. male  PRE-OPERATIVE DIAGNOSIS:  1. S/p NSTEMI 2.CAD  POST-OPERATIVE DIAGNOSIS:  1. S/p NSTEMI 2. CAD  PROCEDURE: TRANSESOPHAGEAL ECHOCARDIOGRAM (TEE),  CORONARY ARTERY BYPASS GRAFTING (CABG) x 2 (LIMA to LAD, SVG to DIAGONAL 1) with endoscopic Harvest Of Right Greater Saphenous Vein   SURGEON:  Surgeon(s) and Role:    Gaye Pollack, MD - Primary  PHYSICIAN ASSISTANT: Lars Pinks PA-C  ASSISTANTS: Dineen Kid RNFA  ANESTHESIA:   general  EBL:  Per anesthesia and perfusion record  DRAINS: Chest tubes placed in the mediastinal and pleural spaces   COUNTS CORRECT:  YES  DICTATION: .Dragon Dictation  PLAN OF CARE: Admit to inpatient   PATIENT DISPOSITION:  ICU - intubated and hemodynamically stable.   Delay start of Pharmacological VTE agent (>24hrs) due to surgical blood loss or risk of bleeding: yes  BASELINE WEIGHT: 105.4 kg

## 2019-10-14 NOTE — Progress Notes (Signed)
  Echocardiogram Echocardiogram Transesophageal has been performed.  Corey [name] 10/14/2019, 9:31 AM

## 2019-10-14 NOTE — Progress Notes (Addendum)
Patient had difficulty completing the NIF and VC due to not being able to get a good seal on the device.   OK to extubate per Dr. Darcey Nora.  Patient placed on [place] Viola 8L, Patient able to speak, no stridor noted.  Patient does have increased WOB, sats are stable at 93%.  DR. Prescott Gum wanted Bipap started. Patient tolerating Bipap 12/5 well at this time.

## 2019-10-14 NOTE — Anesthesia Procedure Notes (Signed)
Central Venous Catheter Insertion Performed by: Albertha Ghee, MD, anesthesiologist Start/End4/16/2021 7:00 AM, 10/14/2019 7:11 AM Patient location: Pre-op. Preanesthetic checklist: patient identified, IV checked, site marked, risks and benefits discussed, surgical consent, monitors and equipment checked, pre-op evaluation, timeout performed and anesthesia consent Position: Trendelenburg Lidocaine 1% used for infiltration and patient sedated Hand hygiene performed , maximum sterile barriers used  and Seldinger technique used Catheter size: 9 Fr Central line was placed.MAC introducer Swan type:thermodilation Procedure performed using ultrasound guided technique. Ultrasound Notes:anatomy identified, needle tip was noted to be adjacent to the nerve/plexus identified, no ultrasound evidence of intravascular and/or intraneural injection and image(s) printed for medical record Attempts: 1 Following insertion, line sutured, dressing applied and Biopatch. Post procedure assessment: blood return through all ports, free fluid flow and no air  Patient tolerated the procedure well with no immediate complications.

## 2019-10-14 NOTE — Anesthesia Procedure Notes (Signed)
Procedure Name: Intubation Date/Time: 10/07/2019 7:35 AM Performed by: Lavell Luster, CRNA Pre-anesthesia Checklist: Patient identified, Emergency Drugs available, Suction available, Patient being monitored and Timeout performed Patient Re-evaluated:Patient Re-evaluated prior to induction Oxygen Delivery Method: Circle system utilized Preoxygenation: Pre-oxygenation with 100% oxygen Induction Type: IV induction Ventilation: Oral airway inserted - appropriate to patient size and Mask ventilation with difficulty Laryngoscope Size: Mac and 4 Grade View: Grade I Tube type: Oral Tube size: 7.5 mm Number of attempts: 1 Airway Equipment and Method: Stylet and Video-laryngoscopy Placement Confirmation: ETT inserted through vocal cords under direct vision,  positive ETCO2 and breath sounds checked- equal and bilateral Secured at: 21 cm Tube secured with: Tape Dental Injury: Teeth and Oropharynx as per pre-operative assessment  Difficulty Due To: Difficulty was anticipated, Difficult Airway- due to limited oral opening and Difficult Airway- due to large tongue

## 2019-10-14 NOTE — Anesthesia Procedure Notes (Signed)
Arterial Line Insertion Start/End4/16/2021 6:45 AM, 10/14/2019 6:55 AM Performed by: Lavell Luster, CRNA, CRNA  Preanesthetic checklist: patient identified, IV checked, risks and benefits discussed, surgical consent, monitors and equipment checked, pre-op evaluation and timeout performed Lidocaine 1% used for infiltration Left, radial was placed Catheter size: 20 G Hand hygiene performed , maximum sterile barriers used  and Seldinger technique used Allen's test indicative of satisfactory collateral circulation Attempts: 1 Procedure performed without using ultrasound guided technique. Following insertion, Biopatch and dressing applied. Post procedure assessment: normal  Patient tolerated the procedure well with no immediate complications.

## 2019-10-14 NOTE — Op Note (Signed)
CARDIOVASCULAR SURGERY OPERATIVE NOTE  10/14/2019  Surgeon:  Gaye Pollack, MD  First Assistant: Lars Pinks, PA-C   Preoperative Diagnosis:  Severe single-vessel coronary artery disease   Postoperative Diagnosis:  Same   Procedure:  1. Median Sternotomy 2. Extracorporeal circulation 3.   Coronary artery bypass grafting x 2   Left internal mammary artery graft to the LAD  SVG to diagonal   4.   Endoscopic vein harvest from the right leg   Anesthesia:  General Endotracheal   Clinical History/Surgical Indication:  This 84 year old gentleman has severe single-vessel coronary disease with high-grade ostial and proximal LAD stenosis as well as a 75% stenosis at the bifurcation of a large diagonal branch.  He presents with unstable anginal symptoms and ruled in for non-ST segment elevation MI.  This lesion is not recommended due to the anatomy and I agree that coronary bypass graft surgery to the LAD and diagonal branch is the best treatment for this gentleman. I discussed the operative procedure with the patient and family including alternatives, benefits and risks; including but not limited to bleeding, blood transfusion, infection, stroke, myocardial infarction, graft failure, heart block requiring a permanent pacemaker, organ dysfunction, and death.  Eddie Dibbles Goedken understands and agrees to proceed.   Preparation:  The patient was seen in the preoperative holding area and the correct patient, correct operation were confirmed with the patient after reviewing the medical record and catheterization. The consent was signed by me. Preoperative antibiotics were given. A pulmonary arterial line and radial arterial line were placed by the anesthesia team. The patient was taken back to the operating room and positioned supine on the operating room table. After being placed under  general endotracheal anesthesia by the anesthesia team a foley catheter was placed. The neck, chest, abdomen, and both legs were prepped with betadine soap and solution and draped in the usual sterile manner. A surgical time-out was taken and the correct patient and operative procedure were confirmed with the nursing and anesthesia staff.   Cardiopulmonary Bypass:  A median sternotomy was performed. The pericardium was opened in the midline. Right ventricular function appeared normal. The ascending aorta was of normal size and had calcified plaque present posteriorly in the proximal and mid-ascending aorta. The proximal arch was soft with no palpable plaque and the aorta just proximal to the innominate artery was soft to allow cross-clamping. The patient was fully systemically heparinized and the ACT was maintained > 400 sec. The proximal aortic arch was cannulated with a 20 F aortic cannula for arterial inflow. Venous cannulation was performed via the right atrial appendage using a two-staged venous cannula. An antegrade cardioplegia/vent cannula was inserted into the mid-ascending aorta. Aortic occlusion was performed with a single cross-clamp. Systemic cooling to 32 degrees Centigrade and topical cooling of the heart with iced saline were used. Hyperkalemic antegrade cold blood cardioplegia was used to induce diastolic arrest and was then given at about 20 minute intervals throughout the period of arrest to maintain myocardial temperature at or below 10 degrees centigrade. A temperature probe was inserted into the interventricular septum and an insulating pad was placed in the pericardium.   Left internal mammary artery harvest:  The left side of the sternum was retracted using the Rultract retractor. The left internal mammary artery was harvested as a pedicle graft. All side branches were clipped. It was a medium-sized vessel of good quality with excellent blood flow. It was ligated distally and  divided. It was sprayed with topical  papaverine solution to prevent vasospasm.   Endoscopic vein harvest:  The right greater saphenous vein was harvested endoscopically through a 2 cm incision medial to the right knee. It was harvested from the lower thigh. It was a medium-sized vein of good quality. The side branches were all ligated with 4-0 silk ties.    Coronary arteries:  The coronary arteries were examined.   LAD:  Large vessel with no distal disease. Diagonal was a medium caliber vessel that was intramyocardial beyond the stented segment and was located in the muscle.   LCX:  No significant stenosis on cath  RCA:  No significant stenosis on cath   Grafts:  1. LIMA to the LAD: 2.5 mm. It was sewn end to side using 8-0 prolene continuous suture. 2. SVG to diagonal:  1.75 mm. It was sewn end to side using 7-0 prolene continuous suture.    The proximal vein graft anastomosis was performed to the mid-ascending aorta using continuous 6-0 prolene suture. A graft marker was placed around the proximal anastomosis.   Completion:  The patient was rewarmed to 37 degrees Centigrade. The clamp was removed from the LIMA pedicle and there was rapid warming of the septum and return of ventricular fibrillation. The crossclamp was removed with a time of 42 minutes. There was spontaneous return of sinus rhythm. The distal and proximal anastomoses were checked for hemostasis. The position of the grafts was satisfactory. Two temporary epicardial pacing wires were placed on the right atrium and two on the right ventricle. The patient was weaned from CPB without difficulty on no inotropes. CPB time was 59 minutes. Cardiac output was 4.5 LPM. TEE showed improved LV systolic function. Heparin was fully reversed with protamine and the aortic and venous cannulas removed. Hemostasis was achieved. Mediastinal and left pleural drainage tubes were placed. The sternum was closed with double #6 stainless steel  wires. The fascia was closed with continuous # 1 vicryl suture. The subcutaneous tissue was closed with 2-0 vicryl continuous suture. The skin was closed with 3-0 vicryl subcuticular suture. All sponge, needle, and instrument counts were reported correct at the end of the case. Dry sterile dressings were placed over the incisions and around the chest tubes which were connected to pleurevac suction. The patient was then transported to the surgical intensive care unit in stable condition.

## 2019-10-14 NOTE — Anesthesia Preprocedure Evaluation (Signed)
Anesthesia Evaluation  Patient identified by MRN, date of birth, ID band Patient awake    Reviewed: Allergy & Precautions, H&P , NPO status , Patient's Chart, lab work & pertinent test results  Airway Mallampati: II   Neck ROM: full    Dental   Pulmonary former smoker,    breath sounds clear to auscultation       Cardiovascular hypertension, + angina + CAD, + Past MI and + Cardiac Stents   Rhythm:regular Rate:Normal     Neuro/Psych    GI/Hepatic   Endo/Other  obese  Renal/GU Renal InsufficiencyRenal disease     Musculoskeletal   Abdominal   Peds  Hematology   Anesthesia Other Findings   Reproductive/Obstetrics                             Anesthesia Physical Anesthesia Plan  ASA: III  Anesthesia Plan: General   Post-op Pain Management:    Induction: Intravenous  PONV Risk Score and Plan: 2 and Ondansetron, Dexamethasone, Midazolam and Treatment may vary due to age or medical condition  Airway Management Planned: Oral ETT  Additional Equipment: Arterial line, CVP, PA Cath, TEE and Ultrasound Guidance Line Placement  Intra-op Plan:   Post-operative Plan: Post-operative intubation/ventilation  Informed Consent: I have reviewed the patients History and Physical, chart, labs and discussed the procedure including the risks, benefits and alternatives for the proposed anesthesia with the patient or authorized representative who has indicated his/her understanding and acceptance.       Plan Discussed with: CRNA, Anesthesiologist and Surgeon  Anesthesia Plan Comments:         Anesthesia Quick Evaluation

## 2019-10-14 NOTE — Progress Notes (Signed)
CT surgery p.m. Rounds  84 year old male had multivessel CABG earlier today Following extubation he had difficulty moving air despite adequate tidal volumes on pressure support Patient appears more comfortable on BiPAP and will follow ABGs closely. Hemodynamic stable and patient not bleeding Reglan

## 2019-10-15 ENCOUNTER — Inpatient Hospital Stay (HOSPITAL_COMMUNITY): Payer: Medicare HMO

## 2019-10-15 DIAGNOSIS — I5042 Chronic combined systolic (congestive) and diastolic (congestive) heart failure: Secondary | ICD-10-CM | POA: Diagnosis not present

## 2019-10-15 LAB — POCT I-STAT 7, (LYTES, BLD GAS, ICA,H+H)
Acid-base deficit: 2 mmol/L (ref 0.0–2.0)
Acid-base deficit: 4 mmol/L — ABNORMAL HIGH (ref 0.0–2.0)
Acid-base deficit: 4 mmol/L — ABNORMAL HIGH (ref 0.0–2.0)
Acid-base deficit: 4 mmol/L — ABNORMAL HIGH (ref 0.0–2.0)
Acid-base deficit: 5 mmol/L — ABNORMAL HIGH (ref 0.0–2.0)
Acid-base deficit: 3 mmol/L — ABNORMAL HIGH (ref 0.0–2.0)
Acid-base deficit: 1 mmol/L (ref 0.0–2.0)
Bicarbonate: 22.6 mmol/L (ref 20.0–28.0)
Bicarbonate: 20.9 mmol/L (ref 20.0–28.0)
Bicarbonate: 21.6 mmol/L (ref 20.0–28.0)
Bicarbonate: 22.1 mmol/L (ref 20.0–28.0)
Bicarbonate: 19.9 mmol/L — ABNORMAL LOW (ref 20.0–28.0)
Bicarbonate: 21.5 mmol/L (ref 20.0–28.0)
Bicarbonate: 22.9 mmol/L (ref 20.0–28.0)
Calcium, Ion: 1.22 mmol/L (ref 1.15–1.40)
Calcium, Ion: 1.19 mmol/L (ref 1.15–1.40)
Calcium, Ion: 1.18 mmol/L (ref 1.15–1.40)
Calcium, Ion: 1.18 mmol/L (ref 1.15–1.40)
Calcium, Ion: 1.12 mmol/L — ABNORMAL LOW (ref 1.15–1.40)
Calcium, Ion: 1.21 mmol/L (ref 1.15–1.40)
Calcium, Ion: 1.19 mmol/L (ref 1.15–1.40)
HCT: 29 % — ABNORMAL LOW (ref 39.0–52.0)
HCT: 30 % — ABNORMAL LOW (ref 39.0–52.0)
HCT: 28 % — ABNORMAL LOW (ref 39.0–52.0)
HCT: 30 % — ABNORMAL LOW (ref 39.0–52.0)
HCT: 27 % — ABNORMAL LOW (ref 39.0–52.0)
HCT: 30 % — ABNORMAL LOW (ref 39.0–52.0)
HCT: 30 % — ABNORMAL LOW (ref 39.0–52.0)
Hemoglobin: 9.9 g/dL — ABNORMAL LOW (ref 13.0–17.0)
Hemoglobin: 10.2 g/dL — ABNORMAL LOW (ref 13.0–17.0)
Hemoglobin: 9.5 g/dL — ABNORMAL LOW (ref 13.0–17.0)
Hemoglobin: 10.2 g/dL — ABNORMAL LOW (ref 13.0–17.0)
Hemoglobin: 9.2 g/dL — ABNORMAL LOW (ref 13.0–17.0)
Hemoglobin: 10.2 g/dL — ABNORMAL LOW (ref 13.0–17.0)
Hemoglobin: 10.2 g/dL — ABNORMAL LOW (ref 13.0–17.0)
O2 Saturation: 92 %
O2 Saturation: 92 %
O2 Saturation: 94 %
O2 Saturation: 91 %
O2 Saturation: 92 %
O2 Saturation: 90 %
O2 Saturation: 94 %
Patient temperature: 35.3
Patient temperature: 36.2
Patient temperature: 37.3
Patient temperature: 37
Patient temperature: 37
Patient temperature: 99
Patient temperature: 98
Potassium: 4.3 mmol/L (ref 3.5–5.1)
Potassium: 4.2 mmol/L (ref 3.5–5.1)
Potassium: 4.3 mmol/L (ref 3.5–5.1)
Potassium: 4.3 mmol/L (ref 3.5–5.1)
Potassium: 3.9 mmol/L (ref 3.5–5.1)
Potassium: 4.3 mmol/L (ref 3.5–5.1)
Potassium: 4.2 mmol/L (ref 3.5–5.1)
Sodium: 137 mmol/L (ref 135–145)
Sodium: 139 mmol/L (ref 135–145)
Sodium: 140 mmol/L (ref 135–145)
Sodium: 141 mmol/L (ref 135–145)
Sodium: 142 mmol/L (ref 135–145)
Sodium: 140 mmol/L (ref 135–145)
Sodium: 139 mmol/L (ref 135–145)
TCO2: 24 mmol/L (ref 22–32)
TCO2: 22 mmol/L (ref 22–32)
TCO2: 23 mmol/L (ref 22–32)
TCO2: 23 mmol/L (ref 22–32)
TCO2: 21 mmol/L — ABNORMAL LOW (ref 22–32)
TCO2: 23 mmol/L (ref 22–32)
TCO2: 24 mmol/L (ref 22–32)
pCO2 arterial: 36.1 mmHg (ref 32.0–48.0)
pCO2 arterial: 35.4 mmHg (ref 32.0–48.0)
pCO2 arterial: 39.8 mmHg (ref 32.0–48.0)
pCO2 arterial: 42 mmHg (ref 32.0–48.0)
pCO2 arterial: 34.3 mmHg (ref 32.0–48.0)
pCO2 arterial: 37.4 mmHg (ref 32.0–48.0)
pCO2 arterial: 35.5 mmHg (ref 32.0–48.0)
pH, Arterial: 7.396 (ref 7.350–7.450)
pH, Arterial: 7.376 (ref 7.350–7.450)
pH, Arterial: 7.343 — ABNORMAL LOW (ref 7.350–7.450)
pH, Arterial: 7.329 — ABNORMAL LOW (ref 7.350–7.450)
pH, Arterial: 7.371 (ref 7.350–7.450)
pH, Arterial: 7.369 (ref 7.350–7.450)
pH, Arterial: 7.416 (ref 7.350–7.450)
pO2, Arterial: 58 mmHg — ABNORMAL LOW (ref 83.0–108.0)
pO2, Arterial: 62 mmHg — ABNORMAL LOW (ref 83.0–108.0)
pO2, Arterial: 77 mmHg — ABNORMAL LOW (ref 83.0–108.0)
pO2, Arterial: 66 mmHg — ABNORMAL LOW (ref 83.0–108.0)
pO2, Arterial: 66 mmHg — ABNORMAL LOW (ref 83.0–108.0)
pO2, Arterial: 61 mmHg — ABNORMAL LOW (ref 83.0–108.0)
pO2, Arterial: 66 mmHg — ABNORMAL LOW (ref 83.0–108.0)

## 2019-10-15 LAB — CBC
HCT: 31 % — ABNORMAL LOW (ref 39.0–52.0)
HCT: 30.1 % — ABNORMAL LOW (ref 39.0–52.0)
Hemoglobin: 10.1 g/dL — ABNORMAL LOW (ref 13.0–17.0)
Hemoglobin: 9.7 g/dL — ABNORMAL LOW (ref 13.0–17.0)
MCH: 27.2 pg (ref 26.0–34.0)
MCH: 26.9 pg (ref 26.0–34.0)
MCHC: 32.6 g/dL (ref 30.0–36.0)
MCHC: 32.2 g/dL (ref 30.0–36.0)
MCV: 83.6 fL (ref 80.0–100.0)
MCV: 83.4 fL (ref 80.0–100.0)
Platelets: 141 10*3/uL — ABNORMAL LOW (ref 150–400)
Platelets: 132 10*3/uL — ABNORMAL LOW (ref 150–400)
RBC: 3.71 MIL/uL — ABNORMAL LOW (ref 4.22–5.81)
RBC: 3.61 MIL/uL — ABNORMAL LOW (ref 4.22–5.81)
RDW: 17.1 % — ABNORMAL HIGH (ref 11.5–15.5)
RDW: 17.4 % — ABNORMAL HIGH (ref 11.5–15.5)
WBC: 14.4 10*3/uL — ABNORMAL HIGH (ref 4.0–10.5)
WBC: 15.7 10*3/uL — ABNORMAL HIGH (ref 4.0–10.5)
nRBC: 0 % (ref 0.0–0.2)
nRBC: 0 % (ref 0.0–0.2)

## 2019-10-15 LAB — BASIC METABOLIC PANEL
Anion gap: 7 (ref 5–15)
Anion gap: 9 (ref 5–15)
BUN: 17 mg/dL (ref 8–23)
BUN: 24 mg/dL — ABNORMAL HIGH (ref 8–23)
CO2: 22 mmol/L (ref 22–32)
CO2: 21 mmol/L — ABNORMAL LOW (ref 22–32)
Calcium: 8.1 mg/dL — ABNORMAL LOW (ref 8.9–10.3)
Calcium: 8.4 mg/dL — ABNORMAL LOW (ref 8.9–10.3)
Chloride: 107 mmol/L (ref 98–111)
Chloride: 107 mmol/L (ref 98–111)
Creatinine, Ser: 1.42 mg/dL — ABNORMAL HIGH (ref 0.61–1.24)
Creatinine, Ser: 1.5 mg/dL — ABNORMAL HIGH (ref 0.61–1.24)
GFR calc Af Amer: 53 mL/min — ABNORMAL LOW (ref >60)
GFR calc Af Amer: 50 mL/min — ABNORMAL LOW (ref >60)
GFR calc non Af Amer: 46 mL/min — ABNORMAL LOW (ref >60)
GFR calc non Af Amer: 43 mL/min — ABNORMAL LOW (ref >60)
Glucose, Bld: 134 mg/dL — ABNORMAL HIGH (ref 70–99)
Glucose, Bld: 149 mg/dL — ABNORMAL HIGH (ref 70–99)
Potassium: 4.5 mmol/L (ref 3.5–5.1)
Potassium: 4.4 mmol/L (ref 3.5–5.1)
Sodium: 136 mmol/L (ref 135–145)
Sodium: 137 mmol/L (ref 135–145)

## 2019-10-15 LAB — GLUCOSE, CAPILLARY
Glucose-Capillary: 117 mg/dL — ABNORMAL HIGH (ref 70–99)
Glucose-Capillary: 117 mg/dL — ABNORMAL HIGH (ref 70–99)
Glucose-Capillary: 123 mg/dL — ABNORMAL HIGH (ref 70–99)
Glucose-Capillary: 123 mg/dL — ABNORMAL HIGH (ref 70–99)
Glucose-Capillary: 126 mg/dL — ABNORMAL HIGH (ref 70–99)
Glucose-Capillary: 129 mg/dL — ABNORMAL HIGH (ref 70–99)
Glucose-Capillary: 129 mg/dL — ABNORMAL HIGH (ref 70–99)
Glucose-Capillary: 131 mg/dL — ABNORMAL HIGH (ref 70–99)
Glucose-Capillary: 132 mg/dL — ABNORMAL HIGH (ref 70–99)
Glucose-Capillary: 133 mg/dL — ABNORMAL HIGH (ref 70–99)
Glucose-Capillary: 135 mg/dL — ABNORMAL HIGH (ref 70–99)

## 2019-10-15 LAB — POCT ACTIVATED CLOTTING TIME: Activated Clotting Time: 252 seconds

## 2019-10-15 LAB — HEMOGLOBIN A1C
Hgb A1c MFr Bld: 6.3 % — ABNORMAL HIGH (ref 4.8–5.6)
Mean Plasma Glucose: 134.11 mg/dL

## 2019-10-15 LAB — MAGNESIUM
Magnesium: 2.6 mg/dL — ABNORMAL HIGH (ref 1.7–2.4)
Magnesium: 2.4 mg/dL (ref 1.7–2.4)

## 2019-10-15 MED ORDER — FUROSEMIDE 10 MG/ML IJ SOLN
40.0000 mg | Freq: Once | INTRAMUSCULAR | Status: AC
  Administered 2019-10-15: 03:00:00 40 mg via INTRAVENOUS
  Filled 2019-10-15: qty 4

## 2019-10-15 MED ORDER — METOLAZONE 5 MG PO TABS
5.0000 mg | ORAL_TABLET | Freq: Every day | ORAL | Status: DC
  Administered 2019-10-15 – 2019-10-16 (×2): 5 mg via ORAL
  Filled 2019-10-15 (×2): qty 1

## 2019-10-15 MED ORDER — INSULIN DETEMIR 100 UNIT/ML ~~LOC~~ SOLN
4.0000 [IU] | Freq: Two times a day (BID) | SUBCUTANEOUS | Status: DC
  Administered 2019-10-15 – 2019-10-19 (×9): 4 [IU] via SUBCUTANEOUS
  Filled 2019-10-15 (×10): qty 0.04

## 2019-10-15 MED ORDER — FUROSEMIDE 10 MG/ML IJ SOLN
40.0000 mg | Freq: Two times a day (BID) | INTRAMUSCULAR | Status: DC
  Administered 2019-10-15 – 2019-10-16 (×2): 40 mg via INTRAVENOUS
  Filled 2019-10-15 (×3): qty 4

## 2019-10-15 MED ORDER — MIDAZOLAM HCL 2 MG/2ML IJ SOLN: 1.0000 mg | Freq: Four times a day (QID) | INTRAMUSCULAR | Status: DC | PRN

## 2019-10-15 MED ORDER — INSULIN ASPART 100 UNIT/ML ~~LOC~~ SOLN
0.0000 [IU] | SUBCUTANEOUS | Status: DC
  Administered 2019-10-15 – 2019-10-16 (×5): 2 [IU] via SUBCUTANEOUS
  Administered 2019-10-16: 4 [IU] via SUBCUTANEOUS

## 2019-10-15 MED ORDER — MORPHINE SULFATE (PF) 2 MG/ML IV SOLN
2.0000 mg | INTRAVENOUS | Status: DC | PRN
  Administered 2019-10-15: 2 mg via INTRAVENOUS
  Filled 2019-10-15: qty 1

## 2019-10-15 NOTE — Progress Notes (Signed)
1 Day Post-Op Procedure(s) (LRB): CORONARY ARTERY BYPASS GRAFTING (CABG) x 2 (N/A) TRANSESOPHAGEAL ECHOCARDIOGRAM (TEE) (N/A) Endovein Harvest Of Greater Saphenous Vein (Right) Subjective:  Breathing comfortably and stable on nasal cannula off BiPAP Minimal chest tube output Neuro intact Maintaining sinus rhythm off pressors Plan to start diuresis, DC lines and mobilize patient. We will ask for physical therapy assistance  Objective: Vital signs in last 24 hours: Temp:  [95.4 F (35.2 C)-99.1 F (37.3 C)] 99.1 F (37.3 C) (04/17 0900) Pulse Rate:  [67-92] 86 (04/17 0900) Cardiac Rhythm: Normal sinus rhythm (04/17 0815) Resp:  [12-29] 20 (04/17 0900) BP: (94-171)/(60-88) 160/88 (04/17 0900) SpO2:  [92 %-100 %] 97 % (04/17 0900) Arterial Line BP: (116-203)/(42-80) 203/75 (04/17 0900) FiO2 (%):  [35 %-50 %] 35 % (04/17 0815) Weight:  [id number] kg] 111 kg (04/17 0500)  Hemodynamic parameters for last 24 hours: PAP: (25-64)/(11-41) 49/19 CO:  [3 L/min-4.8 L/min] 4.8 L/min CI:  [1.4 L/min/m2-2.2 L/min/m2] 2.2 L/min/m2  Intake/Output from previous day: 04/16 0701 - 04/17 0700 In: 3324.1 [I.V.:2032.2; Blood:248; IV Piggyback:1043.9] Out: 2301 [Urine:1540; Blood:451; Chest Tube:310] Intake/Output this shift: Total I/O In: 61.6 [I.V.:61.6] Out: 60 [Urine:60]       Exam    General- alert and comfortable    Neck- no JVD, no cervical adenopathy palpable, no carotid bruit   Lungs- clear without rales, wheezes   Cor- regular rate and rhythm, no murmur , gallop   Abdomen- soft, non-tender   Extremities - warm, non-tender, minimal edema   Neuro- oriented, appropriate, no focal weakness   Lab Results: Recent Labs    10/14/19 1800 10/14/19 1837 10/15/19 0140 10/15/19 0141 10/15/19 0612 10/15/19 0835  WBC 11.0*  --  14.4*  --   --   --   HGB 10.0*   < > 10.1*   < > 9.2* 10.2*  HCT 30.6*   < > 31.0*   < > 27.0* 30.0*  PLT 150  --  141*  --   --   --    < > = values in this  interval not displayed.   BMET:  Recent Labs    10/14/19 1800 10/14/19 1837 10/15/19 0140 10/15/19 0141 10/15/19 0612 10/15/19 0835  NA 138   < > 136   < > 142 140  K 4.3   < > 4.5   < > 3.9 4.3  CL 109  --  107  --   --   --   CO2 22  --  22  --   --   --   GLUCOSE 145*  --  134*  --   --   --   BUN 14  --  17  --   --   --   CREATININE 1.16  --  1.42*  --   --   --   CALCIUM 7.8*  --  8.1*  --   --   --    < > = values in this interval not displayed.    PT/INR:  Recent Labs    10/14/19 1225  LABPROT 16.1*  INR 1.3*   ABG    Component Value Date/Time   PHART 7.369 10/15/2019 0835   HCO3 21.5 10/15/2019 0835   TCO2 23 10/15/2019 0835   ACIDBASEDEF 3.0 (H) 10/15/2019 0835   O2SAT 90.0 10/15/2019 0835   CBG (last 3)  Recent Labs    10/15/19 0410 10/15/19 0611 10/15/19 0917  GLUCAP 123* 117* 129*    Assessment/Plan:  S/P Procedure(s) (LRB): CORONARY ARTERY BYPASS GRAFTING (CABG) x 2 (N/A) TRANSESOPHAGEAL ECHOCARDIOGRAM (TEE) (N/A) Endovein Harvest Of Greater Saphenous Vein (Right) Mobilize Diuresis Diabetes control See progression orders   LOS: 6 days    Corey [name] 10/15/2019

## 2019-10-15 NOTE — Progress Notes (Signed)
CT surgery p.m. Rounds  Patient is sitting up in chair on high flow nasal oxygen, saturation 95% Normal sinus rhythm Adequate urine output P.m. labs-creatinine 1.5 potassium 4.4 hematocrit 30

## 2019-10-15 NOTE — Progress Notes (Signed)
Progress Note  Patient Name: Corey [name] Date of Encounter: 10/15/2019  Primary Cardiologist:   Rozann Lesches, MD   Subjective   Difficulty ventilating after extubation following CABG.  Placed on BiPAP.  Feels OK this morning.  Usual chest soreness but no distress  Inpatient Medications    Scheduled Meds: . acetaminophen  1,000 mg Oral Q6H   Or  . acetaminophen (TYLENOL) oral liquid 160 mg/5 mL  1,000 mg Per Tube Q6H  . atorvastatin  80 mg Oral q1800  . bisacodyl  10 mg Oral Daily   Or  . bisacodyl  10 mg Rectal Daily  . Chlorhexidine Gluconate Cloth  6 each Topical Daily  . docusate sodium  200 mg Oral Daily  . metoCLOPramide (REGLAN) injection  10 mg Intravenous Q6H  . metoprolol tartrate  12.5 mg Oral BID   Or  . metoprolol tartrate  12.5 mg Per Tube BID  . [START ON 10/16/2019] pantoprazole  40 mg Oral Daily  . sodium chloride flush  3 mL Intravenous Q12H   Continuous Infusions: . sodium chloride 10 mL/hr at 10/15/19 0700  . sodium chloride    . sodium chloride 10 mL/hr at 10/14/19 1230  . albumin human 12.5 g (10/14/19 1348)  . dexmedetomidine (PRECEDEX) IV infusion Stopped (10/15/19 0640)  . famotidine (PEPCID) IV Stopped (10/14/19 1302)  . insulin 0.8 mL/hr at 10/15/19 0700  . lactated ringers    . lactated ringers 10 mL/hr at 10/15/19 0700  . lactated ringers 10 mL/hr at 10/15/19 0700  . levofloxacin (LEVAQUIN) IV    . nitroGLYCERIN 40 mcg/min (10/14/19 1624)  . phenylephrine (NEO-SYNEPHRINE) Adult infusion Stopped (10/14/19 1445)   PRN Meds: sodium chloride, albumin human, dextrose, hydrALAZINE, ipratropium-albuterol, lactated ringers, metoprolol tartrate, midazolam, morphine injection, ondansetron (ZOFRAN) IV, oxyCODONE, sodium chloride flush, traMADol   Vital Signs    Vitals:   10/15/19 0400 10/15/19 0500 10/15/19 0600 10/15/19 0700  BP: 108/71 99/61 126/68 109/68  Pulse: 86 86 80 75  Resp: 14 13 17  (!) 21  Temp: 98.1 F (36.7 C) 98.2 F  (36.8 C) 98.6 F (37 C) 98.6 F (37 C)  TempSrc: Core     SpO2: 94% 94% 95% 95%  Weight:  111 kg    Height:        Intake/Output Summary (Last 24 hours) at 10/15/2019 0815 Last data filed at 10/15/2019 0700 Gross per 24 hour  Intake 2924.12 ml  Output 2301 ml  Net 623.12 ml   Filed Weights   10/13/19 0335 10/14/19 0326 10/15/19 0500  Weight: 105.7 kg 105.4 kg 111 kg    Telemetry    NSR - Personally Reviewed  ECG    NSR, rate 74, LBBB, T wave inversion new compared to previous. - Personally Reviewed  Physical Exam   GEN: No acute distress.   Neck: No  JVD Cardiac: RRR, no murmurs, positive rubs, or gallops.  Respiratory:    Decreased breath sounds with decreased inspiratory effort with pleuritic rub GI: Soft, nontender, non-distended  MS:    Mild diffuse edema; No deformity. Neuro:  Nonfocal  Psych: Normal affect   Labs    Chemistry Recent Labs  Lab 10/09/19 0252 10/09/19 0252 10/10/19 [id number] 10/11/19 0640 10/14/19 0435 10/14/19 0803 10/14/19 1111 10/14/19 1115 10/14/19 1800 10/14/19 1837 10/15/19 0140 10/15/19 0141 10/15/19 0612  NA 140   < > 142   < > 139   < > 137   < > 138   < >  136 141 142  K 3.8   < > 3.9   < > 3.9   < > 4.6   < > 4.3   < > 4.5 4.3 3.9  CL 108   < > 108   < > 108   < > 103  --  109  --  107  --   --   CO2 24   < > 24   < > 20*  --   --   --  22  --  22  --   --   GLUCOSE 124*   < > 103*   < > 102*   < > 128*  --  145*  --  134*  --   --   BUN 17   < > 13   < > 16   < > 14  --  14  --  17  --   --   CREATININE 1.37*   < > 1.33*   < > 1.26*   < > 1.00  --  1.16  --  1.42*  --   --   CALCIUM 9.3   < > 9.1   < > 9.2  --   --   --  7.8*  --  8.1*  --   --   PROT 7.6  --  6.5  --   --   --   --   --   --   --   --   --   --   ALBUMIN 4.0  --  3.4*  --   --   --   --   --   --   --   --   --   --   AST 23  --  24  --   --   --   --   --   --   --   --   --   --   ALT 22  --  19  --   --   --   --   --   --   --   --   --   --   ALKPHOS  39  --  35*  --   --   --   --   --   --   --   --   --   --   BILITOT 0.5  --  0.9  --   --   --   --   --   --   --   --   --   --   GFRNONAA 48*   < > 50*   < > 53*  --   --   --  59*  --  46*  --   --   GFRAA 56*   < > 58*   < > >60  --   --   --  >60  --  53*  --   --   ANIONGAP 8   < > 10   < > 11  --   --   --  7  --  7  --   --    < > = values in this interval not displayed.     Hematology Recent Labs  Lab 10/14/19 1225 10/14/19 1711 10/14/19 1800 10/14/19 1837 10/15/19 0140 10/15/19 0141 10/15/19 0612  WBC 8.8  --  11.0*  --  14.4*  --   --  RBC 3.47*  --  3.67*  --  3.71*  --   --   HGB 9.3*   < > 10.0*   < > 10.1* 10.2* 9.2*  HCT 28.9*   < > 30.6*   < > 31.0* 30.0* 27.0*  MCV 83.3  --  83.4  --  83.6  --   --   MCH 26.8  --  27.2  --  27.2  --   --   MCHC 32.2  --  32.7  --  32.6  --   --   RDW 17.1*  --  17.1*  --  17.1*  --   --   PLT 120*  --  150  --  141*  --   --    < > = values in this interval not displayed.    Cardiac EnzymesNo results for input(s): TROPONINI in the last 168 hours. No results for input(s): TROPIPOC in the last 168 hours.   BNPNo results for input(s): BNP, PROBNP in the last 168 hours.   DDimer No results for input(s): DDIMER in the last 168 hours.   Radiology    DG Chest Port 1 View  Result Date: 10/14/2019 CLINICAL DATA:  Hypoxia. Status post coronary artery bypass grafting EXAM: PORTABLE CHEST 1 VIEW COMPARISON:  October 09, 2019 FINDINGS: Endotracheal tube tip is 5.1 cm above the carina. Nasogastric tube tip and side port are below the diaphragm. Swan-Ganz catheter tip is in the main pulmonary outflow tract directed toward the right. There is a left chest tube and a mediastinal drain. No pneumothorax. There is a small left pleural effusion with left base atelectasis. Lungs elsewhere are clear. Heart size and pulmonary vascularity are normal. No adenopathy. Status post median sternotomy. IMPRESSION: Tube and catheter positions as described  without pneumothorax. Small left pleural effusion with left base atelectasis. Lungs elsewhere clear. Heart size within normal limits. Electronically Signed   By: Lowella Grip III M.D.   On: 10/14/2019 13:20    Cardiac Studies   CATH  Diagnostic Dominance: Right  Echo 1. Left ventricular ejection fraction, by estimation, is 45 to 50%. The  left ventricle has mildly decreased function. The left ventricle  demonstrates global hypokinesis. There is mild concentric left ventricular  hypertrophy. Left ventricular diastolic  parameters are consistent with Grade I diastolic dysfunction (impaired  relaxation).  2. Right ventricular systolic function is normal. The right ventricular  size is normal. There is normal pulmonary artery systolic pressure.  3. The mitral valve is normal in structure. Trivial mitral valve  regurgitation. No evidence of mitral stenosis.  4. The aortic valve is normal in structure. Aortic valve regurgitation is  not visualized. No aortic stenosis is present.  5. Aortic dilatation noted. There is mild dilatation at the level of the  sinuses of Valsalva measuring 37 mm.  6. The inferior vena cava is normal in size with <50% respiratory  variability, suggesting right atrial pressure of 8 mmHg.   Patient Profile     84 y.o. 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  nOW STATUS POST CABG after cath with anatomy as above.    Assessment &  Plan    CAD/CABG:   Doing will post op day one with some difficulty with ventilation.  Encouraged pulmonary toilet with incentive spirometry.  Wean BiPAP as tlerated.    HTN:  BP is up.  PRN hydralazine.  On low dose metoprolol.  Would suggest restarting previous dose Coreg 12.5 bid.  Was also on Norvasc and lisinopril as an outpatient.  Holding ACE pending follow up of renal function.  Likely able to resume and switch to ARB or ARNI before discharge.    DYSLIPIDEMIA:  On high dose statin.  Continue.   CKD II:  Creat unchanged at 1.42.    For questions or updates, please contact Wade Please consult [email or web address] for contact info under Cardiology/STEMI.   Signed, Minus Breeding, MD  10/15/2019, 8:15 AM

## 2019-10-16 ENCOUNTER — Inpatient Hospital Stay (HOSPITAL_COMMUNITY): Payer: Medicare HMO

## 2019-10-16 LAB — BASIC METABOLIC PANEL
Anion gap: 8 (ref 5–15)
Anion gap: 13 (ref 5–15)
BUN: 27 mg/dL — ABNORMAL HIGH (ref 8–23)
BUN: 28 mg/dL — ABNORMAL HIGH (ref 8–23)
CO2: 24 mmol/L (ref 22–32)
CO2: 22 mmol/L (ref 22–32)
Calcium: 8.5 mg/dL — ABNORMAL LOW (ref 8.9–10.3)
Calcium: 8.8 mg/dL — ABNORMAL LOW (ref 8.9–10.3)
Chloride: 104 mmol/L (ref 98–111)
Chloride: 102 mmol/L (ref 98–111)
Creatinine, Ser: 1.68 mg/dL — ABNORMAL HIGH (ref 0.61–1.24)
Creatinine, Ser: 1.74 mg/dL — ABNORMAL HIGH (ref 0.61–1.24)
GFR calc Af Amer: 43 mL/min — ABNORMAL LOW (ref >60)
GFR calc Af Amer: 42 mL/min — ABNORMAL LOW (ref >60)
GFR calc non Af Amer: 38 mL/min — ABNORMAL LOW (ref >60)
GFR calc non Af Amer: 36 mL/min — ABNORMAL LOW (ref >60)
Glucose, Bld: 131 mg/dL — ABNORMAL HIGH (ref 70–99)
Glucose, Bld: 129 mg/dL — ABNORMAL HIGH (ref 70–99)
Potassium: 4.1 mmol/L (ref 3.5–5.1)
Potassium: 3.8 mmol/L (ref 3.5–5.1)
Sodium: 136 mmol/L (ref 135–145)
Sodium: 137 mmol/L (ref 135–145)

## 2019-10-16 LAB — CBC
HCT: 28.8 % — ABNORMAL LOW (ref 39.0–52.0)
Hemoglobin: 9.3 g/dL — ABNORMAL LOW (ref 13.0–17.0)
MCH: 27 pg (ref 26.0–34.0)
MCHC: 32.3 g/dL (ref 30.0–36.0)
MCV: 83.5 fL (ref 80.0–100.0)
Platelets: 121 10*3/uL — ABNORMAL LOW (ref 150–400)
RBC: 3.45 MIL/uL — ABNORMAL LOW (ref 4.22–5.81)
RDW: 17.4 % — ABNORMAL HIGH (ref 11.5–15.5)
WBC: 13.9 10*3/uL — ABNORMAL HIGH (ref 4.0–10.5)
nRBC: 0 % (ref 0.0–0.2)

## 2019-10-16 LAB — GLUCOSE, CAPILLARY
Glucose-Capillary: 100 mg/dL — ABNORMAL HIGH (ref 70–99)
Glucose-Capillary: 109 mg/dL — ABNORMAL HIGH (ref 70–99)
Glucose-Capillary: 115 mg/dL — ABNORMAL HIGH (ref 70–99)
Glucose-Capillary: 126 mg/dL — ABNORMAL HIGH (ref 70–99)
Glucose-Capillary: 129 mg/dL — ABNORMAL HIGH (ref 70–99)
Glucose-Capillary: 162 mg/dL — ABNORMAL HIGH (ref 70–99)

## 2019-10-16 MED ORDER — METOPROLOL TARTRATE 12.5 MG HALF TABLET
12.5000 mg | ORAL_TABLET | Freq: Once | ORAL | Status: AC
  Administered 2019-10-16: 11:00:00 12.5 mg via ORAL
  Filled 2019-10-16: qty 1

## 2019-10-16 MED ORDER — ENOXAPARIN SODIUM 40 MG/0.4ML ~~LOC~~ SOLN
40.0000 mg | SUBCUTANEOUS | Status: DC
  Administered 2019-10-16 – 2019-10-19 (×4): 40 mg via SUBCUTANEOUS
  Filled 2019-10-16 (×4): qty 0.4

## 2019-10-16 MED ORDER — METOPROLOL TARTRATE 25 MG PO TABS
25.0000 mg | ORAL_TABLET | Freq: Two times a day (BID) | ORAL | Status: DC
  Administered 2019-10-16: 25 mg via ORAL
  Filled 2019-10-16: qty 1

## 2019-10-16 MED ORDER — FUROSEMIDE 10 MG/ML IJ SOLN
40.0000 mg | Freq: Every day | INTRAMUSCULAR | Status: DC
  Administered 2019-10-17: 09:00:00 40 mg via INTRAVENOUS
  Filled 2019-10-16: qty 4

## 2019-10-16 MED ORDER — METOPROLOL TARTRATE 25 MG PO TABS: 25.0000 mg | ORAL_TABLET | Freq: Two times a day (BID) | ORAL | Status: DC

## 2019-10-16 MED ORDER — LISINOPRIL 20 MG PO TABS
20.0000 mg | ORAL_TABLET | Freq: Every day | ORAL | Status: DC
  Administered 2019-10-16: 20:00:00 20 mg via ORAL
  Filled 2019-10-16: qty 1

## 2019-10-16 MED ORDER — METOPROLOL TARTRATE 25 MG/10 ML ORAL SUSPENSION: 12.5000 mg | Freq: Two times a day (BID) | ORAL | Status: DC

## 2019-10-16 MED ORDER — INSULIN ASPART 100 UNIT/ML ~~LOC~~ SOLN: 0.0000 [IU] | Freq: Three times a day (TID) | SUBCUTANEOUS | Status: DC

## 2019-10-16 NOTE — Progress Notes (Signed)
CT surgery p.m. Rounds  Patient had good day remained on nasal cannula Ambulated in hallway Lines out Weight close to preop-we will reduce Lasix to daily Continue to wean nasal O2

## 2019-10-16 NOTE — Evaluation (Signed)
Physical Therapy Evaluation Patient Details Name: Corey [name] MRN: [id number] DOB: 05-Sep-1935 Today's Date: 10/16/2019   History of Present Illness  84 y.o. 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 Found to have NSTEMI Transferred to [facility] and is s/p CABGx2 with endoscopic vein harvest from R leg 10/14/19  Clinical Impression  PTA pt living with grandson in single story home with 3 steps to enter. Pt reports that daughters will stay with him 24/7 at discharge as needed. Pt reports independence in mobility and iADLs prior to admission. Pt is limited in safe mobility by increased O2 demand (see General Comments) and sternal pain in presence of generalized weakness. PT recommending HHPT level rehab at discharge to return pt to independent mobility. PT will continue to follow acutely.      Follow Up Recommendations Home health PT;Supervision/Assistance - 24 hour    Equipment Recommendations  3in1 (PT)    Recommendations for Other Services OT consult     Precautions / Restrictions Precautions Precautions: Sternal Precaution Comments: already has sternal precaution literature and was able to teach back use of hands on knees to stand  Restrictions Weight Bearing Restrictions: No      Mobility  Bed Mobility               General bed mobility comments: OOB in recliner on entry   Transfers Overall transfer level: Needs assistance Equipment used: None Transfers: Sit to/from Stand Sit to Stand: Min guard         General transfer comment: min guard for safety, good power up and steadying before reaching for EVA walker  Ambulation/Gait Ambulation/Gait  assistance: Min guard Gait Distance (Feet): 390 Feet Assistive device: (EVA Walker) Gait Pattern/deviations: Step-through pattern;Decreased step length - right;Decreased step length - left;Shuffle Gait velocity: slowed Gait velocity interpretation: <1.8 ft/sec, indicate of risk for recurrent falls General Gait Details: min guard for safety with slow, steady, shuffling gait       Balance Overall balance assessment: Mild deficits observed, not formally tested                                           Pertinent Vitals/Pain Pain Assessment: 0-10 Pain Score: 3  Pain Location: sternum Pain Descriptors / Indicators: Aching;Sore Pain Intervention(s): Limited activity within patient's tolerance;Monitored during session;Repositioned    Home Living Family/patient expects to be discharged to:: Private residence Living Arrangements: Other relatives(grandson) Available Help at Discharge: Family;Available 24 hours/day(daughters) Type of Home: House Home Access: Stairs to enter Entrance Stairs-Rails: None Entrance Stairs-Number of Steps: 3 Home Layout: One level Home Equipment: Walker - 2 wheels;Grab bars - tub/shower      Prior Function Level of Independence: Independent         Comments: although has been limited in community mobilization due to COVID        Extremity/Trunk Assessment   Upper Extremity Assessment Upper Extremity Assessment: Generalized weakness    Lower Extremity Assessment Lower Extremity Assessment: Generalized weakness       Communication   Communication: No difficulties  Cognition Arousal/Alertness: Awake/alert Behavior During Therapy: WFL for tasks assessed/performed Overall Cognitive Status: Within Functional Limits for tasks assessed                                        General Comments General comments (skin integrity, edema, etc.): Pt on 10 L HFNC on entry and able to maintain SaO2 >94%O2 throughout  mobilization, RN to try to titrate down supplemental O2 today, max HR with ambulation 118 bpm, BP after ambulation 175/83    Exercises Other Exercises Other Exercises: IS x10 max inhalation 1030mL, cues for long slow inhale   Assessment/Plan    PT Assessment Patient needs continued PT services  PT Problem List Decreased strength;Decreased activity tolerance;Decreased mobility;Cardiopulmonary status limiting activity;Pain       PT Treatment Interventions DME instruction;Gait training;Stair training;Functional mobility training;Therapeutic activities;Therapeutic exercise;Balance training;Patient/family education    PT Goals (Current goals can be found in the Care Plan section)  Acute Rehab PT Goals Patient Stated Goal: get back to [facility] PT Goal Formulation: With patient Time For Goal Achievement: 10/30/19 Potential to Achieve Goals: Good    Frequency Min 3X/week    AM-PAC PT "6 Clicks" Mobility  Outcome Measure Help needed turning from your back to your side while in a flat bed without using bedrails?: A Little Help needed moving from lying on your back to sitting on the side of a flat bed without using bedrails?: A Lot Help needed moving to and from a bed to a chair (including a wheelchair)?: A Little Help needed standing up from a chair using your arms (e.g., wheelchair or bedside chair)?: A Little Help needed to walk in hospital room?: A Little Help needed climbing 3-5 steps with a railing? : A Lot 6 Click Score: 16    End of Session Equipment Utilized During Treatment: Oxygen Activity Tolerance: Patient tolerated treatment well Patient left: in chair;with call bell/phone within reach;with family/visitor present Nurse Communication: Mobility status;Other (comment)(discussed titrating down supplemental O2) PT Visit Diagnosis: Other abnormalities of gait and mobility (R26.89);Muscle weakness (generalized) (M62.81);Difficulty in walking, not elsewhere classified  (R26.2);Pain Pain - part of body: (sternum)    Time: [id number] PT Time Calculation (min) (ACUTE ONLY): 23 min   Charges:   PT Evaluation $PT Eval Moderate Complexity: 1 Mod PT Treatments $Gait Training: 8-22 mins        Channin Agustin B. Migdalia Dk PT, DPT Acute Rehabilitation Services Pager [phone] Office [phone]   Mora 10/16/2019, 9:27 AM

## 2019-10-16 NOTE — Progress Notes (Signed)
2 Days Post-Op Procedure(s) (LRB): CORONARY ARTERY BYPASS GRAFTING (CABG) x 2 (N/A) TRANSESOPHAGEAL ECHOCARDIOGRAM (TEE) (N/A) Endovein Harvest Of Greater Saphenous Vein (Right) Subjective: Pulmonary status improved now on high flow nasal cannula breathing comfortably Patient has walked in hallway Maintaining sinus rhythm We will remove lines and watch patient's pulmonary status in ICU today  Objective: Vital signs in last 24 hours: Temp:  [97.9 F (36.6 C)-98.5 F (36.9 C)] 98.5 F (36.9 C) (04/18 0700) Pulse Rate:  [85-107] 94 (04/18 1000) Cardiac Rhythm: Normal sinus rhythm (04/18 0800) Resp:  [12-22] 20 (04/18 1000) BP: (112-177)/(56-83) 149/69 (04/18 1000) SpO2:  [90 %-100 %] 97 % (04/18 1000) Arterial Line BP: (173-202)/(57-70) 173/62 (04/17 1800) Weight:  [110.6 kg] 110.6 kg (04/18 0500)  Hemodynamic parameters for last 24 hours:    Intake/Output from previous day: 04/17 0701 - 04/18 0700 In: 896.7 [P.O.:360; I.V.:386.6; IV Piggyback:150] Out: [id number] [Urine:1760; Chest Tube:70] Intake/Output this shift: Total I/O In: 267.4 [P.O.:240; I.V.:27.4] Out: 890 [Urine:890]  Exam Alert neuro intact Lungs clear Sinus rhythm Extremities warm  Lab Results: Recent Labs    10/15/19 1706 10/16/19 0257  WBC 15.7* 13.9*  HGB 9.7* 9.3*  HCT 30.1* 28.8*  PLT 132* 121*   BMET:  Recent Labs    10/15/19 1706 10/16/19 0257  NA 137 136  K 4.4 4.1  CL 107 104  CO2 21* 24  GLUCOSE 149* 131*  BUN 24* 27*  CREATININE 1.50* 1.68*  CALCIUM 8.4* 8.5*    PT/INR:  Recent Labs    10/14/19 1225  LABPROT 16.1*  INR 1.3*   ABG    Component Value Date/Time   PHART 7.416 10/15/2019 1454   HCO3 22.9 10/15/2019 1454   TCO2 24 10/15/2019 1454   ACIDBASEDEF 1.0 10/15/2019 1454   O2SAT 94.0 10/15/2019 1454   CBG (last 3)  Recent Labs    10/16/19 0311 10/16/19 0809 10/16/19 1139  GLUCAP 129* 162* 109*    Assessment/Plan: S/P Procedure(s) (LRB): CORONARY ARTERY  BYPASS GRAFTING (CABG) x 2 (N/A) TRANSESOPHAGEAL ECHOCARDIOGRAM (TEE) (N/A) Endovein Harvest Of Greater Saphenous Vein (Right) Mobilize Diuresis d/c tubes/lines   LOS: 7 days    Corey [name] 10/16/2019

## 2019-10-17 ENCOUNTER — Encounter: Payer: Self-pay | Admitting: *Deleted

## 2019-10-17 ENCOUNTER — Inpatient Hospital Stay (HOSPITAL_COMMUNITY): Payer: Medicare HMO

## 2019-10-17 DIAGNOSIS — Z951 Presence of aortocoronary bypass graft: Secondary | ICD-10-CM

## 2019-10-17 LAB — BASIC METABOLIC PANEL
Anion gap: 11 (ref 5–15)
BUN: 30 mg/dL — ABNORMAL HIGH (ref 8–23)
CO2: 24 mmol/L (ref 22–32)
Calcium: 8.6 mg/dL — ABNORMAL LOW (ref 8.9–10.3)
Chloride: 102 mmol/L (ref 98–111)
Creatinine, Ser: 1.71 mg/dL — ABNORMAL HIGH (ref 0.61–1.24)
GFR calc Af Amer: 43 mL/min — ABNORMAL LOW (ref >60)
GFR calc non Af Amer: 37 mL/min — ABNORMAL LOW (ref >60)
Glucose, Bld: 105 mg/dL — ABNORMAL HIGH (ref 70–99)
Potassium: 3.7 mmol/L (ref 3.5–5.1)
Sodium: 137 mmol/L (ref 135–145)

## 2019-10-17 LAB — ECHO INTRAOPERATIVE TEE
Height: 68 in
Weight: 3716.8 oz

## 2019-10-17 LAB — CBC
HCT: 29.4 % — ABNORMAL LOW (ref 39.0–52.0)
Hemoglobin: 9.6 g/dL — ABNORMAL LOW (ref 13.0–17.0)
MCH: 27.1 pg (ref 26.0–34.0)
MCHC: 32.7 g/dL (ref 30.0–36.0)
MCV: 83.1 fL (ref 80.0–100.0)
Platelets: 110 10*3/uL — ABNORMAL LOW (ref 150–400)
RBC: 3.54 MIL/uL — ABNORMAL LOW (ref 4.22–5.81)
RDW: 17.2 % — ABNORMAL HIGH (ref 11.5–15.5)
WBC: 10.9 10*3/uL — ABNORMAL HIGH (ref 4.0–10.5)
nRBC: 0 % (ref 0.0–0.2)

## 2019-10-17 LAB — GLUCOSE, CAPILLARY: Glucose-Capillary: 104 mg/dL — ABNORMAL HIGH (ref 70–99)

## 2019-10-17 MED ORDER — ORAL CARE MOUTH RINSE
15.0000 mL | Freq: Two times a day (BID) | OROMUCOSAL | Status: DC
  Administered 2019-10-17 – 2019-10-19 (×5): 15 mL via OROMUCOSAL

## 2019-10-17 MED ORDER — METOPROLOL TARTRATE 25 MG PO TABS
37.5000 mg | ORAL_TABLET | Freq: Two times a day (BID) | ORAL | Status: DC
  Administered 2019-10-17 – 2019-10-19 (×5): 37.5 mg via ORAL
  Filled 2019-10-17 (×5): qty 1

## 2019-10-17 MED ORDER — POTASSIUM CHLORIDE CRYS ER 20 MEQ PO TBCR: 20.0000 meq | EXTENDED_RELEASE_TABLET | Freq: Every day | ORAL | Status: DC

## 2019-10-17 MED ORDER — POTASSIUM CHLORIDE CRYS ER 20 MEQ PO TBCR
20.0000 meq | EXTENDED_RELEASE_TABLET | Freq: Two times a day (BID) | ORAL | Status: AC
  Administered 2019-10-17 (×2): 20 meq via ORAL
  Filled 2019-10-17 (×2): qty 1

## 2019-10-17 MED ORDER — ~~LOC~~ CARDIAC SURGERY, PATIENT & FAMILY EDUCATION: Freq: Once | Status: DC

## 2019-10-17 MED ORDER — AMLODIPINE BESYLATE 5 MG PO TABS
5.0000 mg | ORAL_TABLET | Freq: Every day | ORAL | Status: DC
  Administered 2019-10-17 – 2019-10-19 (×3): 5 mg via ORAL
  Filled 2019-10-17 (×3): qty 1

## 2019-10-17 MED ORDER — SODIUM CHLORIDE 0.9% FLUSH
3.0000 mL | Freq: Two times a day (BID) | INTRAVENOUS | Status: DC
  Administered 2019-10-17 – 2019-10-18 (×4): 3 mL via INTRAVENOUS

## 2019-10-17 MED ORDER — SODIUM CHLORIDE 0.9 % IV SOLN: 250.0000 mL | INTRAVENOUS | Status: DC | PRN

## 2019-10-17 MED ORDER — SODIUM CHLORIDE 0.9% FLUSH: 3.0000 mL | INTRAVENOUS | Status: DC | PRN

## 2019-10-17 MED FILL — Thrombin (Recombinant) For Soln 20000 Unit: CUTANEOUS | Qty: 1 | Status: AC

## 2019-10-17 NOTE — Discharge Instructions (Signed)
Discharge Instructions:  1. You may shower, please wash incisions daily with soap and water and keep dry.  If you wish to cover wounds with dressing you may do so but please keep clean and change daily.  No tub baths or swimming until incisions have completely healed.  If your incisions become red or develop any drainage please call our office at [phone]  2. No Driving until cleared by Dr. Bartle's office and you are no longer using narcotic pain medications  3. Monitor your weight daily.. Please use the same scale and weigh at same time... If you gain 5-10 lbs in 48 hours with associated lower extremity swelling, please contact our office at [phone]  4. Fever of 101.5 for at least 24 hours with no source, please contact our office at [phone]   Prediabetes Eating Plan Prediabetes is a condition that causes blood sugar (glucose) levels to be higher than normal. This increases the risk for developing diabetes. In order to prevent diabetes from developing, your health care provider may recommend a diet and other lifestyle changes to help you:  Control your blood glucose levels.  Improve your cholesterol levels.  Manage your blood pressure. Your health care provider may recommend working with a diet and nutrition specialist (dietitian) to make a meal plan that is best for you. What are tips for following this plan? Lifestyle  Set weight loss goals with the help of your health care team. It is recommended that most people with prediabetes lose 7% of their current body weight.  Exercise for at least 30 minutes at least 5 days a week.  Attend a support group or seek ongoing support from a mental health counselor.  Take over-the-counter and prescription medicines only as told by your health care provider. Reading food labels  Read food labels to check the amount of fat, salt (sodium), and sugar in prepackaged foods. Avoid foods that have: ? Saturated fats. ? Trans  fats. ? Added sugars.  Avoid foods that have more than 300 milligrams (mg) of sodium per serving. Limit your daily sodium intake to less than 2,300 mg each day. Shopping  Avoid buying pre-made and processed foods. Cooking  Cook with olive oil. Do not use butter, lard, or ghee.  Bake, broil, grill, or boil foods. Avoid frying. Meal planning   Work with your dietitian to develop an eating plan that is right for you. This may include: ? Tracking how many calories you take in. Use a food diary, notebook, or mobile application to track what you eat at each meal. ? Using the glycemic index (GI) to plan your meals. The index tells you how quickly a food will raise your blood glucose. Choose low-GI foods. These foods take a longer time to raise blood glucose.  Consider following a Mediterranean diet. This diet includes: ? Several servings each day of fresh fruits and vegetables. ? Eating fish at least twice a week. ? Several servings each day of whole grains, beans, nuts, and seeds. ? Using olive oil instead of other fats. ? Moderate alcohol consumption. ? Eating small amounts of red meat and whole-fat dairy.  If you have high blood pressure, you may need to limit your sodium intake or follow a diet such as the DASH eating plan. DASH is an eating plan that aims to lower high blood pressure. What foods are recommended? The items listed below may not be a complete list. Talk with your dietitian about what dietary choices are best for you. Grains   Whole grains, such as whole-wheat or whole-grain breads, crackers, cereals, and pasta. Unsweetened oatmeal. Bulgur. Barley. Quinoa. Brown rice. Corn or whole-wheat flour tortillas or taco shells. Vegetables Lettuce. Spinach. Peas. Beets. Cauliflower. Cabbage. Broccoli. Carrots. Tomatoes. Squash. Eggplant. Herbs. Peppers. Onions. Cucumbers. Brussels sprouts. Fruits Berries. Bananas. Apples. Oranges. Grapes. Papaya. Mango. Pomegranate. Kiwi.  Grapefruit. Cherries. Meats and other protein foods Seafood. Poultry without skin. Lean cuts of pork and beef. Tofu. Eggs. Nuts. Beans. Dairy Low-fat or fat-free dairy products, such as yogurt, cottage cheese, and cheese. Beverages Water. Tea. Coffee. Sugar-free or diet soda. Seltzer water. Lowfat or no-fat milk. Milk alternatives, such as soy or almond milk. Fats and oils Olive oil. Canola oil. Sunflower oil. Grapeseed oil. Avocado. Walnuts. Sweets and desserts Sugar-free or low-fat pudding. Sugar-free or low-fat ice cream and other frozen treats. Seasoning and other foods Herbs. Sodium-free spices. Mustard. Relish. Low-fat, low-sugar ketchup. Low-fat, low-sugar barbecue sauce. Low-fat or fat-free mayonnaise. What foods are not recommended? The items listed below may not be a complete list. Talk with your dietitian about what dietary choices are best for you. Grains Refined white flour and flour products, such as bread, pasta, snack foods, and cereals. Vegetables Canned vegetables. Frozen vegetables with butter or cream sauce. Fruits Fruits canned with syrup. Meats and other protein foods Fatty cuts of meat. Poultry with skin. Breaded or fried meat. Processed meats. Dairy Full-fat yogurt, cheese, or milk. Beverages Sweetened drinks, such as sweet iced tea and soda. Fats and oils Butter. Lard. Ghee. Sweets and desserts Baked goods, such as cake, cupcakes, pastries, cookies, and cheesecake. Seasoning and other foods Spice mixes with added salt. Ketchup. Barbecue sauce. Mayonnaise. Summary  To prevent diabetes from developing, you may need to make diet and other lifestyle changes to help control blood sugar, improve cholesterol levels, and manage your blood pressure.  Set weight loss goals with the help of your health care team. It is recommended that most people with prediabetes lose 7 percent of their current body weight.  Consider following a Mediterranean diet that includes  plenty of fresh fruits and vegetables, whole grains, beans, nuts, seeds, fish, lean meat, low-fat dairy, and healthy oils. This information is not intended to replace advice given to you by your health care provider. Make sure you discuss any questions you have with your health care provider. Document Revised: 10/08/2018 Document Reviewed: 08/20/2016 Elsevier Patient Education  2020 [employer]   5. Activity- up as tolerated, please walk at least 3 times per day.  Avoid strenuous activity, no lifting, pushing, or pulling with your arms over 8-10 lbs for a minimum of 6 weeks  6. If any questions or concerns arise, please do not hesitate to contact our office at [phone] 

## 2019-10-17 NOTE — Progress Notes (Addendum)
Progress Note  Patient Name: Corey [name] Date of Encounter: 10/17/2019  Primary Cardiologist:   Rozann Lesches, MD   Subjective   He is up ambulating.  He is making laps around the 2H unit.  He will be transferring to 4 E. later today.  He denies chest pain.  He slept well last night.  Inpatient Medications    Scheduled Meds: . acetaminophen  1,000 mg Oral Q6H   Or  . acetaminophen (TYLENOL) oral liquid 160 mg/5 mL  1,000 mg Per Tube Q6H  . amLODipine  5 mg Oral Daily  . atorvastatin  80 mg Oral q1800  . bisacodyl  10 mg Oral Daily   Or  . bisacodyl  10 mg Rectal Daily  . Chlorhexidine Gluconate Cloth  6 each Topical Daily  . docusate sodium  200 mg Oral Daily  . enoxaparin (LOVENOX) injection  40 mg Subcutaneous Q24H  . furosemide  40 mg Intravenous Daily  . insulin detemir  4 Units Subcutaneous BID  . metoCLOPramide (REGLAN) injection  10 mg Intravenous Q6H  . metoprolol tartrate  37.5 mg Oral BID  . pantoprazole  40 mg Oral Daily  . potassium chloride  20 mEq Oral BID  . [START ON 10/18/2019] potassium chloride  20 mEq Oral Daily  . sodium chloride flush  3 mL Intravenous Q12H   Continuous Infusions: . sodium chloride Stopped (10/15/19 1017)  . sodium chloride    . sodium chloride 10 mL/hr at 10/14/19 1230  . lactated ringers Stopped (10/16/19 1249)  . phenylephrine (NEO-SYNEPHRINE) Adult infusion Stopped (10/14/19 1445)   PRN Meds: sodium chloride, hydrALAZINE, ipratropium-albuterol, metoprolol tartrate, ondansetron (ZOFRAN) IV, oxyCODONE, sodium chloride flush, traMADol   Vital Signs    Vitals:   10/17/19 0500 10/17/19 0700 10/17/19 0800 10/17/19 0815  BP: (!) 130/56 (!) 192/84 121/80   Pulse: 87 98 (!) 103   Resp: 20 (!) 28 (!) 25   Temp:  98.8 F (37.1 C)  98.5 F (36.9 C)  TempSrc:  Oral  Oral  SpO2: 97% 100% 97%   Weight: 108.7 kg     Height:        Intake/Output Summary (Last 24 hours) at 10/17/2019 0832 Last data filed at 10/17/2019  0800 Gross per 24 hour  Intake 285.65 ml  Output 2100 ml  Net -1814.35 ml   Filed Weights   10/15/19 0500 10/16/19 0500 10/17/19 0500  Weight: 111 kg 110.6 kg 108.7 kg    Telemetry    NSR - Personally Reviewed  ECG    NSR, rate 74, LBBB, T wave inversion new compared to previous. - Personally Reviewed  Physical Exam   GEN:  He is up ambulating in the hall. Neck:  Neck veins are flat while standing. Cardiac:  He is in sinus rhythm with regular rhythm.  No gallop or rub is heard. Respiratory:    Decreased breath sounds on the right.  Lungs are otherwise clear. GI: Soft, nontender, non-distended  MS:  Bilateral ankle edema Neuro:  Nonfocal  Psych: Normal affect   Labs    Chemistry Recent Labs  Lab 10/16/19 0257 10/16/19 1855 10/17/19 0330  NA 136 137 137  K 4.1 3.8 3.7  CL 104 102 102  CO2 24 22 24   GLUCOSE 131* 129* 105*  BUN 27* 28* 30*  CREATININE 1.68* 1.74* 1.71*  CALCIUM 8.5* 8.8* 8.6*  GFRNONAA 38* 36* 37*  GFRAA 43* 42* 43*  ANIONGAP 8 13 11  Hematology Recent Labs  Lab 10/15/19 1706 10/16/19 0257 10/17/19 0330  WBC 15.7* 13.9* 10.9*  RBC 3.61* 3.45* 3.54*  HGB 9.7* 9.3* 9.6*  HCT 30.1* 28.8* 29.4*  MCV 83.4 83.5 83.1  MCH 26.9 27.0 27.1  MCHC 32.2 32.3 32.7  RDW 17.4* 17.4* 17.2*  PLT 132* 121* 110*    Cardiac EnzymesNo results for input(s): TROPONINI in the last 168 hours. No results for input(s): TROPIPOC in the last 168 hours.   BNPNo results for input(s): BNP, PROBNP in the last 168 hours.   DDimer No results for input(s): DDIMER in the last 168 hours.   Radiology    DG Chest Port 1 View  Result Date: 10/16/2019 CLINICAL DATA:  Chest pain, history of CABG EXAM: PORTABLE CHEST 1 VIEW COMPARISON:  Chest radiograph from one day prior. FINDINGS: Intact sternotomy wires. Right internal jugular central venous sheath terminates in the high right SVC. Stable cardiomediastinal silhouette with mild cardiomegaly. No pneumothorax. No  pleural effusion. No overt pulmonary edema. Mild-to-moderate bibasilar atelectasis, similar. IMPRESSION: 1. No pneumothorax. 2. Stable mild cardiomegaly without overt pulmonary edema. 3. Stable mild-to-moderate bibasilar atelectasis. Electronically Signed   By: Ilona Sorrel M.D.   On: 10/16/2019 10:21    Cardiac Studies   CATH  Diagnostic Dominance: Right  Echo 1. Left ventricular ejection fraction, by estimation, is 45 to 50%. The  left ventricle has mildly decreased function. The left ventricle  demonstrates global hypokinesis. There is mild concentric left ventricular  hypertrophy. Left ventricular diastolic  parameters are consistent with Grade I diastolic dysfunction (impaired  relaxation).  2. Right ventricular systolic function is normal. The right ventricular  size is normal. There is normal pulmonary artery systolic pressure.  3. The mitral valve is normal in structure. Trivial mitral valve  regurgitation. No evidence of mitral stenosis.  4. The aortic valve is normal in structure. Aortic valve regurgitation is  not visualized. No aortic stenosis is present.  5. Aortic dilatation noted. There is mild dilatation at the level of the  sinuses of Valsalva measuring 37 mm.  6. The inferior vena cava is normal in size with <50% respiratory  variability, suggesting right atrial pressure of 8 mmHg.   Patient Profile     84 y.o. 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 Cardiac cath noted above demonstrated ostial LAD disease leading to coronary artery bypass grafting x2 with LIMA to LAD and diagonal saphenous vein graft on  10/14/2019.  Assessment & Plan    1. Native CAD with ostial left anterior descending as well as 75% stenosis in the mid LAD: Treated with CABG using  LIMA to mid LAD and saphenous vein graft to the diagonal.  He is recovering well.  Consider aspirin and Plavix combination for 12 months then drop Plavix. 2. Acute on chronic kidney disease stage III: The creatinine peaked at 1.74 on 10/16/2019.  Today's lab is not yet returned.  Would hold on instituting renin-angiotensin-aldosterone system blockade until kidney function is stable.  This may not be until return for outpatient follow-up.  Continue to follow kidney function. 3. Hyperlipidemia: Target LDL less than 70.  May even consider more aggressive  LDL lowering to 55. 4. Essential hypertension: Considering kidney function, amlodipine, beta-blocker therapy (was previously on carvedilol), and diuretic therapy seem to be adequate at this time. 5. Chronic combined systolic and diastolic heart failure: Presurgery, EF was lower normal to mildly depressed.  This should recover with revascularization and appropriate medical therapy.  Carvedilol (currently on metoprolol which should be switched to carvedilol prior to discharge) and possibly an Bertell Maria should be instituted.  Would hold on Arni/ARB therapy until kidney function stabilizes (this will possibly be as an outpatient).  For questions or updates, please contact [place] Please consult [email or web address] for contact info under Cardiology/STEMI.   Signed, Sinclair Grooms, MD  10/17/2019, 8:32 AM

## 2019-10-17 NOTE — Progress Notes (Signed)
Patient arrived to 4E room 19 at this time. Telemetry applied and CCMD notified. V/s and assessment complete. Pacing wires clean dry and intact. Patient oriented to room and how to call nurse with any needs.   Emelda Fear, RN

## 2019-10-17 NOTE — Progress Notes (Addendum)
TCTS DAILY ICU PROGRESS NOTE                   [address]            [place] [address]          [id number]   3 Days Post-Op Procedure(s) (LRB): CORONARY ARTERY BYPASS GRAFTING (CABG) x 2 (N/A) TRANSESOPHAGEAL ECHOCARDIOGRAM (TEE) (N/A) Endovein Harvest Of Greater Saphenous Vein (Right)  Total Length of Stay:  LOS: 8 days   Subjective: Patient eating breakfast. He is passing flatus but no bowel movement yet.  Objective: Vital signs in last 24 hours: Temp:  [97.8 F (36.6 C)-98.3 F (36.8 C)] 98.3 F (36.8 C) (04/19 0300) Pulse Rate:  [83-107] 87 (04/19 0500) Cardiac Rhythm: Normal sinus rhythm (04/19 0400) Resp:  [15-25] 20 (04/19 0500) BP: (105-175)/(51-83) 130/56 (04/19 0500) SpO2:  [92 %-99 %] 97 % (04/19 0500) Weight:  [108.7 kg] 108.7 kg (04/19 0500)  Filed Weights   10/15/19 0500 10/16/19 0500 10/17/19 0500  Weight: 111 kg 110.6 kg 108.7 kg    Weight change: -1.9 kg      Intake/Output from previous day: 04/18 0701 - 04/19 0700 In: 535.6 [P.O.:480; I.V.:55.6] Out: 2200 [Urine:2200]  Intake/Output this shift: No intake/output data recorded.  Current Meds: Scheduled Meds: . acetaminophen  1,000 mg Oral Q6H   Or  . acetaminophen (TYLENOL) oral liquid 160 mg/5 mL  1,000 mg Per Tube Q6H  . atorvastatin  80 mg Oral q1800  . bisacodyl  10 mg Oral Daily   Or  . bisacodyl  10 mg Rectal Daily  . Chlorhexidine Gluconate Cloth  6 each Topical Daily  . docusate sodium  200 mg Oral Daily  . enoxaparin (LOVENOX) injection  40 mg Subcutaneous Q24H  . furosemide  40 mg Intravenous Daily  . insulin aspart  0-24 Units Subcutaneous TID WC & HS  . insulin detemir  4 Units Subcutaneous BID  . lisinopril  20 mg Oral Daily  . metoCLOPramide (REGLAN) injection  10 mg Intravenous Q6H  . metolazone  5 mg Oral Daily  . metoprolol tartrate  25 mg Oral BID  . pantoprazole  40 mg Oral Daily  . sodium chloride flush  3 mL Intravenous Q12H   Continuous  Infusions: . sodium chloride Stopped (10/15/19 1017)  . sodium chloride    . sodium chloride 10 mL/hr at 10/14/19 1230  . lactated ringers Stopped (10/16/19 1249)  . nitroGLYCERIN 40 mcg/min (10/14/19 1624)  . phenylephrine (NEO-SYNEPHRINE) Adult infusion Stopped (10/14/19 1445)   PRN Meds:.sodium chloride, hydrALAZINE, ipratropium-albuterol, metoprolol tartrate, midazolam, ondansetron (ZOFRAN) IV, oxyCODONE, sodium chloride flush, traMADol  General appearance: alert, cooperative and no distress Neurologic: intact Heart: Slightly tachycardic Lungs: Slightly diminished bibasilar breath sounds Abdomen: Soft, non tender, bowel sounds present Extremities: Mild LE edema Wound: Sternal dressing removed and wound is clean and dry. RLE wound is clean and dery  Lab Results: CBC: Recent Labs    10/16/19 0257 10/17/19 0330  WBC 13.9* 10.9*  HGB 9.3* 9.6*  HCT 28.8* 29.4*  PLT 121* 110*   BMET:  Recent Labs    10/16/19 1855 10/17/19 0330  NA 137 137  K 3.8 3.7  CL 102 102  CO2 22 24  GLUCOSE 129* 105*  BUN 28* 30*  CREATININE 1.74* 1.71*  CALCIUM 8.8* 8.6*    CMET: Lab Results  Component Value Date   WBC 10.9 (H) 10/17/2019   HGB 9.6 (L) 10/17/2019  HCT 29.4 (L) 10/17/2019   PLT 110 (L) 10/17/2019   GLUCOSE 105 (H) 10/17/2019   CHOL 148 10/10/2019   TRIG 110 10/10/2019   HDL 30 (L) 10/10/2019   LDLCALC 96 10/10/2019   ALT 19 10/10/2019   AST 24 10/10/2019   NA 137 10/17/2019   K 3.7 10/17/2019   CL 102 10/17/2019   CREATININE 1.71 (H) 10/17/2019   BUN 30 (H) 10/17/2019   CO2 24 10/17/2019   PSA 0.63 08/10/2009   INR 1.3 (H) 10/14/2019   HGBA1C 6.3 (H) 10/15/2019      PT/INR:  Recent Labs    10/14/19 1225  LABPROT 16.1*  INR 1.3*   Radiology: No results found.   Assessment/Plan: S/P Procedure(s) (LRB): CORONARY ARTERY BYPASS GRAFTING (CABG) x 2 (N/A) TRANSESOPHAGEAL ECHOCARDIOGRAM (TEE) (N/A) Endovein Harvest Of Greater Saphenous Vein (Right)    1. CV-ST this am and hypertensive. On Lopressor 25 mg bid but will increase to 37.5 mg bid and restart Amlodipine. 2. Pulmonary-On 4 liters of oxygen via [place]. Wean as able. CXR this am appears stable (no pneumothorax, mild cardiomegaly, bibasilar atelectasis). Encourage incentive spirometer 3. Volume overload-Lasix 40 mg IV daily and Zaroxolyn 5 mg daily. Will stop Zaroxolyn as creatinine remains elevated 4. CBGs 115/100/104. Pre op HGA1C 6.3. He likely has pre diabetes and will need follow up with medical doctor after discharge. Stop accu checks and SS PRN 5. Expected post op blood loss anemia-H and H this am stable at 9.6 and 29.4 6. Thrombocytopenia-platelets this am slightly decreased to 110,000 7. Supplement potassium 8. Creatinine slightly decreased to 1.71 this am. Creatinine upon admission 1.37. He is on ACE so will stop until creatinine decreased 9. LOC constipation 10. Transfer to 4E  Donielle Liston Alba PA-C 10/17/2019 7:02 AM    Chart reviewed, patient examined, agree with above. He looks good overall. CXR shows left basilar atelectasis. Continue IS, ambulation. Creat up a little with diuresis but should settle out with decrease in diuretic. Hold ACE I until creat back to baseline.

## 2019-10-17 NOTE — Anesthesia Postprocedure Evaluation (Signed)
Anesthesia Post Note  Patient: Corey [name]  Procedure(s) Performed: CORONARY ARTERY BYPASS GRAFTING (CABG) x 2 (N/A Chest) TRANSESOPHAGEAL ECHOCARDIOGRAM (TEE) (N/A ) Endovein Harvest Of Greater Saphenous Vein (Right Leg Upper)     Patient location during evaluation: SICU Anesthesia Type: General Level of consciousness: sedated Pain management: pain level controlled Vital Signs Assessment: post-procedure vital signs reviewed and stable Respiratory status: patient remains intubated per anesthesia plan Cardiovascular status: stable Postop Assessment: no apparent nausea or vomiting Anesthetic complications: no    Last Vitals:  Vitals:   10/17/19 0500 10/17/19 0700  BP: (!) 130/56   Pulse: 87   Resp: 20   Temp:  37.1 C  SpO2: 97%     Last Pain:  Vitals:   10/17/19 0700  TempSrc: Oral  PainSc:                  [place]

## 2019-10-17 NOTE — Plan of Care (Signed)
Continue to monitor

## 2019-10-17 NOTE — Evaluation (Signed)
Occupational Therapy Evaluation Patient Details Name: Corey [name] MRN: [id number] DOB: 1936/05/11 Today's Date: 10/17/2019    History of Present Illness 84 y.o. male admitted with NSTEMI s/p CABG x 2. PMHx:  CAD, LBBB, HTN, CKD stage II, mild LV dysfunction and HLD   Clinical Impression   Pt PTA: Pt living with family; daughters to assist as needed. Pt supervisionA advised for transfers and ADL if standing; pt certain that daughter will assist as needed. Pt currently standing x10 mins with ADL tasks, 97% O2 on RA and 87 BPM. Pt able to use figure 4 technique for LB ADL tasks. Energy conservation techniques in detail with pt's brother in room for teach back. Pt at functional baseline for mobility with no AD and ADL in standing in room. Pt does not require continued OT skilled services. OT signing off. Thank you.      Follow Up Recommendations  Home health OT;Supervision - Intermittent    Equipment Recommendations  None recommended by OT    Recommendations for Other Services       Precautions / Restrictions Precautions Precautions: Sternal Precaution Booklet Issued: No Precaution Comments: Pt able to state no pushing/pulling with increased time; proper hand placement for bed mobility and transfers Restrictions Weight Bearing Restrictions: Yes Other Position/Activity Restrictions: sternal precautions      Mobility Bed Mobility               General bed mobility comments: OOB in recliner on entry   Transfers Overall transfer level: Needs assistance Equipment used: None Transfers: Sit to/from Stand Sit to Stand: Supervision         General transfer comment: Pt able to stand with proper hand placement    Balance Overall balance assessment: Mild deficits observed, not formally tested                                         ADL either performed or assessed with clinical judgement   ADL Overall ADL's : At baseline                                       General ADL Comments: SupervisionA advised for transfers and ADL if standing; pt certain that daughter will assist as needed. Pt stand 10 mins with ADL tasks, 97% O2 on RA and 87 BPM. Pt able to use figure 4 technique for LB ADL tasks.     Vision Baseline Vision/History: No visual deficits Patient Visual Report: No change from baseline Vision Assessment?: No apparent visual deficits     Perception     Praxis      Pertinent Vitals/Pain Pain Assessment: No/denies pain     Hand Dominance     Extremity/Trunk Assessment Upper Extremity Assessment Upper Extremity Assessment: Overall WFL for tasks assessed   Lower Extremity Assessment Lower Extremity Assessment: Overall WFL for tasks assessed   Cervical / Trunk Assessment Cervical / Trunk Assessment: Normal   Communication Communication Communication: No difficulties   Cognition Arousal/Alertness: Awake/alert Behavior During Therapy: WFL for tasks assessed/performed Overall Cognitive Status: Within Functional Limits for tasks assessed                                     General Comments  97% O2 on RA and 87 BPM.    Exercises     Shoulder Instructions      Home Living Family/patient expects to be discharged to:: Private residence Living Arrangements: Other relatives Available Help at Discharge: Family;Available 24 hours/day Type of Home: House Home Access: Stairs to enter [employer] of Steps: 3 Entrance Stairs-Rails: None Home Layout: One level     Bathroom Shower/Tub: Teacher, early years/pre: Standard Bathroom Accessibility: Yes   Home Equipment: Environmental consultant - 2 wheels;Grab bars - tub/shower          Prior Functioning/Environment Level of Independence: Independent        Comments: although has been limited in community mobilization due to COVID        OT Problem List: Decreased activity tolerance      OT Treatment/Interventions:       OT Goals(Current goals can be found in the care plan section) Acute Rehab OT Goals Patient Stated Goal: get back to [facility]  OT Frequency:     Barriers to D/C:            Co-evaluation              AM-PAC OT "6 Clicks" Daily Activity     Outcome Measure Help from another person eating meals?: None Help from another person taking care of personal grooming?: None Help from another person toileting, which includes using toliet, bedpan, or urinal?: None Help from another person bathing (including washing, rinsing, drying)?: A Little Help from another person to put on and taking off regular upper body clothing?: None Help from another person to put on and taking off regular lower body clothing?: None 6 Click Score: 23   End of Session Equipment Utilized During Treatment: Gait belt Nurse Communication: Mobility status  Activity Tolerance: Patient tolerated treatment well Patient left: in chair;with call bell/phone within reach;with family/visitor present  OT Visit Diagnosis: Unsteadiness on feet (R26.81);Muscle weakness (generalized) (M62.81)                Time: 0938-1000 OT Time Calculation (min): 22 min Charges:  OT General Charges $OT Visit: 1 Visit OT Evaluation $OT Eval Moderate Complexity: 1 Mod  Jefferey Pica, OTR/L Acute Rehabilitation Services Pager: [phone] Office: [phone]    Audie Pinto 10/17/2019, 5:15 PM

## 2019-10-17 NOTE — Progress Notes (Signed)
CARDIAC REHAB PHASE I   PRE:  Rate/Rhythm: 91 SR    BP: sitting 123/67    SaO2: 99 2L, 90 RA  MODE:  Ambulation: 370 ft   POST:  Rate/Rhythm: 100 ST    BP: sitting 142/77     SaO2: 97 2L  Pt moving well although needed verbal cue how to roll out of bed. Walked without RW, steady. Attempted RA however SaO2 down to 83 RA fairly suddenly after 300 ft. Increased to 2L and slowly up with pursed lip breathing. No c/o although pt is congested. Encouraged IS. This was his fourth walk today. [place], ACSM 10/17/2019 3:06 PM

## 2019-10-17 NOTE — Progress Notes (Signed)
Patient indicates that he uses O2 at night and not PPV.

## 2019-10-17 NOTE — Progress Notes (Signed)
Physical Therapy Treatment Patient Details Name: Corey [name] MRN: [id number] DOB: September 18, 1935 Today's Date: 10/17/2019    History of Present Illness 84 y.o. male admitted with NSTEMI s/p CABG x 2. PMHx:  CAD, LBBB, HTN, CKD stage II, mild LV dysfucntion and HLD    PT Comments    Pt in recliner on arrival having already walked 1x today and very happy to walk again. Pt is a retired Warden/ranger and enjoys golf and working out at [employer]. Pt able to walk laps around the unit on 2L with sats 90-93% and HR 94-108. Pt educated for continued progression of gait, need to practice stairs next session and continued HEP.  Pre BP 82/65 (73) Post 120/69 (84)    Follow Up Recommendations  Home health PT;Supervision/Assistance - 24 hour     Equipment Recommendations  3in1 (PT)    Recommendations for Other Services       Precautions / Restrictions Precautions Precautions: Sternal Precaution Booklet Issued: No Precaution Comments: pt able to demonstrate correct hand placement, unable to verbalize all precautions with education x 3 during session    Mobility  Bed Mobility               General bed mobility comments: OOB in recliner on entry   Transfers Overall transfer level: Needs assistance   Transfers: Sit to/from Stand Sit to Stand: Supervision         General transfer comment: supervision for hand placement not to push to scoot but appropriately using hands on thighs to stand  Ambulation/Gait Ambulation/Gait assistance: Min guard Gait Distance (Feet): 800 Feet Assistive device: Rolling walker (2 wheeled) Gait Pattern/deviations: Step-through pattern;Decreased stride length;Trunk flexed   Gait velocity interpretation: >2.62 ft/sec, indicative of community ambulatory General Gait Details: cues for posture and position in RW. Pt with initial partial posterior LOB with gait when talking to RN in room, tactile cues to correct   Stairs              Wheelchair Mobility    Modified Rankin (Stroke Patients Only)       Balance Overall balance assessment: Mild deficits observed, not formally tested                                          Cognition Arousal/Alertness: Awake/alert Behavior During Therapy: WFL for tasks assessed/performed Overall Cognitive Status: Within Functional Limits for tasks assessed                                        Exercises General Exercises - Lower Extremity Long Arc Quad: AROM;Both;Seated;15 reps Hip Flexion/Marching: AROM;Both;Seated;15 reps    General Comments        Pertinent Vitals/Pain Pain Assessment: No/denies pain    Home Living                      Prior Function            PT Goals (current goals can now be found in the care plan section) Progress towards PT goals: Progressing toward goals    Frequency           PT Plan Current plan remains appropriate    Co-evaluation              AM-PAC  PT "6 Clicks" Mobility   Outcome Measure  Help needed turning from your back to your side while in a flat bed without using bedrails?: None Help needed moving from lying on your back to sitting on the side of a flat bed without using bedrails?: A Little Help needed moving to and from a bed to a chair (including a wheelchair)?: A Little Help needed standing up from a chair using your arms (e.g., wheelchair or bedside chair)?: A Little Help needed to walk in hospital room?: A Little Help needed climbing 3-5 steps with a railing? : A Little 6 Click Score: 19    End of Session Equipment Utilized During Treatment: Oxygen Activity Tolerance: Patient tolerated treatment well Patient left: in chair;with call bell/phone within reach;with family/visitor present Nurse Communication: Mobility status PT Visit Diagnosis: Other abnormalities of gait and mobility (R26.89);Difficulty in walking, not elsewhere classified (R26.2)      Time: [id number] PT Time Calculation (min) (ACUTE ONLY): 21 min  Charges:  $Gait Training: 8-22 mins                     Bayard Males, PT Acute Rehabilitation Services Pager: [phone] Office: [place] 10/17/2019, 12:53 PM

## 2019-10-18 ENCOUNTER — Inpatient Hospital Stay (HOSPITAL_COMMUNITY): Payer: Medicare HMO

## 2019-10-18 DIAGNOSIS — I25810 Atherosclerosis of coronary artery bypass graft(s) without angina pectoris: Secondary | ICD-10-CM

## 2019-10-18 LAB — CBC
HCT: 26.9 % — ABNORMAL LOW (ref 39.0–52.0)
Hemoglobin: 8.7 g/dL — ABNORMAL LOW (ref 13.0–17.0)
MCH: 26.4 pg (ref 26.0–34.0)
MCHC: 32.3 g/dL (ref 30.0–36.0)
MCV: 81.8 fL (ref 80.0–100.0)
Platelets: 132 10*3/uL — ABNORMAL LOW (ref 150–400)
RBC: 3.29 MIL/uL — ABNORMAL LOW (ref 4.22–5.81)
RDW: 17 % — ABNORMAL HIGH (ref 11.5–15.5)
WBC: 8.6 10*3/uL (ref 4.0–10.5)
nRBC: 0 % (ref 0.0–0.2)

## 2019-10-18 LAB — BASIC METABOLIC PANEL
Anion gap: 10 (ref 5–15)
BUN: 33 mg/dL — ABNORMAL HIGH (ref 8–23)
CO2: 24 mmol/L (ref 22–32)
Calcium: 8.5 mg/dL — ABNORMAL LOW (ref 8.9–10.3)
Chloride: 104 mmol/L (ref 98–111)
Creatinine, Ser: 1.66 mg/dL — ABNORMAL HIGH (ref 0.61–1.24)
GFR calc Af Amer: 44 mL/min — ABNORMAL LOW (ref >60)
GFR calc non Af Amer: 38 mL/min — ABNORMAL LOW (ref >60)
Glucose, Bld: 98 mg/dL (ref 70–99)
Potassium: 3.6 mmol/L (ref 3.5–5.1)
Sodium: 138 mmol/L (ref 135–145)

## 2019-10-18 MED ORDER — FERROUS SULFATE 325 (65 FE) MG PO TABS
325.0000 mg | ORAL_TABLET | Freq: Every day | ORAL | Status: DC
  Administered 2019-10-18 – 2019-10-19 (×2): 325 mg via ORAL
  Filled 2019-10-18 (×2): qty 1

## 2019-10-18 MED ORDER — POTASSIUM CHLORIDE CRYS ER 20 MEQ PO TBCR
20.0000 meq | EXTENDED_RELEASE_TABLET | Freq: Every day | ORAL | Status: DC
  Administered 2019-10-19: 20 meq via ORAL
  Filled 2019-10-18: qty 1

## 2019-10-18 MED ORDER — FUROSEMIDE 40 MG PO TABS
40.0000 mg | ORAL_TABLET | Freq: Every day | ORAL | Status: DC
  Administered 2019-10-18 – 2019-10-19 (×2): 40 mg via ORAL
  Filled 2019-10-18 (×2): qty 1

## 2019-10-18 MED ORDER — POTASSIUM CHLORIDE CRYS ER 20 MEQ PO TBCR
30.0000 meq | EXTENDED_RELEASE_TABLET | Freq: Two times a day (BID) | ORAL | Status: AC
  Administered 2019-10-18 (×2): 30 meq via ORAL
  Filled 2019-10-18 (×2): qty 1

## 2019-10-18 NOTE — Progress Notes (Signed)
EPW removed as ordered and per protocol, all ends intact. Left ventricle wire met with some resistance but then was removed with out difficulty. Patient reminded to lie supine approximately one hour. Patient BP 133/65 heart rate 86 on monitor. Will monitor patient. Yee Gangi, Bettina Gavia RN

## 2019-10-18 NOTE — Progress Notes (Signed)
Patient ambulated in hallway with family on room air. Aryahi Denzler, Bettina Gavia  RN

## 2019-10-18 NOTE — Progress Notes (Signed)
Patient ambulated in hallway with family. Maveric Debono, Bettina Gavia RN

## 2019-10-18 NOTE — Progress Notes (Addendum)
      [address]       [place] [address]             [phone]        4 Days Post-Op Procedure(s) (LRB): CORONARY ARTERY BYPASS GRAFTING (CABG) x 2 (N/A) TRANSESOPHAGEAL ECHOCARDIOGRAM (TEE) (N/A) Endovein Harvest Of Greater Saphenous Vein (Right)  Subjective: Patient had a bowel movement yesterday. He has no specific complaint this am.  Objective: Vital signs in last 24 hours: Temp:  [97.9 F (36.6 C)-98.7 F (37.1 C)] 98.6 F (37 C) (04/20 0418) Pulse Rate:  [74-103] 89 (04/20 0418) Cardiac Rhythm: Normal sinus rhythm (04/19 2057) Resp:  [16-25] 20 (04/20 0418) BP: (82-153)/(51-80) 138/67 (04/20 0418) SpO2:  [93 %-99 %] 93 % (04/20 0418) Weight:  ST:[id number] kg] 107 kg (04/20 0418)  Pre op weight 105.4 kg Current Weight  10/18/19 107 kg      Intake/Output from previous day: 04/19 0701 - 04/20 0700 In: 0  Out: 651 [Urine:650; Stool:1]   Physical Exam:  Cardiovascular: RRR, no murmurs, gallops, or rubs. Pulmonary: Clear to auscultation bilaterally; no rales, wheezes, or rhonchi. Abdomen: Soft, non tender, bowel sounds present. Extremities: Mild bilateral lower extremity edema. Wounds: Clean and dry.  No erythema or signs of infection.  Lab Results: CBC: Recent Labs    10/17/19 0330 10/18/19 0410  WBC 10.9* 8.6  HGB 9.6* 8.7*  HCT 29.4* 26.9*  PLT 110* 132*   BMET:  Recent Labs    10/17/19 0330 10/18/19 0410  NA 137 138  K 3.7 3.6  CL 102 104  CO2 24 24  GLUCOSE 105* 98  BUN 30* 33*  CREATININE 1.71* 1.66*  CALCIUM 8.6* 8.5*    PT/INR:  Lab Results  Component Value Date   INR 1.3 (H) 10/14/2019   INR 1.1 10/09/2019   INR 1.0 10/09/2019   ABG:  INR: Will add last result for INR, ABG once components are confirmed Will add last 4 CBG results once components are confirmed  Assessment/Plan:  1. CV-SR with HR in the 80's this am and less hypertensive. On Lopressor  37.5 mg bid and Amlodipine 5 mg daily as taken prior to  surgery. 2. Pulmonary-On 3 liters of oxygen via [place]. Wean as able. CXR this am appears stable (no pneumothorax, mild cardiomegaly, bibasilar atelectasis, small left pleural effusion). Encourage incentive spirometer 3. Volume overload-Will continue with Lasix 40 mg orally 5. Expected post op blood loss anemia-H and H this am stable at 8.7 and 26.9. Will start oral ferrous 6. Thrombocytopenia-platelets this am increased to 132,000 7. Supplement potassium 8. Creatinine slightly decreased to 1.66 this am. Creatinine upon admission 1.37.  ACE stopped yesterday 9. Remove EPW today 10. Possible discharge 1-2 days depending on oxygen requirements  [place] Gil M ZimmermanPA-C 10/18/2019,7:01 AM

## 2019-10-18 NOTE — Progress Notes (Signed)
   He is doing well.  Avoid angiotensin receptor/ACE inhibitor therapy due to kidney function.  This can be instituted later as an OP.

## 2019-10-18 NOTE — Progress Notes (Signed)
CARDIAC REHAB PHASE I   PRE:  Rate/Rhythm: 81 SR    BP: sitting 137/73    SaO2: 96 1/2L  MODE:  Ambulation: 420 ft   POST:  Rate/Rhythm: 104 ST    BP: sitting 154/66     SaO2: 90 RA  Min assist to get to EOB. Ambulated independently on RA. SaO2 maintained 90 RA second half of walk however pt began wheezing. Stopped to rest but ultimately returned to room. To straight back chair at pts request. Left off O2, SaO2 92-96 RA. Encouraged IS and more ambulation. He can ambulate independently. [place], ACSM 10/18/2019 11:44 AM

## 2019-10-19 LAB — BASIC METABOLIC PANEL
Anion gap: 14 (ref 5–15)
BUN: 28 mg/dL — ABNORMAL HIGH (ref 8–23)
CO2: 23 mmol/L (ref 22–32)
Calcium: 8.5 mg/dL — ABNORMAL LOW (ref 8.9–10.3)
Chloride: 102 mmol/L (ref 98–111)
Creatinine, Ser: 1.56 mg/dL — ABNORMAL HIGH (ref 0.61–1.24)
GFR calc Af Amer: 48 mL/min — ABNORMAL LOW (ref >60)
GFR calc non Af Amer: 41 mL/min — ABNORMAL LOW (ref >60)
Glucose, Bld: 101 mg/dL — ABNORMAL HIGH (ref 70–99)
Potassium: 4 mmol/L (ref 3.5–5.1)
Sodium: 139 mmol/L (ref 135–145)

## 2019-10-19 MED ORDER — FUROSEMIDE 40 MG PO TABS: 40.0000 mg | ORAL_TABLET | Freq: Every day | ORAL | 0 refills | Status: DC

## 2019-10-19 MED ORDER — TRAMADOL HCL 50 MG PO TABS: 50.0000 mg | ORAL_TABLET | Freq: Four times a day (QID) | ORAL | 0 refills | Status: DC | PRN

## 2019-10-19 MED ORDER — VITAMIN E 1000 UNITS PO CAPS: 1000.0000 [IU] | ORAL_CAPSULE | Freq: Every day | ORAL | Status: AC

## 2019-10-19 MED ORDER — METOPROLOL TARTRATE 37.5 MG PO TABS: 37.5000 mg | ORAL_TABLET | Freq: Two times a day (BID) | ORAL | 1 refills | Status: DC

## 2019-10-19 MED ORDER — POTASSIUM CHLORIDE CRYS ER 20 MEQ PO TBCR: 20.0000 meq | EXTENDED_RELEASE_TABLET | Freq: Every day | ORAL | 0 refills | Status: DC

## 2019-10-19 MED ORDER — FERROUS SULFATE 325 (65 FE) MG PO TABS: 325.0000 mg | ORAL_TABLET | Freq: Every day | ORAL | 3 refills | Status: DC

## 2019-10-19 MED FILL — Magnesium Sulfate Inj 50%: INTRAMUSCULAR | Qty: 10 | Status: AC

## 2019-10-19 MED FILL — Potassium Chloride Inj 2 mEq/ML: INTRAVENOUS | Qty: 40 | Status: AC

## 2019-10-19 MED FILL — Heparin Sodium (Porcine) Inj 1000 Unit/ML: INTRAMUSCULAR | Qty: 30 | Status: AC

## 2019-10-19 NOTE — Progress Notes (Signed)
Discussed sternal precautions, IS, exercise, diet, and CRPII. Will refer to [place]. Good reception. [id number] [place], ACSM 10:46 AM 10/19/2019

## 2019-10-19 NOTE — Progress Notes (Signed)
Discharge instructions given to Corey [name], daughter and brother.  Discussed new medication, medication changes and side effects.  Discussed signs and symptoms to watch for and when to contact the physician.  Discussed follow up appointments and activities.  Verbalized understanding.

## 2019-10-19 NOTE — Care Management Important Message (Signed)
Important Message  Patient Details  Name: Corey [name] MRN: [id number] Date of Birth: 12-24-1935   Medicare Important Message Given:  Yes     Shelda Altes 10/19/2019, 11:03 AM

## 2019-10-19 NOTE — Progress Notes (Addendum)
      [address]       ,[place] [address]             [phone]        5 Days Post-Op Procedure(s) (LRB): CORONARY ARTERY BYPASS GRAFTING (CABG) x 2 (N/A) TRANSESOPHAGEAL ECHOCARDIOGRAM (TEE) (N/A) Endovein Harvest Of Greater Saphenous Vein (Right)  Subjective: He slept really well. Patient has no specific complaint this am.   Objective: Vital signs in last 24 hours: Temp:  [97.9 F (36.6 C)-99.4 F (37.4 C)] 99 F (37.2 C) (04/21 0339) Pulse Rate:  [80-115] 80 (04/21 0339) Cardiac Rhythm: Normal sinus rhythm;Sinus tachycardia (04/20 2103) Resp:  [13-23] 21 (04/21 0339) BP: (120-177)/(54-76) 128/54 (04/21 0339) SpO2:  [93 %-99 %] 97 % (04/21 0339) Weight:  [105.6 kg] 105.6 kg (04/21 0338)  Pre op weight 105.4 kg Current Weight  10/19/19 105.6 kg      Intake/Output from previous day: 04/20 0701 - 04/21 0700 In: 320 [P.O.:320] Out: 1100 [Urine:1100]   Physical Exam:  Cardiovascular: RRR Pulmonary: Clear to auscultation bilaterally Abdomen: Soft, non tender, bowel sounds present. Extremities: Trace bilateral lower extremity edema. Wounds: Clean and dry.  No erythema or signs of infection.  Lab Results: CBC: Recent Labs    10/17/19 0330 10/18/19 0410  WBC 10.9* 8.6  HGB 9.6* 8.7*  HCT 29.4* 26.9*  PLT 110* 132*   BMET:  Recent Labs    10/18/19 0410 10/19/19 0331  NA 138 139  K 3.6 4.0  CL 104 102  CO2 24 23  GLUCOSE 98 101*  BUN 33* 28*  CREATININE 1.66* 1.56*  CALCIUM 8.5* 8.5*    PT/INR:  Lab Results  Component Value Date   INR 1.3 (H) 10/14/2019   INR 1.1 10/09/2019   INR 1.0 10/09/2019   ABG:  INR: Will add last result for INR, ABG once components are confirmed Will add last 4 CBG results once components are confirmed  Assessment/Plan:  1. CV-SR with HR in the 80's this am. On Lopressor 37.5 mg bid and Amlodipine 5 mg daily as taken prior to surgery. 2. Pulmonary-On room air.  Encourage incentive  spirometer 3. Volume overload-Will continue with Lasix 40 mg orally for several more days then stop 4. Expected post op blood loss anemia-H and H this am stable at 8.7 and 26.9. Continue oral ferrous 5. Thrombocytopenia-last platelets increased to 132,000 6. Creatinine slightly decreased to 1.56 this am. Creatinine upon admission 1.37.  Not on ACE 7. Chest tube sutures to remain;will remove in office after discharge 8.  Probable discharge later today  Sharalyn Ink [facility] 10/19/2019,7:03 AM   Chart reviewed, patient examined, agree with above. Feels well, slept better than he has in a long time.  Ready to go home.

## 2019-10-19 NOTE — Plan of Care (Signed)
Care plan complete

## 2019-10-19 NOTE — TOC Transition Note (Signed)
Transition of Care [facility]) - CM/SW Discharge Note Marvetta Gibbons RN, BSN Transitions of Care Unit 4E- RN Case Manager [phone]   Patient Details  Name: Corey [name] MRN: [id number] Date of Birth: 1936-04-15  Transition of Care [facility] CM/SW Contact:  Dawayne Patricia, RN Phone Number: 10/19/2019, 3:22 PM   Clinical Narrative:    Pt stable for transition home, orders placed for HHPT/OT, no DME needs. Spoke with pt and family at the bedside- list provided Per CMS guidelines from medicare.gov website with star ratings (copy placed in shadow chart) for choice- per pt/family first choice would be [facility], then [facility] , and after that they have no preference and give permission for CM to secure agency on behalf of patient.  Address and phone # confirmed with pt.  Call made to [facility] with [facility]- unable to accept due to staffing, Call made to [facility] with [facility] Lemmings- unable to accept,  Reached out to Cassie with [facility]- who was able to accept and get auth for [facility] services.  Call made to patient at home to inform him of [facility] agency that had been secured and name of agency that would be contacting him to arrange [facility] visits.    Final next level of care: [place] Barriers to Discharge: No Barriers Identified   Patient Goals and CMS Choice Patient states their goals for this hospitalization and ongoing recovery are:: return home and get stronger CMS Medicare.gov Compare Post Acute Care list provided to:: Patient Choice offered to / list presented to : Patient, Adult Children  Discharge Placement               Home with [facility]        Discharge Plan and Services   Discharge Planning Services: CM Consult Post Acute Care Choice: Home Health          DME Arranged: N/A DME Agency: NA       HH Arranged: PT, OT HH Agency: [facility] Date [place]: 10/19/19 Time [place]: 1220 Representative spoke with at [place]: Cassie  Social  Determinants of Health ([place]) Interventions     Readmission Risk Interventions No flowsheet data found.

## 2019-10-20 ENCOUNTER — Telehealth: Payer: Self-pay | Admitting: Physician Assistant

## 2019-10-20 DIAGNOSIS — Z951 Presence of aortocoronary bypass graft: Secondary | ICD-10-CM

## 2019-10-20 DIAGNOSIS — Z48812 Encounter for surgical aftercare following surgery on the circulatory system: Secondary | ICD-10-CM | POA: Diagnosis not present

## 2019-10-20 MED FILL — Electrolyte-R (PH 7.4) Solution: INTRAVENOUS | Qty: 4000 | Status: AC

## 2019-10-20 MED FILL — Sodium Chloride IV Soln 0.9%: INTRAVENOUS | Qty: 3000 | Status: AC

## 2019-10-20 MED FILL — Heparin Sodium (Porcine) Inj 1000 Unit/ML: INTRAMUSCULAR | Qty: 10 | Status: AC

## 2019-10-20 MED FILL — Lidocaine HCl Local Soln Prefilled Syringe 100 MG/5ML (2%): INTRAMUSCULAR | Qty: 5 | Status: AC

## 2019-10-20 MED FILL — Mannitol IV Soln 20%: INTRAVENOUS | Qty: 500 | Status: AC

## 2019-10-20 MED FILL — Sodium Bicarbonate IV Soln 8.4%: INTRAVENOUS | Qty: 50 | Status: AC

## 2019-10-20 NOTE — Telephone Encounter (Signed)
Patient's daughter Levada Dy left message with answering service: Re: was just released from hospital yesterday/ and is missing ferrous sulfate 325.

## 2019-10-20 NOTE — Telephone Encounter (Signed)
No voicemail is set up for daughter Levada Dy. Dr Vivi Martens office filled Iron, they need to call that office.

## 2019-10-24 ENCOUNTER — Other Ambulatory Visit: Payer: Self-pay

## 2019-10-24 MED ORDER — FERROUS SULFATE 325 (65 FE) MG PO TABS: 325.0000 mg | ORAL_TABLET | Freq: Every day | ORAL | 3 refills | Status: DC

## 2019-10-24 NOTE — Telephone Encounter (Signed)
Per [facility] Nurse Kathlee Nations, Pt was discharged with instructions to take Iron, however the Iron prescription has not been called in to [facility].   Please call Kathlee Nations when prescription is called in  [phone]   Thanks renee

## 2019-10-24 NOTE — Telephone Encounter (Signed)
**Note De-Identified Corey [name] Obfuscation** Not sure why this was sent to [facility] for a refill but I am forwarding to the [place] staff to fill.

## 2019-10-26 NOTE — Progress Notes (Signed)
Cardiology Office Note    Date:  11/01/2019   ID:  Corey [name], DOB [birth year], MRN [id number]  PCP:  [facility]  Cardiologist: Rozann Lesches, MD EPS: None  Chief Complaint  Patient presents with  . Hospitalization Follow-up    History of Present Illness:  Corey [name] is a 84 y.o. 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  Patient presented to [facility] ED on 10/09/2019 for evaluation of chest pain and troponin returned abnormal. Transferred to [facility] for further evaluation.  Cardiac cath  demonstrated ostial LAD disease leading to coronary artery bypass grafting x2 with LIMA to LAD and diagonal saphenous vein graft on 10/14/2019.  He had AKI on CKD stage III with a creatinine peak of 1.74.  Crt 1.56 ay discharge 4/21/21ARB was held throughout hospitalization.  Patient comes in for f/u. Having some pain in his shoulder blades and left arm-throbbing type pain. Worse when he's coughing or straining to have a BM or moving it around.BP has been running normal at home.  Walking about 50 feet without difficulty.    Past Medical History:  Diagnosis Date  . Anaphylaxis    Aspirin  . Coronary atherosclerosis of native coronary artery    a. BMS to RCA and LCx 10/09, DES to D1 and RCA 04/2014  . Essential hypertension   . Hyperlipidemia   . Myocardial infarction ([place]) 1998  . NSTEMI (non-ST elevated myocardial infarction) ([place]) 03/2008  . Seasonal allergies     Past Surgical History:  Procedure Laterality Date  . CARDIAC CATHETERIZATION     Cath 05/18/2014 DES to D1 and mid RCA  . COLONOSCOPY  05/01/2011   Procedure: COLONOSCOPY;  Surgeon: Daneil Dolin, MD;  [facility];  Service: Endoscopy;  Laterality: N/A;  11:30  .  CORONARY ANGIOPLASTY WITH STENT PLACEMENT  07/1996; 03/2008; 05/18/2014   "?3; ?2; 2"  . CORONARY ARTERY BYPASS GRAFT N/A 10/14/2019   Procedure: CORONARY ARTERY BYPASS GRAFTING (CABG) x 2;  Surgeon: Gaye Pollack, MD;  Location: [place];  Service: Open Heart Surgery;  Laterality: N/A;  . ENDOVEIN HARVEST OF GREATER SAPHENOUS VEIN Right 10/14/2019   Procedure: [place] Of Greater Saphenous Vein;  Surgeon: Gaye Pollack, MD;  Location: [facility] OR;  Service: Open Heart Surgery;  Laterality: Right;  . INTRAVASCULAR PRESSURE WIRE/FFR STUDY N/A 10/10/2019   Procedure: INTRAVASCULAR PRESSURE WIRE/FFR STUDY;  Surgeon: Nelva Bush, MD;  Location: [place] CV LAB;  Service: Cardiovascular;  Laterality: N/A;  . LEFT HEART CATH AND CORONARY ANGIOGRAPHY N/A 10/10/2019   Procedure: LEFT HEART CATH AND CORONARY ANGIOGRAPHY;  Surgeon: Nelva Bush, MD;  Location: [place] CV LAB;  Service: Cardiovascular;  Laterality: N/A;  . LEFT HEART CATHETERIZATION WITH CORONARY ANGIOGRAM N/A 05/18/2014   Procedure: LEFT HEART CATHETERIZATION WITH CORONARY ANGIOGRAM;  Surgeon: Blane Ohara, MD;  Location: [facility] CATH LAB;  Service: Cardiovascular;  Laterality: N/A;  . TEE WITHOUT CARDIOVERSION N/A 10/14/2019   Procedure: TRANSESOPHAGEAL ECHOCARDIOGRAM (TEE);  Surgeon: Gaye Pollack, MD;  Location: [place];  Service: Open Heart Surgery;  Laterality: N/A;    Current Medications: Current Meds  Medication Sig  . amLODipine (NORVASC) 5 MG tablet Take 1 tablet (5 mg total)  by mouth daily.  . Ascorbic Acid (VITAMIN C) 1000 MG tablet Take 1,000 mg by mouth daily.    [name] atorvastatin (LIPITOR) 80 MG tablet Take 1 tablet daily at 6 pm.  . clopidogrel (PLAVIX) 75 MG tablet TAKE 1 TABLET BY MOUTH ONCE DAILY - NEED FOLLOW UP FOR REFILLS  . ferrous sulfate 325 (65 FE) MG tablet Take 1 tablet (325 mg total) by mouth daily with breakfast. For one month then stop. If develops constipation, may stop sooner  . Metoprolol  Tartrate 37.5 MG TABS Take 37.5 mg by mouth 2 (two) times daily.  . Omega-3 Fatty Acids (FISH OIL) 1000 MG CAPS Take 1,000 mg by mouth 2 (two) times daily.  . traMADol (ULTRAM) 50 MG tablet Take 1 tablet (50 mg total) by mouth every 6 (six) hours as needed for moderate pain.  . vitamin E 1000 UNIT capsule Take 1 capsule (1,000 Units total) by mouth daily.     Allergies:   Aspirin, Other, and Penicillins   Social History   Socioeconomic History  . Marital status: Widowed    Spouse name: Not on file  . Number of children: Not on file  . Years of education: Not on file  . Highest education level: Not on file  Occupational History  . Occupation: Retired    Comment: Quarry manager  Tobacco Use  . Smoking status: Former Smoker    Packs/day: 2.00    Years: 45.00    Pack years: 90.00    Types: Cigarettes    Quit date: 07/31/1996    Years since quitting: 23.2  . Smokeless tobacco: Never Used  Substance and Sexual Activity  . Alcohol use: No  . Drug use: No  . Sexual activity: Not Currently  Other Topics Concern  . Not on file  Social History Narrative  . Not on file   Social Determinants of Health   Financial Resource Strain:   . Difficulty of Paying Living Expenses:   Food Insecurity:   . Worried About Charity fundraiser in the Last Year:   . Arboriculturist in the Last Year:   Transportation Needs:   . Film/video editor (Medical):   [name] Lack of Transportation (Non-Medical):   Physical Activity:   . Days of Exercise per Week:   . Minutes of Exercise per Session:   Stress:   . Feeling of Stress :   Social Connections:   . Frequency of Communication with Friends and Family:   . Frequency of Social Gatherings with Friends and Family:   . Attends Religious Services:   . Active Member of Clubs or Organizations:   . Attends Archivist Meetings:   [name] Marital Status:      Family History:  The patient's family history includes Hypertension in an other family  member.   ROS:   Please see the history of present illness.    ROS All other systems reviewed and are negative.   PHYSICAL EXAM:   VS:  BP 136/68   Pulse 80   Temp (!) 96.9 F (36.1 C)   Ht 5\' 8"  (1.727 m)   Wt 226 lb (102.5 kg)   SpO2 97%   BMI 34.36 kg/m   Physical Exam  GEN: Obese, in no acute distress  Neck: no JVD, carotid bruits, or masses Cardiac:RRR; no murmurs, rubs, or gallops  Respiratory:  clear to auscultation bilaterally, normal work of breathing GI: soft, nontender, nondistended, + BS Ext: without cyanosis, clubbing,  or edema, Good distal pulses bilaterally Neuro:  Alert and Oriented x 3 Psych: euthymic mood, full affect  Wt Readings from Last 3 Encounters:  11/01/19 226 lb (102.5 kg)  10/19/19 232 lb 14.4 oz (105.6 kg)  04/18/19 265 lb (120.2 kg)      Studies/Labs Reviewed:   EKG:  EKG is not ordered today.  Recent Labs: 10/10/2019: ALT 19 10/15/2019: Magnesium 2.4 10/18/2019: Hemoglobin 8.7; Platelets 132 10/19/2019: BUN 28; Creatinine, Ser 1.56; Potassium 4.0; Sodium 139   Lipid Panel    Component Value Date/Time   CHOL 148 10/10/2019 0511   TRIG 110 10/10/2019 0511   HDL 30 (L) 10/10/2019 0511   CHOLHDL 4.9 10/10/2019 0511   VLDL 22 10/10/2019 0511   LDLCALC 96 10/10/2019 0511    Additional studies/ records that were reviewed today include:  Intra-Op TEE 10/14/2019 PRE-OP FINDINGS   Left Ventricle: The left ventricle has low normal systolic function, with  an ejection fraction of 50-55%. The cavity size was normal. There is  severely increased left ventricular wall thickness. No evidence of left  ventricular regional wall motion  abnormalities.   Right Ventricle: The right ventricle has normal systolic function. The  cavity was normal. There is no increase in right ventricular wall  thickness.   Left Atrium: Left atrial size was normal in size. The left atrial  appendage is well visualized and there is no evidence of thrombus present.       Interatrial Septum: No atrial level shunt detected by color flow Doppler.   Pericardium: There is no evidence of pericardial effusion.   Mitral Valve: The mitral valve is normal in structure. No thickening of  the mitral valve leaflet. No calcification of the mitral valve leaflet.  Mitral valve regurgitation is trivial by color flow Doppler.   Tricuspid Valve: The tricuspid valve was normal in structure. Tricuspid  valve regurgitation is trivial by color flow Doppler.   Aortic Valve: The aortic valve is tricuspid Aortic valve regurgitation was  not visualized by color flow Doppler. There is no evidence of aortic valve  stenosis.   Pulmonic Valve: The pulmonic valve was normal in structure, with normal.  Pulmonic valve regurgitation is trivial by color flow Doppler.    Aorta: There is evidence of atheroma plaque in the descending aorta; Grade  I, measuring 1-46mm in size.     Albertha Ghee MD  Electronically signed by Albertha Ghee MD  Signature Date/Time: 10/17/2019/7:05:30 AM        Final    CATH 10/10/2019   Diagnostic Dominance: Right  Echo 10/10/2019 1. Left ventricular ejection fraction, by estimation, is 45 to 50%. The  left ventricle has mildly decreased function. The left ventricle  demonstrates global hypokinesis. There is mild concentric left ventricular  hypertrophy. Left ventricular diastolic  parameters are consistent with Grade I diastolic dysfunction (impaired  relaxation).   2. Right ventricular systolic function is normal. The right ventricular  size is normal. There is normal pulmonary artery systolic pressure.   3. The mitral valve is normal in structure. Trivial mitral valve  regurgitation. No evidence of mitral stenosis.   4. The aortic valve is normal in structure. Aortic valve regurgitation is  not visualized. No aortic stenosis is present.   5. Aortic dilatation noted. There is mild dilatation at the level of the  sinuses of Valsalva  measuring 37 mm.   6. The inferior vena cava is normal in size with <50% respiratory  variability, suggesting right atrial pressure  of 8 mmHg.        ASSESSMENT:    1. Coronary artery disease involving coronary bypass graft of native heart without angina pectoris   2. Ischemic cardiomyopathy   3. Chronic combined systolic and diastolic CHF (congestive heart failure) ([place])   4. CKD (chronic kidney disease) stage 2, GFR 60-89 ml/min   5. Essential hypertension   6. Hyperlipidemia, unspecified hyperlipidemia type      PLAN:  In order of problems listed above:  CAD with history of BMS to the RCA and PCI to the circumflex in 2009, DES to the mid RCA and DES to diagonal 06/2013 now status post CABG x2 with LIMA to the mid LAD and SVG to the diagonal 10/14/2019 no aspirin because of anaphylaxis.  On Plavix, beta-blocker  Ischemic cardiomyopathy EF 45 to 50% preop should recover.  Intra-Op TEE EF 50 to 55% beta-blocker was to be switched to carvedilol but still on metoprolol-just had meds filled so won't change today. Consider changing back at f/u appt.   Chronic combined systolic and diastolic CHF-compensated-check bmet today and if ok consider restarting lisinopril(was on 20 mg daily)  CKD stage II with AKI while in the hospital.  ACE inhibitor held  Essential hypertension BP controlled.   Hyperlipidemia Target LDL less than 70-LDL 96 10/10/2019 on atorvastatin 80 mg once daily and fish oil-will need FLP in 2-3 months.  Medication Adjustments/Labs and Tests Ordered: Current medicines are reviewed at length with the patient today.  Concerns regarding medicines are outlined above.  Medication changes, Labs and Tests ordered today are listed in the Patient Instructions below. Patient Instructions  Medication Instructions:  Your physician recommends that you continue on your current medications as directed. Please refer to the Current Medication list given to you today.  *If you need a  refill on your cardiac medications before your next appointment, please call your pharmacy*   Lab Work: Your physician recommends that you return for lab work in: Today   If you have labs (blood work) drawn today and your tests are completely normal, you will receive your results only by: [name] MyChart Message (if you have MyChart) OR . A paper copy in the mail If you have any lab test that is abnormal or we need to change your treatment, we will call you to review the results.   Testing/Procedures: NONE    Follow-Up: At [facility], you and your health needs are our priority.  As part of our continuing mission to provide you with exceptional heart care, we have created designated Provider Care Teams.  These Care Teams include your primary Cardiologist (physician) and Advanced Practice Providers (APPs -  Physician Assistants and Nurse Practitioners) who all work together to provide you with the care you need, when you need it.  We recommend signing up for the patient portal called "MyChart".  Sign up information is provided on this After Visit Summary.  MyChart is used to connect with patients for Virtual Visits (Telemedicine).  Patients are able to view lab/test results, encounter notes, upcoming appointments, etc.  Non-urgent messages can be sent to your provider as well.   To learn more about what you can do with MyChart, go to [email or web address].    Your next appointment:   6-8 week(s)  The format for your next appointment:   In Person  Provider:   Rozann Lesches, MD   Other Instructions Thank you for choosing [facility]!       Signed, Ermalinda Barrios,  PA-C  11/01/2019 2:05 PM    [facility] [place], [place],   [address] Phone: [phone]; Fax: [phone]

## 2019-10-27 NOTE — Telephone Encounter (Signed)
I refilled the FeSo4 rx for patient

## 2019-10-31 ENCOUNTER — Other Ambulatory Visit: Payer: Self-pay

## 2019-10-31 ENCOUNTER — Ambulatory Visit (INDEPENDENT_AMBULATORY_CARE_PROVIDER_SITE_OTHER): Payer: Self-pay

## 2019-10-31 DIAGNOSIS — Z4802 Encounter for removal of sutures: Secondary | ICD-10-CM

## 2019-10-31 DIAGNOSIS — Z951 Presence of aortocoronary bypass graft: Secondary | ICD-10-CM

## 2019-10-31 NOTE — Progress Notes (Signed)
Removed 3 sutures from chest tube incision sites with no signs of infection and patient tolerated well. He is scheduled to see Dr Cyndia Bent on 11/23/19 with CXR

## 2019-11-01 ENCOUNTER — Encounter: Payer: Self-pay | Admitting: Physician Assistant

## 2019-11-01 ENCOUNTER — Ambulatory Visit: Payer: Medicare HMO | Admitting: Physician Assistant

## 2019-11-01 ENCOUNTER — Other Ambulatory Visit (HOSPITAL_COMMUNITY)
Admission: RE | Admit: 2019-11-01 | Discharge: 2019-11-01 | Disposition: A | Discharge status: Home / Self Care | Payer: Medicare HMO | Source: Ambulatory Visit | Attending: Physician Assistant | Admitting: Physician Assistant

## 2019-11-01 ENCOUNTER — Other Ambulatory Visit: Payer: Self-pay

## 2019-11-01 VITALS — BP 136/68 | HR 80 | Temp 96.9°F | Ht 68.0 in | Wt 226.0 lb

## 2019-11-01 DIAGNOSIS — I5042 Chronic combined systolic (congestive) and diastolic (congestive) heart failure: Secondary | ICD-10-CM

## 2019-11-01 DIAGNOSIS — I25810 Atherosclerosis of coronary artery bypass graft(s) without angina pectoris: Secondary | ICD-10-CM | POA: Diagnosis not present

## 2019-11-01 DIAGNOSIS — E785 Hyperlipidemia, unspecified: Secondary | ICD-10-CM

## 2019-11-01 DIAGNOSIS — I255 Ischemic cardiomyopathy: Secondary | ICD-10-CM

## 2019-11-01 DIAGNOSIS — N182 Chronic kidney disease, stage 2 (mild): Secondary | ICD-10-CM | POA: Diagnosis not present

## 2019-11-01 DIAGNOSIS — I10 Essential (primary) hypertension: Secondary | ICD-10-CM

## 2019-11-01 LAB — BASIC METABOLIC PANEL
Anion gap: 9 (ref 5–15)
BUN: 15 mg/dL (ref 8–23)
CO2: 21 mmol/L — ABNORMAL LOW (ref 22–32)
Calcium: 9.4 mg/dL (ref 8.9–10.3)
Chloride: 107 mmol/L (ref 98–111)
Creatinine, Ser: 1.17 mg/dL (ref 0.61–1.24)
GFR calc Af Amer: >60 mL/min (ref >60)
GFR calc non Af Amer: 58 mL/min — ABNORMAL LOW (ref >60)
Glucose, Bld: 105 mg/dL — ABNORMAL HIGH (ref 70–99)
Potassium: 3.9 mmol/L (ref 3.5–5.1)
Sodium: 137 mmol/L (ref 135–145)

## 2019-11-01 NOTE — Patient Instructions (Signed)
Medication Instructions:  Your physician recommends that you continue on your current medications as directed. Please refer to the Current Medication list given to you today.  *If you need a refill on your cardiac medications before your next appointment, please call your pharmacy*   Lab Work: Your physician recommends that you return for lab work in: Today   If you have labs (blood work) drawn today and your tests are completely normal, you will receive your results only by: [name] MyChart Message (if you have MyChart) OR . A paper copy in the mail If you have any lab test that is abnormal or we need to change your treatment, we will call you to review the results.   Testing/Procedures: NONE    Follow-Up: At [facility], you and your health needs are our priority.  As part of our continuing mission to provide you with exceptional heart care, we have created designated Provider Care Teams.  These Care Teams include your primary Cardiologist (physician) and Advanced Practice Providers (APPs -  Physician Assistants and Nurse Practitioners) who all work together to provide you with the care you need, when you need it.  We recommend signing up for the patient portal called "MyChart".  Sign up information is provided on this After Visit Summary.  MyChart is used to connect with patients for Virtual Visits (Telemedicine).  Patients are able to view lab/test results, encounter notes, upcoming appointments, etc.  Non-urgent messages can be sent to your provider as well.   To learn more about what you can do with MyChart, go to [email or web address].    Your next appointment:   6-8 week(s)  The format for your next appointment:   In Person  Provider:   Rozann Lesches, MD   Other Instructions Thank you for choosing [facility]!

## 2019-11-03 ENCOUNTER — Telehealth: Payer: Self-pay | Admitting: *Deleted

## 2019-11-03 DIAGNOSIS — I5042 Chronic combined systolic (congestive) and diastolic (congestive) heart failure: Secondary | ICD-10-CM

## 2019-11-03 DIAGNOSIS — Z79899 Other long term (current) drug therapy: Secondary | ICD-10-CM

## 2019-11-03 MED ORDER — LISINOPRIL 10 MG PO TABS: 10.0000 mg | ORAL_TABLET | Freq: Every day | ORAL | 3 refills | Status: DC

## 2019-11-03 NOTE — Telephone Encounter (Signed)
-----   Message from Imogene Burn, [place] sent at 11/01/2019  3:05 PM EDT ----- Kidney function normal. Restart lisinopril 10 mg once daily and repeat bmet in 2 weeks. He used to be on 20 mg so if he has those at home he can take 1/2 daily. thanks

## 2019-11-10 ENCOUNTER — Telehealth: Payer: Self-pay

## 2019-11-10 NOTE — Telephone Encounter (Signed)
I spoke with patient.He went back to bed after the episode of dizziness and when he got back up he was fine.He said the [facility] visited, took his BP and HR and sais his VS were ok and left. I told him to monitor and if this happens again, to sit down and get BP and HR and call us back, he agrees with dispo.

## 2019-11-10 NOTE — Telephone Encounter (Signed)
Pt woke this morning, dizzy, unstable on feet, set on side of bed for awhile before being able to stand. No nausea.  Please call [phone]   Thanks renee

## 2019-11-14 ENCOUNTER — Ambulatory Visit (INDEPENDENT_AMBULATORY_CARE_PROVIDER_SITE_OTHER): Payer: Self-pay

## 2019-11-14 ENCOUNTER — Telehealth: Payer: Self-pay

## 2019-11-14 ENCOUNTER — Other Ambulatory Visit: Payer: Self-pay

## 2019-11-14 DIAGNOSIS — Z5189 Encounter for other specified aftercare: Secondary | ICD-10-CM

## 2019-11-14 DIAGNOSIS — Z4802 Encounter for removal of sutures: Secondary | ICD-10-CM

## 2019-11-14 MED ORDER — POTASSIUM CHLORIDE CRYS ER 20 MEQ PO TBCR: 20.0000 meq | EXTENDED_RELEASE_TABLET | Freq: Every day | ORAL | 0 refills | Status: DC

## 2019-11-14 MED ORDER — FUROSEMIDE 40 MG PO TABS: 40.0000 mg | ORAL_TABLET | Freq: Every day | ORAL | 0 refills | Status: DC

## 2019-11-14 NOTE — Telephone Encounter (Signed)
Pt called to report leg wound from bypass has reopened. Pt reports no other open wounds at this time. Pt instructed to call [facility].

## 2019-11-14 NOTE — Progress Notes (Signed)
Patient is s/p CABG 10/14/19 with Dr. Cyndia Bent.  He has a c/o wound opening from his right right leg EVH site and clear drainage.  He stated that this just happened today.  Upon observation patient has a small opening to his right lower leg which is aprox. 0.5cm in length.  Does not appear to be infected.  Wound is draining clear drainage.  Dr. Orvan Seen was able to look at the incision and stated that patient needed to elevate his legs as often as possible and placed steri-strips on opening after cleaning the site.  Patient's leg was then wrapped with an ace bandage.  Bilateral legs appeared to have very minimal swelling.  Patient advised to keep his follow-up appointment with Dr. Cyndia Bent 11/23/19 and to contact the office if there is any questions or concerns.  He acknowledged receipt.

## 2019-11-14 NOTE — Telephone Encounter (Signed)
Pt states he noticed leg wound is open.  Please call [phone]   Thanks renee

## 2019-11-14 NOTE — Telephone Encounter (Signed)
-----   Message from Laury Deep, RN sent at 11/14/2019  2:23 PM EDT ----- Per BKB:   Please order 40mg  lasix and 20 meq of K x 7 days.    I told him last creatinine and K were normal.  He wants to see him back in a week or so but appt is already made so we are set there.  Thanks, Ryan  Please have patient call back if anything changes.

## 2019-11-14 NOTE — Telephone Encounter (Signed)
Contacted patient of prescriptions sent into his preferred pharmacy, electronically.  Patient acknowledged receipt.

## 2019-11-18 ENCOUNTER — Other Ambulatory Visit (HOSPITAL_COMMUNITY)
Admission: RE | Admit: 2019-11-18 | Discharge: 2019-11-18 | Disposition: A | Discharge status: Home / Self Care | Payer: Medicare HMO | Source: Ambulatory Visit | Attending: Physician Assistant | Admitting: Physician Assistant

## 2019-11-18 ENCOUNTER — Other Ambulatory Visit: Payer: Self-pay

## 2019-11-18 DIAGNOSIS — Z79899 Other long term (current) drug therapy: Secondary | ICD-10-CM | POA: Insufficient documentation

## 2019-11-18 DIAGNOSIS — I5042 Chronic combined systolic (congestive) and diastolic (congestive) heart failure: Secondary | ICD-10-CM | POA: Diagnosis present

## 2019-11-18 LAB — BASIC METABOLIC PANEL
Anion gap: 11 (ref 5–15)
BUN: 18 mg/dL (ref 8–23)
CO2: 24 mmol/L (ref 22–32)
Calcium: 9.2 mg/dL (ref 8.9–10.3)
Chloride: 103 mmol/L (ref 98–111)
Creatinine, Ser: 1.54 mg/dL — ABNORMAL HIGH (ref 0.61–1.24)
GFR calc Af Amer: 48 mL/min — ABNORMAL LOW (ref >60)
GFR calc non Af Amer: 42 mL/min — ABNORMAL LOW (ref >60)
Glucose, Bld: 134 mg/dL — ABNORMAL HIGH (ref 70–99)
Potassium: 4.2 mmol/L (ref 3.5–5.1)
Sodium: 138 mmol/L (ref 135–145)

## 2019-11-22 ENCOUNTER — Other Ambulatory Visit: Payer: Self-pay | Admitting: Surgery

## 2019-11-22 ENCOUNTER — Telehealth: Payer: Self-pay

## 2019-11-22 ENCOUNTER — Telehealth: Payer: Self-pay | Admitting: *Deleted

## 2019-11-22 DIAGNOSIS — Z79899 Other long term (current) drug therapy: Secondary | ICD-10-CM

## 2019-11-22 DIAGNOSIS — Z951 Presence of aortocoronary bypass graft: Secondary | ICD-10-CM

## 2019-11-22 NOTE — Telephone Encounter (Signed)
Pt called and results given.

## 2019-11-22 NOTE — Telephone Encounter (Signed)
-----   Message from Laurine Blazer, LPN sent at [id number]  4:39 PM EDT -----  ----- Message ----- From: Murrell Converse Sent: 11/21/2019   7:55 AM EDT To: Laurine Blazer, LPN  Kidney function bumped up again. Need to stop lisinopril and recheck renal in 2 weeks. thanks

## 2019-11-22 NOTE — Telephone Encounter (Signed)
Pt is returning Lenz,PA-C call. Pt questioned if the call was about his labs.   Please call [phone]  Thanks renee

## 2019-11-22 NOTE — Telephone Encounter (Signed)
Pt notified and voiced understanding 

## 2019-11-23 ENCOUNTER — Ambulatory Visit
Admission: RE | Admit: 2019-11-23 | Discharge: 2019-11-23 | Disposition: A | Discharge status: Home / Self Care | Payer: Medicare HMO | Source: Ambulatory Visit | Attending: Surgery | Admitting: Surgery

## 2019-11-23 ENCOUNTER — Other Ambulatory Visit: Payer: Self-pay

## 2019-11-23 ENCOUNTER — Ambulatory Visit (INDEPENDENT_AMBULATORY_CARE_PROVIDER_SITE_OTHER): Payer: Self-pay | Admitting: Surgery

## 2019-11-23 ENCOUNTER — Encounter: Payer: Self-pay | Admitting: Surgery

## 2019-11-23 VITALS — BP 140/72 | HR 90 | Resp 20 | Ht 68.0 in | Wt 223.0 lb

## 2019-11-23 DIAGNOSIS — Z951 Presence of aortocoronary bypass graft: Secondary | ICD-10-CM

## 2019-11-23 DIAGNOSIS — I2510 Atherosclerotic heart disease of native coronary artery without angina pectoris: Secondary | ICD-10-CM

## 2019-11-23 NOTE — Progress Notes (Signed)
HPI: Patient returns for routine postoperative follow-up having undergone coronary artery bypass graft surgery x2 on 10/14/2019. The patient's early postoperative recovery while in the hospital was notable for an uncomplicated postoperative course. Since hospital discharge the patient reports that he developed some swelling in his legs and was started on a 1 week course of Lasix by my office on 11/14/2019.  He was instructed to keep his legs elevated when he was not ambulating.  Both of these improved the swelling.  A BMET on 11/18/2019 showed a rise in his creatinine to 1.54 compared to his previous of 1.17 on 11/01/2019.  His creatinine was normal preoperatively and rose to 1.7 postoperatively but then returned to normal.  His lisinopril was stopped yesterday by cardiology and a follow-up renal panel was ordered for 2 weeks.  The rise in his creatinine is likely related to a combination of lisinopril and a diuretic.  He says that he has been walking daily without chest pain or shortness of breath and his energy level is improving.   Current Outpatient Medications  Medication Sig Dispense Refill  . amLODipine (NORVASC) 5 MG tablet Take 1 tablet (5 mg total) by mouth daily. 90 tablet 3  . Ascorbic Acid (VITAMIN C) 1000 MG tablet Take 1,000 mg by mouth daily.      [name] atorvastatin (LIPITOR) 80 MG tablet Take 1 tablet daily at 6 pm. 90 tablet 3  . clopidogrel (PLAVIX) 75 MG tablet TAKE 1 TABLET BY MOUTH ONCE DAILY - NEED FOLLOW UP FOR REFILLS 90 tablet 3  . furosemide (LASIX) 40 MG tablet Take 1 tablet (40 mg total) by mouth daily. 7 tablet 0  . Metoprolol Tartrate 37.5 MG TABS Take 37.5 mg by mouth 2 (two) times daily. 60 tablet 1  . Omega-3 Fatty Acids (FISH OIL) 1000 MG CAPS Take 1,000 mg by mouth 2 (two) times daily.    . potassium chloride (KLOR-CON) 20 MEQ tablet Take 1 tablet (20 mEq total) by mouth daily. 7 tablet 0  . traMADol (ULTRAM) 50 MG tablet Take 1 tablet (50 mg total) by mouth every 6  (six) hours as needed for moderate pain. 28 tablet 0  . vitamin E 1000 UNIT capsule Take 1 capsule (1,000 Units total) by mouth daily.     No current facility-administered medications for this visit.    Physical Exam: BP 140/72   Pulse 90   Resp 20   Ht 5\' 8"  (1.727 m)   Wt 223 lb (101.2 kg)   SpO2 97% Comment: RA  BMI 33.91 kg/m  He looks well. Cardiac exam shows regular rate and rhythm with normal heart sounds. Lungs are clear. The chest incision is well-healed and the sternum is stable. The right leg vein harvest incision is healed.  There is mild bilateral lower extremity edema slightly more on the right than the left.  Diagnostic Tests:  CLINICAL DATA:  Status post CABG  EXAM: CHEST - 2 VIEW  COMPARISON:  10/18/2019  FINDINGS: Frontal and lateral views of the chest demonstrates stable postsurgical changes from median sternotomy. The cardiac silhouette is unremarkable. Minimal residual atelectasis or scarring at the left lung base. No airspace disease, effusion, or pneumothorax. No acute bony abnormalities.  IMPRESSION: 1. No acute intrathoracic process.   Electronically Signed   By: Randa Ngo M.D.   On: 11/23/2019 16:16  Impression:  Overall I think he is making good progress following coronary bypass graft surgery.  I encouraged him to continue walking as  much possible.  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  He may need further diuretics if his legs continue to swell but I would continue to watch this for now until his kidney function returns to normal.  Plan:  He will continue to follow-up with cardiology and his PCP and will return to see me if he has any problems with his incisions.   Gaye Pollack, MD [facility] [phone]

## 2019-12-14 ENCOUNTER — Other Ambulatory Visit: Payer: Self-pay | Admitting: Physician Assistant

## 2019-12-14 ENCOUNTER — Other Ambulatory Visit: Payer: Self-pay

## 2019-12-14 MED ORDER — METOPROLOL TARTRATE 37.5 MG PO TABS: 37.5000 mg | ORAL_TABLET | Freq: Two times a day (BID) | ORAL | 3 refills | Status: DC

## 2019-12-14 NOTE — Telephone Encounter (Signed)
Spoke with pt who request 90 day refill of metoprolol Tartrate. Order placed.

## 2019-12-14 NOTE — Telephone Encounter (Signed)
Pt wants to make sure medication is called in to [facility] for 90day refills   Thanks renee

## 2020-01-03 ENCOUNTER — Ambulatory Visit (INDEPENDENT_AMBULATORY_CARE_PROVIDER_SITE_OTHER): Payer: Medicare HMO | Admitting: Cardiology

## 2020-01-03 ENCOUNTER — Encounter: Payer: Self-pay | Admitting: Cardiology

## 2020-01-03 ENCOUNTER — Other Ambulatory Visit: Payer: Self-pay

## 2020-01-03 VITALS — BP 148/74 | HR 72 | Ht 68.0 in | Wt 227.4 lb

## 2020-01-03 DIAGNOSIS — I255 Ischemic cardiomyopathy: Secondary | ICD-10-CM

## 2020-01-03 DIAGNOSIS — E782 Mixed hyperlipidemia: Secondary | ICD-10-CM | POA: Diagnosis not present

## 2020-01-03 DIAGNOSIS — I25119 Atherosclerotic heart disease of native coronary artery with unspecified angina pectoris: Secondary | ICD-10-CM | POA: Diagnosis not present

## 2020-01-03 NOTE — Progress Notes (Signed)
Cardiology Office Note  Date: 01/03/2020   ID: Corey [name], DOB Dec 05, 1935, MRN [id number]  PCP:  [facility]  Cardiologist:  Rozann Lesches, MD Electrophysiologist:  None   Chief Complaint  Patient presents with  . Cardiac follow-up    History of Present Illness: Corey [name] is an 84 y.o. male last seen in May by Ms. Bonnell Public PA-C.  He presents for a follow-up visit.  States that he is doing very well, no active angina symptoms at this time.  He goes to the [facility] most days of the week, uses leg and arm weights and also walks on the treadmill for 30 minutes.  He reports NYHA class II dyspnea, no palpitations or syncope.  He is status post CABG with LIMA to the LAD and SVG to the diagonal in April of this year.  Interval follow-up noted with Dr. Cyndia Bent.  He has healed well.  Last LDL was 96 in April.  I reviewed his medications which are outlined below.  Plans to get his second COVID-19 vaccine which would be appropriate at this point.  Past Medical History:  Diagnosis Date  . Anaphylaxis    Aspirin  . Coronary atherosclerosis of native coronary artery    a. BMS to RCA and LCx 10/09, DES to D1 and RCA 04/2014  b.  CABG with LIMA to LAD and SVG to diagonal April 2021  . Essential hypertension   . Hyperlipidemia   . Myocardial infarction ([place]) 1998  . NSTEMI (non-ST elevated myocardial infarction) ([place]) 03/2008  . Seasonal allergies     Past Surgical History:  Procedure Laterality Date  . CARDIAC CATHETERIZATION     Cath 05/18/2014 DES to D1 and mid RCA  . COLONOSCOPY  05/01/2011   Procedure: COLONOSCOPY;  Surgeon: Daneil Dolin, MD;  [facility];  Service: Endoscopy;  Laterality: N/A;  11:30  . CORONARY ANGIOPLASTY WITH STENT PLACEMENT  07/1996; 03/2008; 05/18/2014   "?3; ?2; 2"  . CORONARY ARTERY BYPASS GRAFT N/A 10/14/2019   Procedure: CORONARY ARTERY BYPASS GRAFTING (CABG) x 2;  Surgeon: Gaye Pollack, MD;  Location: [place];   Service: Open Heart Surgery;  Laterality: N/A;  . ENDOVEIN HARVEST OF GREATER SAPHENOUS VEIN Right 10/14/2019   Procedure: [place] Of Greater Saphenous Vein;  Surgeon: Gaye Pollack, MD;  Location[facility] OR;  Service: Open Heart Surgery;  Laterality: Right;  . INTRAVASCULAR PRESSURE WIRE/FFR STUDY N/A 10/10/2019   Procedure: INTRAVASCULAR PRESSURE WIRE/FFR STUDY;  Surgeon: Nelva Bush, MD;  Location: [place] CV LAB;  Service: Cardiovascular;  Laterality: N/A;  . LEFT HEART CATH AND CORONARY ANGIOGRAPHY N/A 10/10/2019   Procedure: LEFT HEART CATH AND CORONARY ANGIOGRAPHY;  Surgeon: Nelva Bush, MD;  Location: [place] CV LAB;  Service: Cardiovascular;  Laterality: N/A;  . LEFT HEART CATHETERIZATION WITH CORONARY ANGIOGRAM N/A 05/18/2014   Procedure: LEFT HEART CATHETERIZATION WITH CORONARY ANGIOGRAM;  Surgeon: Blane Ohara, MD;  Location: [facility] CATH LAB;  Service: Cardiovascular;  Laterality: N/A;  . TEE WITHOUT CARDIOVERSION N/A 10/14/2019   Procedure: TRANSESOPHAGEAL ECHOCARDIOGRAM (TEE);  Surgeon: Gaye Pollack, MD;  Location: [place];  Service: Open Heart Surgery;  Laterality: N/A;    Current Outpatient Medications  Medication Sig Dispense Refill  . amLODipine (NORVASC) 5 MG tablet Take 1 tablet (5 mg total) by mouth daily. 90 tablet 3  . Ascorbic Acid (VITAMIN C) 1000 MG tablet Take 1,000 mg by mouth daily.      [name]  atorvastatin (LIPITOR) 80 MG tablet Take 1 tablet daily at 6 pm. 90 tablet 3  . clopidogrel (PLAVIX) 75 MG tablet TAKE 1 TABLET BY MOUTH ONCE DAILY - NEED FOLLOW UP FOR REFILLS 90 tablet 3  . furosemide (LASIX) 40 MG tablet Take 1 tablet (40 mg total) by mouth daily. 7 tablet 0  . Metoprolol Tartrate 37.5 MG TABS Take 37.5 mg by mouth 2 (two) times daily. 270 tablet 3  . Omega-3 Fatty Acids (FISH OIL) 1000 MG CAPS Take 1,000 mg by mouth 2 (two) times daily.    . potassium chloride (KLOR-CON) 20 MEQ tablet Take 1 tablet (20 mEq total) by mouth daily. 7 tablet  0  . traMADol (ULTRAM) 50 MG tablet Take 1 tablet (50 mg total) by mouth every 6 (six) hours as needed for moderate pain. 28 tablet 0  . vitamin E 1000 UNIT capsule Take 1 capsule (1,000 Units total) by mouth daily.     No current facility-administered medications for this visit.   Allergies:  Aspirin, Other, and Penicillins   ROS:   No palpitations or syncope.  Physical Exam: VS:  BP (!) 148/74   Pulse 72   Ht 5\' 8"  (1.727 m)   Wt 227 lb 6.4 oz (103.1 kg)   SpO2 96%   BMI 34.58 kg/m , BMI Body mass index is 34.58 kg/m.  Wt Readings from Last 3 Encounters:  01/03/20 227 lb 6.4 oz (103.1 kg)  11/23/19 223 lb (101.2 kg)  11/14/19 226 lb 3.2 oz (102.6 kg)    General: Patient appears comfortable at rest. HEENT: Conjunctiva and lids normal, wearing a mask. Neck: Supple, no elevated JVP or carotid bruits, no thyromegaly. Lungs: Clear to auscultation, nonlabored breathing at rest. Thorax: Well-healed sternal incision. Cardiac: Regular rate and rhythm, no S3, soft systolic murmur. Abdomen: Soft, nontender, bowel sounds present. Extremities: No pitting edema, distal pulses 2+.  ECG:  An ECG dated 10/15/2019 was personally reviewed today and demonstrated:  Sinus rhythm with IVCD and inferolateral repolarization abnormalities.  Recent Labwork: 10/10/2019: ALT 19; AST 24 10/15/2019: Magnesium 2.4 10/18/2019: Hemoglobin 8.7; Platelets 132 11/18/2019: BUN 18; Creatinine, Ser 1.54; Potassium 4.2; Sodium 138     Component Value Date/Time   CHOL 148 10/10/2019 0511   TRIG 110 10/10/2019 0511   HDL 30 (L) 10/10/2019 0511   CHOLHDL 4.9 10/10/2019 0511   VLDL 22 10/10/2019 0511   LDLCALC 96 10/10/2019 0511    Other Studies Reviewed Today:  Echocardiogram 10/10/2019: 1. Left ventricular ejection fraction, by estimation, is 45 to 50%. The  left ventricle has mildly decreased function. The left ventricle  demonstrates global hypokinesis. There is mild concentric left ventricular    hypertrophy. Left ventricular diastolic  parameters are consistent with Grade I diastolic dysfunction (impaired  relaxation).  2. Right ventricular systolic function is normal. The right ventricular  size is normal. There is normal pulmonary artery systolic pressure.  3. The mitral valve is normal in structure. Trivial mitral valve  regurgitation. No evidence of mitral stenosis.  4. The aortic valve is normal in structure. Aortic valve regurgitation is  not visualized. No aortic stenosis is present.  5. Aortic dilatation noted. There is mild dilatation at the level of the  sinuses of Valsalva measuring 37 mm.  6. The inferior vena cava is normal in size with <50% respiratory  variability, suggesting right atrial pressure of 8 mmHg.   Assessment and Plan:  1.  Multivessel CAD status post LIMA to the LAD and  SVG to diagonal in April of this year.  He is doing very well at this point without active angina, back to regular exercise and consistent with medical therapy.  Continue Plavix, Norvasc, and Lipitor.  2.  Mixed hyperlipidemia, he remains on Lipitor.  Last LDL was 96 in April.  Check FLP for his next visit, may need to consider adding Zetia.  3.  CKD stage IIIb, creatinine 1.54 and potassium normal.  4.  Ischemic cardiomyopathy, LVEF 45 to 50% in April.  Plan to reassess down the road as revascularization may lead to further improvement.  Medication Adjustments/Labs and Tests Ordered: Current medicines are reviewed at length with the patient today.  Concerns regarding medicines are outlined above.   Tests Ordered: Orders Placed This Encounter  Procedures  . Lipid Profile    Medication Changes: No orders of the defined types were placed in this encounter.   Disposition:  Follow up 6 months in the [place] office.  Signed, Satira Sark, MD, [facility] 01/03/2020 2:21 PM    [facility] at [facility] [address] [place], [place]  [address] Phone: [phone]; Fax: [phone]

## 2020-01-03 NOTE — Patient Instructions (Signed)
Medication Instructions:  Your physician recommends that you continue on your current medications as directed. Please refer to the Current Medication list given to you today.  *If you need a refill on your cardiac medications before your next appointment, please call your pharmacy*   Lab Work: Fasting Lipid profile JUST BEFORE next visit  If you have labs (blood work) drawn today and your tests are completely normal, you will receive your results only by: [name] MyChart Message (if you have MyChart) OR . A paper copy in the mail If you have any lab test that is abnormal or we need to change your treatment, we will call you to review the results.   Testing/Procedures: None today   Follow-Up: At [facility], you and your health needs are our priority.  As part of our continuing mission to provide you with exceptional heart care, we have created designated Provider Care Teams.  These Care Teams include your primary Cardiologist (physician) and Advanced Practice Providers (APPs -  Physician Assistants and Nurse Practitioners) who all work together to provide you with the care you need, when you need it.  We recommend signing up for the patient portal called "MyChart".  Sign up information is provided on this After Visit Summary.  MyChart is used to connect with patients for Virtual Visits (Telemedicine).  Patients are able to view lab/test results, encounter notes, upcoming appointments, etc.  Non-urgent messages can be sent to your provider as well.   To learn more about what you can do with MyChart, go to [email or web address].    Your next appointment:   6 month(s)  The format for your next appointment:   In Person  Provider:   Rozann Lesches, MD   Other Instructions None      Thank you for choosing [facility] !

## 2020-01-19 ENCOUNTER — Ambulatory Visit: Payer: Self-pay | Attending: Internal Medicine

## 2020-01-19 DIAGNOSIS — Z23 Encounter for immunization: Secondary | ICD-10-CM

## 2020-01-19 NOTE — Progress Notes (Signed)
   Covid-19 Vaccination Clinic  Name:  Corey [name]    MRN: [id number] DOB: 03-02-1936  01/19/2020  Corey [name] was observed post Covid-19 immunization for 15 minutes without incident. He was provided with Vaccine Information Sheet and instruction to access the V-Safe system.   Corey [name] was instructed to call 911 with any severe reactions post vaccine: [name] Difficulty breathing  . Swelling of face and throat  . A fast heartbeat  . A bad rash all over body  . Dizziness and weakness   Immunizations Administered    Name Date Dose VIS Date Route   Moderna COVID-19 Vaccine 01/19/2020 10:07 AM 0.5 mL 05/2019 Intramuscular   Manufacturer: Moderna   Lot: [id number]   [place]: 53794-327-61

## 2020-02-22 ENCOUNTER — Other Ambulatory Visit: Payer: Self-pay

## 2020-02-22 ENCOUNTER — Other Ambulatory Visit (HOSPITAL_COMMUNITY): Payer: Self-pay | Admitting: Physician Assistant

## 2020-02-22 ENCOUNTER — Ambulatory Visit (HOSPITAL_COMMUNITY)
Admission: RE | Admit: 2020-02-22 | Discharge: 2020-02-22 | Disposition: A | Discharge status: Home / Self Care | Payer: Medicare HMO | Source: Ambulatory Visit | Attending: Physician Assistant | Admitting: Physician Assistant

## 2020-02-22 DIAGNOSIS — M542 Cervicalgia: Secondary | ICD-10-CM

## 2020-06-06 ENCOUNTER — Other Ambulatory Visit (HOSPITAL_COMMUNITY)
Admission: RE | Admit: 2020-06-06 | Discharge: 2020-06-06 | Disposition: A | Discharge status: Home / Self Care | Payer: Medicare HMO | Source: Ambulatory Visit | Attending: Cardiology | Admitting: Cardiology

## 2020-06-06 ENCOUNTER — Other Ambulatory Visit: Payer: Self-pay

## 2020-06-06 DIAGNOSIS — E782 Mixed hyperlipidemia: Secondary | ICD-10-CM | POA: Diagnosis present

## 2020-06-06 DIAGNOSIS — I25119 Atherosclerotic heart disease of native coronary artery with unspecified angina pectoris: Secondary | ICD-10-CM | POA: Diagnosis present

## 2020-06-06 LAB — LIPID PANEL
Cholesterol: 181 mg/dL (ref 0–200)
HDL: 38 mg/dL — ABNORMAL LOW (ref >40)
LDL Cholesterol: 118 mg/dL — ABNORMAL HIGH (ref 0–99)
Total CHOL/HDL Ratio: 4.8 RATIO
Triglycerides: 125 mg/dL (ref <150)
VLDL: 25 mg/dL (ref 0–40)

## 2020-06-07 ENCOUNTER — Telehealth: Payer: Self-pay

## 2020-06-07 MED ORDER — EZETIMIBE 10 MG PO TABS: 10.0000 mg | ORAL_TABLET | Freq: Every day | ORAL | 3 refills | Status: DC

## 2020-06-07 NOTE — Telephone Encounter (Signed)
-----   Message from Satira Sark, MD sent at 06/06/2020 11:47 AM EST -----  Results reviewed.  LDL up to 118.  If he has been consistent with Lipitor 80 mg daily, would add Zetia 10 mg daily.

## 2020-06-07 NOTE — Telephone Encounter (Signed)
I spoke with patient.He has consistently taken Atorvastatin. He will start Zetia 10 mg daily. E-scribed to [employer]

## 2020-06-12 ENCOUNTER — Encounter: Payer: Self-pay | Admitting: Cardiology

## 2020-06-12 ENCOUNTER — Ambulatory Visit: Payer: Medicare HMO | Admitting: Cardiology

## 2020-06-12 VITALS — BP 190/80 | HR 84 | Ht 68.0 in | Wt 227.4 lb

## 2020-06-12 DIAGNOSIS — I10 Essential (primary) hypertension: Secondary | ICD-10-CM | POA: Diagnosis not present

## 2020-06-12 DIAGNOSIS — I255 Ischemic cardiomyopathy: Secondary | ICD-10-CM

## 2020-06-12 DIAGNOSIS — I25119 Atherosclerotic heart disease of native coronary artery with unspecified angina pectoris: Secondary | ICD-10-CM

## 2020-06-12 NOTE — Progress Notes (Signed)
Cardiology Office Note  Date: 06/12/2020   ID: Corey [name], DOB [birth year], MRN [id number]  PCP:  [facility]  Cardiologist:  Rozann Lesches, MD Electrophysiologist:  None   Chief Complaint  Patient presents with  . Cardiac follow-up    History of Present Illness: Corey [name] is an 84 y.o. male last seen in July. He presents for a routine visit. He does not report any angina symptoms. Has not been exercising as regularly however. Blood pressure was significantly elevated today, this is not typically the case. He states that he has been taking his medications regularly. He does have a blood pressure cuff at home.  Recent LDL was 118.  Zetia was added to high-dose Lipitor.  He reports having intermittent problems with neck pain and stiffness, sounds possibly inflammatory and musculoskeletal in etiology. He has been treated with anti-inflammatory medicines by his PCP.  Past Medical History:  Diagnosis Date  . Anaphylaxis    Aspirin  . Coronary atherosclerosis of native coronary artery    a. BMS to RCA and LCx 10/09, DES to D1 and RCA 04/2014  b.  CABG with LIMA to LAD and SVG to diagonal April 2021  . Essential hypertension   . Hyperlipidemia   . Myocardial infarction ([place]) 1998  . NSTEMI (non-ST elevated myocardial infarction) ([place]) 03/2008  . Seasonal allergies     Past Surgical History:  Procedure Laterality Date  . CARDIAC CATHETERIZATION     Cath 05/18/2014 DES to D1 and mid RCA  . COLONOSCOPY  05/01/2011   Procedure: COLONOSCOPY;  Surgeon: Daneil Dolin, MD;  [facility];  Service: Endoscopy;  Laterality: N/A;  11:30  . CORONARY ANGIOPLASTY WITH STENT PLACEMENT  07/1996; 03/2008; 05/18/2014   "?3; ?2; 2"  . CORONARY ARTERY BYPASS GRAFT N/A 10/14/2019   Procedure: CORONARY ARTERY BYPASS GRAFTING (CABG) x 2;  Surgeon: Gaye Pollack, MD;  Location: [place];  Service: Open Heart Surgery;  Laterality: N/A;  . ENDOVEIN  HARVEST OF GREATER SAPHENOUS VEIN Right 10/14/2019   Procedure: [place] Of Greater Saphenous Vein;  Surgeon: Gaye Pollack, MD;  Location: [facility];  Service: Open Heart Surgery;  Laterality: Right;  . INTRAVASCULAR PRESSURE WIRE/FFR STUDY N/A 10/10/2019   Procedure: INTRAVASCULAR PRESSURE WIRE/FFR STUDY;  Surgeon: Nelva Bush, MD;  Location: [place] CV LAB;  Service: Cardiovascular;  Laterality: N/A;  . LEFT HEART CATH AND CORONARY ANGIOGRAPHY N/A 10/10/2019   Procedure: LEFT HEART CATH AND CORONARY ANGIOGRAPHY;  Surgeon: Nelva Bush, MD;  Location: [place] CV LAB;  Service: Cardiovascular;  Laterality: N/A;  . LEFT HEART CATHETERIZATION WITH CORONARY ANGIOGRAM N/A 05/18/2014   Procedure: LEFT HEART CATHETERIZATION WITH CORONARY ANGIOGRAM;  Surgeon: Blane Ohara, MD;  Location: [facility] CATH LAB;  Service: Cardiovascular;  Laterality: N/A;  . TEE WITHOUT CARDIOVERSION N/A 10/14/2019   Procedure: TRANSESOPHAGEAL ECHOCARDIOGRAM (TEE);  Surgeon: Gaye Pollack, MD;  Location: [place];  Service: Open Heart Surgery;  Laterality: N/A;    Current Outpatient Medications  Medication Sig Dispense Refill  . amLODipine (NORVASC) 5 MG tablet Take 1 tablet (5 mg total) by mouth daily. 90 tablet 3  . Ascorbic Acid (VITAMIN C) 1000 MG tablet Take 1,000 mg by mouth daily.    [name] atorvastatin (LIPITOR) 80 MG tablet Take 1 tablet daily at 6 pm. 90 tablet 3  . clopidogrel (PLAVIX) 75 MG tablet TAKE 1 TABLET BY MOUTH ONCE DAILY - NEED FOLLOW UP FOR REFILLS  90 tablet 3  . ezetimibe (ZETIA) 10 MG tablet Take 1 tablet (10 mg total) by mouth daily. 90 tablet 3  . furosemide (LASIX) 40 MG tablet Take 1 tablet (40 mg total) by mouth daily. 7 tablet 0  . Metoprolol Tartrate 37.5 MG TABS Take 37.5 mg by mouth 2 (two) times daily. 270 tablet 3  . Omega-3 Fatty Acids (FISH OIL) 1000 MG CAPS Take 1,000 mg by mouth 2 (two) times daily.    . potassium chloride (KLOR-CON) 20 MEQ tablet Take 1 tablet (20 mEq  total) by mouth daily. 7 tablet 0  . traMADol (ULTRAM) 50 MG tablet Take 1 tablet (50 mg total) by mouth every 6 (six) hours as needed for moderate pain. 28 tablet 0  . vitamin E 1000 UNIT capsule Take 1 capsule (1,000 Units total) by mouth daily.     No current facility-administered medications for this visit.   Allergies:  Aspirin, Other, and Penicillins   ROS: No palpitations or syncope.  Physical Exam: VS:  BP (!) 190/80   Pulse 84   Ht 5\' 8"  (1.727 m)   Wt 227 lb 6.4 oz (103.1 kg)   SpO2 97%   BMI 34.58 kg/m , BMI Body mass index is 34.58 kg/m.  Wt Readings from Last 3 Encounters:  06/12/20 227 lb 6.4 oz (103.1 kg)  01/03/20 227 lb 6.4 oz (103.1 kg)  11/23/19 223 lb (101.2 kg)    General: Patient appears comfortable at rest. HEENT: Conjunctiva and lids normal, wearing a mask. Neck: Supple, no elevated JVP or carotid bruits, no thyromegaly. Lungs: Clear to auscultation, nonlabored breathing at rest. Cardiac: Regular rate and rhythm, no S3, soft systolic murmur. Extremities: No pitting edema.  ECG:  An ECG dated 10/15/2019 was personally reviewed today and demonstrated:  Sinus rhythm with IVCD and inferolateral repolarization abnormalities.  Recent Labwork: 10/10/2019: ALT 19; AST 24 10/15/2019: Magnesium 2.4 10/18/2019: Hemoglobin 8.7; Platelets 132 11/18/2019: BUN 18; Creatinine, Ser 1.54; Potassium 4.2; Sodium 138     Component Value Date/Time   CHOL 181 06/06/2020 1054   TRIG 125 06/06/2020 1054   HDL 38 (L) 06/06/2020 1054   CHOLHDL 4.8 06/06/2020 1054   VLDL 25 06/06/2020 1054   LDLCALC 118 (H) 06/06/2020 1054    Other Studies Reviewed Today:  Echocardiogram 10/10/2019: 1. Left ventricular ejection fraction, by estimation, is 45 to 50%. The  left ventricle has mildly decreased function. The left ventricle  demonstrates global hypokinesis. There is mild concentric left ventricular  hypertrophy. Left ventricular diastolic  parameters are consistent with Grade  I diastolic dysfunction (impaired  relaxation).  2. Right ventricular systolic function is normal. The right ventricular  size is normal. There is normal pulmonary artery systolic pressure.  3. The mitral valve is normal in structure. Trivial mitral valve  regurgitation. No evidence of mitral stenosis.  4. The aortic valve is normal in structure. Aortic valve regurgitation is  not visualized. No aortic stenosis is present.  5. Aortic dilatation noted. There is mild dilatation at the level of the  sinuses of Valsalva measuring 37 mm.  6. The inferior vena cava is normal in size with <50% respiratory  variability, suggesting right atrial pressure of 8 mmHg.   Assessment and Plan:  1. Multivessel CAD status post CABG in April. I have encouraged him to get back to his regular exercise plan, no angina reported at this time on medical therapy. Continue Plavix, Norvasc, Lipitor, and Zetia.  2. Essential hypertension, blood pressure elevated  today, not typically the case. I have asked him to track this with his home cuff and report back if trend remains elevated. Could either uptitrate Norvasc or add ARB.  3. Ischemic cardiomyopathy with LVEF 45 to 50%.  4. CKD stage IIIb, creatinine 1.54.  Medication Adjustments/Labs and Tests Ordered: Current medicines are reviewed at length with the patient today.  Concerns regarding medicines are outlined above.   Tests Ordered: No orders of the defined types were placed in this encounter.   Medication Changes: No orders of the defined types were placed in this encounter.   Disposition:  Follow up 6 months.  Signed, Satira Sark, MD, [facility] 06/12/2020 4:04 PM    [place] at [employer], [place], [place] [address] Phone: [phone]; Fax: [phone]

## 2020-06-12 NOTE — Patient Instructions (Addendum)

## 2020-12-12 ENCOUNTER — Ambulatory Visit (HOSPITAL_COMMUNITY)
Admission: RE | Admit: 2020-12-12 | Discharge: 2020-12-12 | Disposition: A | Discharge status: Home / Self Care | Payer: Medicare HMO | Source: Ambulatory Visit | Attending: Internal Medicine | Admitting: Internal Medicine

## 2020-12-12 ENCOUNTER — Other Ambulatory Visit: Payer: Self-pay

## 2020-12-12 ENCOUNTER — Other Ambulatory Visit (HOSPITAL_COMMUNITY): Payer: Self-pay | Admitting: Internal Medicine

## 2020-12-12 DIAGNOSIS — R109 Unspecified abdominal pain: Secondary | ICD-10-CM | POA: Insufficient documentation

## 2020-12-14 ENCOUNTER — Telehealth: Payer: Self-pay

## 2020-12-14 MED ORDER — AMLODIPINE BESYLATE 10 MG PO TABS: 10.0000 mg | ORAL_TABLET | Freq: Every day | ORAL | 3 refills | Status: DC

## 2020-12-14 NOTE — Telephone Encounter (Signed)
Pt walked in with new rx from pcp for amlodipine 10 mg qd (was on 5 mg), changed on pt med list

## 2021-03-19 ENCOUNTER — Other Ambulatory Visit: Payer: Self-pay | Admitting: Cardiology

## 2021-04-19 ENCOUNTER — Other Ambulatory Visit: Payer: Self-pay | Admitting: Cardiology

## 2021-05-03 ENCOUNTER — Telehealth: Payer: Self-pay

## 2021-05-03 MED ORDER — AMLODIPINE BESYLATE 10 MG PO TABS: 10.0000 mg | ORAL_TABLET | Freq: Every day | ORAL | 2 refills | Status: DC

## 2021-05-03 NOTE — Telephone Encounter (Signed)
Pt walked into office for a medication refill request for Amlodipine 10 mg tablet. 90 days.

## 2021-05-04 ENCOUNTER — Other Ambulatory Visit: Payer: Self-pay | Admitting: Cardiology

## 2021-05-24 ENCOUNTER — Other Ambulatory Visit: Payer: Self-pay | Admitting: Cardiology

## 2021-06-04 NOTE — Progress Notes (Signed)
Cardiology Office Note    Date:  06/11/2021   ID:  [name], DOB 03-21-1936, MRN [id number]   PCP:  [facility]  Cardiologist:  Rozann Lesches, MD   Advanced Practice Provider:  No care team member to display Electrophysiologist:  None   [id number]   Chief Complaint  Patient presents with  . Shortness of Breath     History of Present Illness:  Corey [name] is a 85 y.o. male with history of CAD S/P BMS to RCA and LCx 10/09, DES to D1 and RCA 04/2014  CABG with LIMA to LAD and SVG to diagonal April 2021,ICM LVEF 45-50%, HTN, CKD 3b.  Patient last saw Dr. Domenic Polite 05/2020 and BP was running high. He asked patient to call in readings from home.  Patient comes in for yearly f/u. Complains of DOE for the past several months. Chronic leg edema unchanged. Weight down 1 lb from last year. He gets short of breath in am when he wakes up. He thinks the heat bothers him. He's wheezing a lot and coughing up white phlegm. Walks on treadmill 30 min at the [facility]  without problem. No fever. BP running 160's/50-60. Trying to stay away from salt but has probably gotten extra. Eats out frequently.     Past Medical History:  Diagnosis Date  . Anaphylaxis    Aspirin  . Coronary atherosclerosis of native coronary artery    a. BMS to RCA and LCx 10/09, DES to D1 and RCA 04/2014  b.  CABG with LIMA to LAD and SVG to diagonal April 2021  . Essential hypertension   . Hyperlipidemia   . Myocardial infarction ([place]) 1998  . NSTEMI (non-ST elevated myocardial infarction) ([place]) 03/2008  . Seasonal allergies     Past Surgical History:  Procedure Laterality Date  . CARDIAC CATHETERIZATION     Cath 05/18/2014 DES to D1 and mid RCA  . COLONOSCOPY  05/01/2011   Procedure: COLONOSCOPY;  Surgeon: Daneil Dolin, MD;  [facility];  Service: Endoscopy;  Laterality: N/A;  11:30  . CORONARY ANGIOPLASTY WITH STENT PLACEMENT   07/1996; 03/2008; 05/18/2014   "?3; ?2; 2"  . CORONARY ARTERY BYPASS GRAFT N/A 10/14/2019   Procedure: CORONARY ARTERY BYPASS GRAFTING (CABG) x 2;  Surgeon: Gaye Pollack, MD;  Location: [place];  Service: Open Heart Surgery;  Laterality: N/A;  . ENDOVEIN HARVEST OF GREATER SAPHENOUS VEIN Right 10/14/2019   Procedure: [place] Of Greater Saphenous Vein;  Surgeon: Gaye Pollack, MD;  Location: [facility] OR;  Service: Open Heart Surgery;  Laterality: Right;  . INTRAVASCULAR PRESSURE WIRE/FFR STUDY N/A 10/10/2019   Procedure: INTRAVASCULAR PRESSURE WIRE/FFR STUDY;  Surgeon: Nelva Bush, MD;  Location: [place] CV LAB;  Service: Cardiovascular;  Laterality: N/A;  . LEFT HEART CATH AND CORONARY ANGIOGRAPHY N/A 10/10/2019   Procedure: LEFT HEART CATH AND CORONARY ANGIOGRAPHY;  Surgeon: Nelva Bush, MD;  Location: [place] CV LAB;  Service: Cardiovascular;  Laterality: N/A;  . LEFT HEART CATHETERIZATION WITH CORONARY ANGIOGRAM N/A 05/18/2014   Procedure: LEFT HEART CATHETERIZATION WITH CORONARY ANGIOGRAM;  Surgeon: Blane Ohara, MD;  Location: [facility] CATH LAB;  Service: Cardiovascular;  Laterality: N/A;  . TEE WITHOUT CARDIOVERSION N/A 10/14/2019   Procedure: TRANSESOPHAGEAL ECHOCARDIOGRAM (TEE);  Surgeon: Gaye Pollack, MD;  Location: [place];  Service: Open Heart Surgery;  Laterality: N/A;    Current Medications: Current Meds  Medication Sig  .  amLODipine (NORVASC) 10 MG tablet Take 1 tablet (10 mg total) by mouth daily.  . Ascorbic Acid (VITAMIN C) 1000 MG tablet Take 1,000 mg by mouth daily.  [name] atorvastatin (LIPITOR) 80 MG tablet Take 1 tablet daily at 6 pm.  . clopidogrel (PLAVIX) 75 MG tablet TAKE 1 TABLET BY MOUTH ONCE DAILY - NEED FOLLOW UP FOR REFILLS  . ezetimibe (ZETIA) 10 MG tablet Take 1 tablet by mouth once daily  . furosemide (LASIX) 40 MG tablet Take 1 tablet (40 mg total) by mouth daily.  . Metoprolol Tartrate 37.5 MG TABS TAKE 1 TABLET BY MOUTH TWICE DAILY .  APPOINTMENT REQUIRED FOR FUTURE REFILLS  . Omega-3 Fatty Acids (FISH OIL) 1000 MG CAPS Take 1,000 mg by mouth 2 (two) times daily.  . potassium chloride (KLOR-CON) 20 MEQ tablet Take 1 tablet (20 mEq total) by mouth daily.  . traMADol (ULTRAM) 50 MG tablet Take 1 tablet (50 mg total) by mouth every 6 (six) hours as needed for moderate pain.  . vitamin E 1000 UNIT capsule Take 1 capsule (1,000 Units total) by mouth daily.     Allergies:   Aspirin, Other, and Penicillins   Social History   Socioeconomic History  . Marital status: Widowed    Spouse name: Not on file  . Number of children: Not on file  . Years of education: Not on file  . Highest education level: Not on file  Occupational History  . Occupation: Retired    Comment: Quarry manager  Tobacco Use  . Smoking status: Former    Packs/day: 2.00    Years: 45.00    Pack years: 90.00    Types: Cigarettes    Quit date: 07/31/1996    Years since quitting: 24.8  . Smokeless tobacco: Never  Vaping Use  . Vaping Use: Never used  Substance and Sexual Activity  . Alcohol use: No  . Drug use: No  . Sexual activity: Not Currently  Other Topics Concern  . Not on file  Social History Narrative  . Not on file   Social Determinants of Health   Financial Resource Strain: Not on file  Food Insecurity: Not on file  Transportation Needs: Not on file  Physical Activity: Not on file  Stress: Not on file  Social Connections: Not on file     Family History:  The patient's  family history includes Hypertension in an other family member.   ROS:   Please see the history of present illness.    ROS All other systems reviewed and are negative.   PHYSICAL EXAM:   VS:  BP (!) 160/76   Pulse 78   Ht $R'5\' 8"'Mp$  (1.727 m)   Wt 226 lb (102.5 kg)   SpO2 97%   BMI 34.36 kg/m   Physical Exam  GEN: Well nourished, well developed, in no acute distress  Neck: increased JVD, no carotid bruits, or masses Cardiac:RRR; no murmurs, rubs, or gallops   Respiratory:  basilar crackles, and upper respiratory wheezing GI: soft, nontender, nondistended, + BS Ext: leg edema bilaterally,  without cyanosis, clubbing,  Good distal pulses bilaterally Neuro:  Alert and Oriented x 3 Psych: euthymic mood, full affect  Wt Readings from Last 3 Encounters:  06/11/21 226 lb (102.5 kg)  06/12/20 227 lb 6.4 oz (103.1 kg)  01/03/20 227 lb 6.4 oz (103.1 kg)      Studies/Labs Reviewed:   EKG:  EKG is  ordered today.  The ekg ordered today demonstrates NSR with  LVH and repolarization changes no acute change.  Recent Labs: No results found for requested labs within last 8760 hours.   Lipid Panel    Component Value Date/Time   CHOL 181 06/06/2020 1054   TRIG 125 06/06/2020 1054   HDL 38 (L) 06/06/2020 1054   CHOLHDL 4.8 06/06/2020 1054   VLDL 25 06/06/2020 1054   LDLCALC 118 (H) 06/06/2020 1054    Additional studies/ records that were reviewed today include:  Echocardiogram 10/10/2019:  1. Left ventricular ejection fraction, by estimation, is 45 to 50%. The  left ventricle has mildly decreased function. The left ventricle  demonstrates global hypokinesis. There is mild concentric left ventricular  hypertrophy. Left ventricular diastolic  parameters are consistent with Grade I diastolic dysfunction (impaired  relaxation).   2. Right ventricular systolic function is normal. The right ventricular  size is normal. There is normal pulmonary artery systolic pressure.   3. The mitral valve is normal in structure. Trivial mitral valve  regurgitation. No evidence of mitral stenosis.   4. The aortic valve is normal in structure. Aortic valve regurgitation is  not visualized. No aortic stenosis is present.   5. Aortic dilatation noted. There is mild dilatation at the level of the  sinuses of Valsalva measuring 37 mm.   6. The inferior vena cava is normal in size with <50% respiratory  variability, suggesting right atrial pressure of 8 mmHg.       Risk Assessment/Calculations:         ASSESSMENT:    1. Coronary artery disease involving native coronary artery of native heart with angina pectoris ([place])   2. Ischemic cardiomyopathy   3. Acute on chronic systolic CHF (congestive heart failure) ([place])   4. Essential hypertension   5. CKD (chronic kidney disease) stage 2, GFR 60-89 ml/min   6. SOB (shortness of breath)      PLAN:  In order of problems listed above:  CAD S/P BMS to RCA and LCx 10/09, DES to D1 and RCA 04/2014  CABG with LIMA to LAD and SVG to diagonal April 2021 on Plavix lipitor Zetia, and amlodipine-no angina  ICM LVEF 45-50% now with acute heart failure. Will repeat echo. See changes below.  Acute on chronic systolic CHF.  Increase Lasix to 80 mg daily and potassium 40 mill equivalents daily for 1 week then back to 40 mg of Lasix and 20 mill equivalents of potassium.  Limit salt.  Check be met and BNP as well as chest x-ray today.  He has a lot of wheezing on exam as well.  Recommend he see his PCP  HTN blood pressure elevated today.  With increase in Lasix we will hold off on changing anything else today but he may need another agent  CKD 3b creatinine 1.54 in June  Hyperlipidemia on atorvastatin and Zetia.  LDL 61 11/2020  Shared Decision Making/Informed Consent        Medication Adjustments/Labs and Tests Ordered: Current medicines are reviewed at length with the patient today.  Concerns regarding medicines are outlined above.  Medication changes, Labs and Tests ordered today are listed in the Patient Instructions below. Patient Instructions  Medication Instructions:   INCREASE Lasix to 80 mg daily for 1 week then go back to 40 mg daily   INCREASE Potassium to 40 meq daily for 1 week then go back to 20 meq daily   Labwork:  BNP, BMET today  Testing/Procedures: Your physician has requested that you have an echocardiogram. Echocardiography is  a painless test that uses sound waves to create  images of your heart. It provides your doctor with information about the size and shape of your heart and how well your heart's chambers and valves are working. This procedure takes approximately one hour. There are no restrictions for this procedure.     Get a chest x-ray today  Follow-Up: 1-2 weeks   Any Other Special Instructions Will Be Listed Below (If Applicable).  If you need a refill on your cardiac medications before your next appointment, please call your pharmacy.   Signed, Ermalinda Barrios, PA-C  06/11/2021 1:37 PM    [facility] [place], [place], [place]  [address] Phone: [phone]; Fax: [phone]

## 2021-06-11 ENCOUNTER — Encounter: Payer: Self-pay | Admitting: Physician Assistant

## 2021-06-11 ENCOUNTER — Other Ambulatory Visit (HOSPITAL_COMMUNITY)
Admission: RE | Admit: 2021-06-11 | Discharge: 2021-06-11 | Disposition: A | Discharge status: Home / Self Care | Payer: Medicare HMO | Source: Ambulatory Visit | Attending: Physician Assistant | Admitting: Physician Assistant

## 2021-06-11 ENCOUNTER — Ambulatory Visit (HOSPITAL_COMMUNITY)
Admission: RE | Admit: 2021-06-11 | Discharge: 2021-06-11 | Disposition: A | Discharge status: Home / Self Care | Payer: Medicare HMO | Source: Ambulatory Visit | Attending: Physician Assistant | Admitting: Physician Assistant

## 2021-06-11 ENCOUNTER — Ambulatory Visit: Payer: Medicare HMO | Admitting: Physician Assistant

## 2021-06-11 ENCOUNTER — Other Ambulatory Visit: Payer: Self-pay

## 2021-06-11 VITALS — BP 160/76 | HR 78 | Ht 68.0 in | Wt 226.0 lb

## 2021-06-11 DIAGNOSIS — I255 Ischemic cardiomyopathy: Secondary | ICD-10-CM | POA: Insufficient documentation

## 2021-06-11 DIAGNOSIS — I5023 Acute on chronic systolic (congestive) heart failure: Secondary | ICD-10-CM

## 2021-06-11 DIAGNOSIS — N182 Chronic kidney disease, stage 2 (mild): Secondary | ICD-10-CM

## 2021-06-11 DIAGNOSIS — I10 Essential (primary) hypertension: Secondary | ICD-10-CM | POA: Diagnosis not present

## 2021-06-11 DIAGNOSIS — R0602 Shortness of breath: Secondary | ICD-10-CM

## 2021-06-11 DIAGNOSIS — R062 Wheezing: Secondary | ICD-10-CM | POA: Insufficient documentation

## 2021-06-11 DIAGNOSIS — I25119 Atherosclerotic heart disease of native coronary artery with unspecified angina pectoris: Secondary | ICD-10-CM | POA: Diagnosis not present

## 2021-06-11 LAB — BASIC METABOLIC PANEL
Anion gap: 7 (ref 5–15)
BUN: 19 mg/dL (ref 8–23)
CO2: 23 mmol/L (ref 22–32)
Calcium: 9.1 mg/dL (ref 8.9–10.3)
Chloride: 110 mmol/L (ref 98–111)
Creatinine, Ser: 1.48 mg/dL — ABNORMAL HIGH (ref 0.61–1.24)
GFR, Estimated: 47 mL/min — ABNORMAL LOW (ref >60)
Glucose, Bld: 104 mg/dL — ABNORMAL HIGH (ref 70–99)
Potassium: 3.8 mmol/L (ref 3.5–5.1)
Sodium: 140 mmol/L (ref 135–145)

## 2021-06-11 LAB — BRAIN NATRIURETIC PEPTIDE: B Natriuretic Peptide: 1595 pg/mL — ABNORMAL HIGH (ref 0.0–100.0)

## 2021-06-11 NOTE — Patient Instructions (Addendum)
Medication Instructions:   INCREASE Lasix to 80 mg daily for 1 week then go back to 40 mg daily   INCREASE Potassium to 40 meq daily for 1 week then go back to 20 meq daily   Labwork:  BNP, BMET today  Testing/Procedures: Your physician has requested that you have an echocardiogram. Echocardiography is a painless test that uses sound waves to create images of your heart. It provides your doctor with information about the size and shape of your heart and how well your hearts chambers and valves are working. This procedure takes approximately one hour. There are no restrictions for this procedure.     Get a chest x-ray today  Follow-Up: 1-2 weeks   Any Other Special Instructions Will Be Listed Below (If Applicable).  If you need a refill on your cardiac medications before your next appointment, please call your pharmacy.

## 2021-06-12 ENCOUNTER — Telehealth: Payer: Self-pay

## 2021-06-12 MED ORDER — FUROSEMIDE 40 MG PO TABS: 40.0000 mg | ORAL_TABLET | Freq: Every day | ORAL | 2 refills | Status: DC

## 2021-06-12 MED ORDER — POTASSIUM CHLORIDE CRYS ER 20 MEQ PO TBCR: 20.0000 meq | EXTENDED_RELEASE_TABLET | Freq: Every day | ORAL | 2 refills | Status: DC

## 2021-06-12 NOTE — Telephone Encounter (Signed)
I spoke with patient this morning to discuss bnp results. He tells me he went over his medications at home and found that he had not been taking lasix or potassium since June. He had only been given rx by Dr.Bartle for 7 days and never requested refill. I told him to resume lasix 40 mg and potassium 20 meq (vs the double dose we had asked him to take) since he had not been taking it.He will update [place].     I will [place], PA-C

## 2021-06-12 NOTE — Telephone Encounter (Signed)
-----   Message from Imogene Burn, PA-C sent at 06/12/2021  7:44 AM EST ----- Heart failure marker quite high as suspected. Kidney function up but will monitor. Please call if you don't put fluid out in the couple days.

## 2021-06-13 ENCOUNTER — Telehealth: Payer: Self-pay

## 2021-06-13 NOTE — Telephone Encounter (Signed)
-----   Message from Imogene Burn, PA-C sent at 06/13/2021  7:53 AM EST ----- CXR confirms CHF. We have already addressed this. Please let us know if you aren't putting fluid out. thanks

## 2021-06-13 NOTE — Telephone Encounter (Signed)
Patient notified and verbalized understanding. Pt had no questions or concerns at this time 

## 2021-06-13 NOTE — Telephone Encounter (Signed)
Error

## 2021-06-28 ENCOUNTER — Encounter: Payer: Self-pay | Admitting: Cardiovascular Disease

## 2021-06-28 ENCOUNTER — Other Ambulatory Visit: Payer: Self-pay

## 2021-06-28 ENCOUNTER — Ambulatory Visit: Payer: Medicare HMO | Admitting: Cardiovascular Disease

## 2021-06-28 VITALS — BP 130/70 | HR 88 | Ht 68.0 in | Wt 211.0 lb

## 2021-06-28 DIAGNOSIS — E782 Mixed hyperlipidemia: Secondary | ICD-10-CM | POA: Diagnosis not present

## 2021-06-28 DIAGNOSIS — I10 Essential (primary) hypertension: Secondary | ICD-10-CM

## 2021-06-28 DIAGNOSIS — I255 Ischemic cardiomyopathy: Secondary | ICD-10-CM | POA: Diagnosis not present

## 2021-06-28 DIAGNOSIS — N182 Chronic kidney disease, stage 2 (mild): Secondary | ICD-10-CM | POA: Diagnosis not present

## 2021-06-28 DIAGNOSIS — Z79899 Other long term (current) drug therapy: Secondary | ICD-10-CM

## 2021-06-28 MED ORDER — CARVEDILOL 3.125 MG PO TABS: 3.1250 mg | ORAL_TABLET | Freq: Two times a day (BID) | ORAL | 3 refills | Status: DC

## 2021-06-28 MED ORDER — LOSARTAN POTASSIUM 25 MG PO TABS: 25.0000 mg | ORAL_TABLET | Freq: Every day | ORAL | 3 refills | Status: DC

## 2021-06-28 NOTE — Patient Instructions (Signed)
Medication Instructions:  Stop Taking Norvasc  Start Taking Coreg 3.125 Two Times Daily  Start Taking Cozaar 25 mg Daily   *If you need a refill on your cardiac medications before your next appointment, please call your pharmacy*   Lab Work: Your physician recommends that you return for lab work in: 2 Weeks ( BMET, BNP)   If you have labs (blood work) drawn today and your tests are completely normal, you will receive your results only by: [employer] (if you have MyChart) OR A paper copy in the mail If you have any lab test that is abnormal or we need to change your treatment, we will call you to review the results.   Testing/Procedures: NONE    Follow-Up: At [facility], you and your health needs are our priority.  As part of our continuing mission to provide you with exceptional heart care, we have created designated Provider Care Teams.  These Care Teams include your primary Cardiologist (physician) and Advanced Practice Providers (APPs -  Physician Assistants and Nurse Practitioners) who all work together to provide you with the care you need, when you need it.  We recommend signing up for the patient portal called "MyChart".  Sign up information is provided on this After Visit Summary.  MyChart is used to connect with patients for Virtual Visits (Telemedicine).  Patients are able to view lab/test results, encounter notes, upcoming appointments, etc.  Non-urgent messages can be sent to your provider as well.   To learn more about what you can do with MyChart, go to [email or web address].    Your next appointment:    Next available   The format for your next appointment:   In Person  Provider:   Rozann Lesches, MD    Other Instructions Thank you for choosing [facility]!

## 2021-06-28 NOTE — Progress Notes (Signed)
Cardiology Office Note    Date:  06/28/2021   ID:  Corey [name], DOB [birth year], MRN [id number]   PCP:  [facility], [facility]  Cardiologist:  Rozann Lesches, MD   Advanced Practice Provider:  No care team member to display Electrophysiologist:  None   [id number]   History of Present Illness:  Corey [name] is a 85 y.o. male with history of CAD S/P BMS to RCA and LCx 10/09, DES to D1 and RCA 04/2014  CABG with LIMA to LAD and SVG to diagonal April 2021,ICM LVEF 45-50%, HTN, CKD 3b.  Patient last saw Dr. Domenic Polite 05/2020 and BP was running high. He asked patient to call in readings from home.  Added on to schedule for f/u recent CHF Seen by PA 06/11/21 with edema, dyspnea CXR with CHF and BNP 1595.  Has improved to baseline with lasix. However he is not on ARB/ACE/ARNI nor beta blocker He is on Norvasc Discussed continuing lasix and Kdur with addition of beta blocker and low dose ARB baseline CR 1.5 and K 3.8     Past Medical History:  Diagnosis Date   Anaphylaxis    Aspirin   Coronary atherosclerosis of native coronary artery    a. BMS to RCA and LCx 10/09, DES to D1 and RCA 04/2014  b.  CABG with LIMA to LAD and SVG to diagonal April 2021   Essential hypertension    Hyperlipidemia    Myocardial infarction [facility]) 1998   NSTEMI (non-ST elevated myocardial infarction) ([place]) 03/2008   Seasonal allergies     Past Surgical History:  Procedure Laterality Date   CARDIAC CATHETERIZATION     Cath 05/18/2014 DES to D1 and mid RCA   COLONOSCOPY  05/01/2011   Procedure: COLONOSCOPY;  Surgeon: Daneil Dolin, MD;  [facility];  Service: Endoscopy;  Laterality: N/A;  11:30   CORONARY ANGIOPLASTY WITH STENT PLACEMENT  07/1996; 03/2008; 05/18/2014   "?3; ?2; 2"   CORONARY ARTERY BYPASS GRAFT N/A 10/14/2019   Procedure: CORONARY ARTERY BYPASS GRAFTING (CABG) x 2;  Surgeon: Gaye Pollack, MD;  Location: [place];   Service: Open Heart Surgery;  Laterality: N/A;   ENDOVEIN HARVEST OF GREATER SAPHENOUS VEIN Right 10/14/2019   Procedure: [place] Of Greater Saphenous Vein;  Surgeon: Gaye Pollack, MD;  Location: [facility];  Service: Open Heart Surgery;  Laterality: Right;   INTRAVASCULAR PRESSURE WIRE/FFR STUDY N/A 10/10/2019   Procedure: INTRAVASCULAR PRESSURE WIRE/FFR STUDY;  Surgeon: Nelva Bush, MD;  Location: [place] CV LAB;  Service: Cardiovascular;  Laterality: N/A;   LEFT HEART CATH AND CORONARY ANGIOGRAPHY N/A 10/10/2019   Procedure: LEFT HEART CATH AND CORONARY ANGIOGRAPHY;  Surgeon: Nelva Bush, MD;  Location: [place] CV LAB;  Service: Cardiovascular;  Laterality: N/A;   LEFT HEART CATHETERIZATION WITH CORONARY ANGIOGRAM N/A 05/18/2014   Procedure: LEFT HEART CATHETERIZATION WITH CORONARY ANGIOGRAM;  Surgeon: Blane Ohara, MD;  Location: [facility] CATH LAB;  Service: Cardiovascular;  Laterality: N/A;   TEE WITHOUT CARDIOVERSION N/A 10/14/2019   Procedure: TRANSESOPHAGEAL ECHOCARDIOGRAM (TEE);  Surgeon: Gaye Pollack, MD;  Location: [place];  Service: Open Heart Surgery;  Laterality: N/A;    Current Medications: Current Meds  Medication Sig   Ascorbic Acid (VITAMIN C) 1000 MG tablet Take 1,000 mg by mouth daily.   atorvastatin (LIPITOR) 80 MG tablet Take 1 tablet daily at 6 pm.   carvedilol (COREG) 3.125 MG  tablet Take 1 tablet (3.125 mg total) by mouth 2 (two) times daily.   clopidogrel (PLAVIX) 75 MG tablet TAKE 1 TABLET BY MOUTH ONCE DAILY - NEED FOLLOW UP FOR REFILLS   ezetimibe (ZETIA) 10 MG tablet Take 1 tablet by mouth once daily   furosemide (LASIX) 40 MG tablet Take 1 tablet (40 mg total) by mouth daily.   losartan (COZAAR) 25 MG tablet Take 1 tablet (25 mg total) by mouth daily.   Metoprolol Tartrate 37.5 MG TABS TAKE 1 TABLET BY MOUTH TWICE DAILY . APPOINTMENT REQUIRED FOR FUTURE REFILLS   Omega-3 Fatty Acids (FISH OIL) 1000 MG CAPS Take 1,000 mg by mouth 2 (two) times  daily.   potassium chloride SA (KLOR-CON M) 20 MEQ tablet Take 1 tablet (20 mEq total) by mouth daily.   traMADol (ULTRAM) 50 MG tablet Take 1 tablet (50 mg total) by mouth every 6 (six) hours as needed for moderate pain.   vitamin E 1000 UNIT capsule Take 1 capsule (1,000 Units total) by mouth daily.   [DISCONTINUED] amLODipine (NORVASC) 10 MG tablet Take 1 tablet (10 mg total) by mouth daily.     Allergies:   Aspirin, Other, and Penicillins   Social History   Socioeconomic History   Marital status: Widowed    Spouse name: Not on file   Number of children: Not on file   Years of education: Not on file   Highest education level: Not on file  Occupational History   Occupation: Retired    Comment: Quarry manager  Tobacco Use   Smoking status: Former    Packs/day: 2.00    Years: 45.00    Pack years: 90.00    Types: Cigarettes    Quit date: 07/31/1996    Years since quitting: 24.9   Smokeless tobacco: Never  Vaping Use   Vaping Use: Never used  Substance and Sexual Activity   Alcohol use: No   Drug use: No   Sexual activity: Not Currently  Other Topics Concern   Not on file  Social History Narrative   Not on file   Social Determinants of Health   Financial Resource Strain: Not on file  Food Insecurity: Not on file  Transportation Needs: Not on file  Physical Activity: Not on file  Stress: Not on file  Social Connections: Not on file     Family History:  The patient's  family history includes Hypertension in an other family member.   ROS:   Please see the history of present illness.    ROS All other systems reviewed and are negative.   PHYSICAL EXAM:   VS:  BP 130/70 (BP Location: Left Arm, Patient Position: Sitting, Cuff Size: Normal)    Pulse 88    Ht 5\' 8"  (1.727 m)    Wt 211 lb (95.7 kg)    SpO2 97%    BMI 32.08 kg/m     Wt Readings from Last 3 Encounters:  06/28/21 211 lb (95.7 kg)  06/11/21 226 lb (102.5 kg)  06/12/20 227 lb 6.4 oz (103.1 kg)     Affect appropriate Healthy:  appears stated age HEENT: normal Neck supple with no adenopathy JVP normal no bruits no thyromegaly Lungs clear with no wheezing and good diaphragmatic motion Heart:  S1/S2 no murmur, no rub, gallop or click PMI normal Abdomen: benighn, BS positve, no tenderness, no AAA no bruit.  No HSM or HJR Distal pulses intact with no bruits Trace LE edema improved  Neuro non-focal Skin  warm and dry No muscular weakness   Studies/Labs Reviewed:   EKG:  EKG is  ordered today.  The ekg ordered today demonstrates NSR with LVH and repolarization changes no acute change.  Recent Labs: 06/11/2021: B Natriuretic Peptide 1,595.0; BUN 19; Creatinine, Ser 1.48; Potassium 3.8; Sodium 140   Lipid Panel    Component Value Date/Time   CHOL 181 06/06/2020 1054   TRIG 125 06/06/2020 1054   HDL 38 (L) 06/06/2020 1054   CHOLHDL 4.8 06/06/2020 1054   VLDL 25 06/06/2020 1054   LDLCALC 118 (H) 06/06/2020 1054    Additional studies/ records that were reviewed today include:  Echocardiogram 10/10/2019:  1. Left ventricular ejection fraction, by estimation, is 45 to 50%. The  left ventricle has mildly decreased function. The left ventricle  demonstrates global hypokinesis. There is mild concentric left ventricular  hypertrophy. Left ventricular diastolic  parameters are consistent with Grade I diastolic dysfunction (impaired  relaxation).   2. Right ventricular systolic function is normal. The right ventricular  size is normal. There is normal pulmonary artery systolic pressure.   3. The mitral valve is normal in structure. Trivial mitral valve  regurgitation. No evidence of mitral stenosis.   4. The aortic valve is normal in structure. Aortic valve regurgitation is  not visualized. No aortic stenosis is present.   5. Aortic dilatation noted. There is mild dilatation at the level of the  sinuses of Valsalva measuring 37 mm.   6. The inferior vena cava is normal in size  with <50% respiratory  variability, suggesting right atrial pressure of 8 mmHg.      Risk Assessment/Calculations:         ASSESSMENT:    1. Essential hypertension      PLAN:  In order of problems listed above:  CAD S/P BMS to RCA and LCx 10/09, DES to D1 and RCA 04/2014  CABG with LIMA to LAD and SVG to diagonal April 2021 on Plavix lipitor Zetia,no angina  ICM LVEF 45-50% recent CHF not on appropriate GDMT. Improved to baseline with lasix/K Start coreg and low dose ARB. BMET/BNP in 2 weeks At that time can consider aldactone pending results of BMET D/c Norvasc   HTN improved on diuretic see above   CKD 3b creatinine 1.48 follow with med changes I don't think renal function bad enough to avoid ACE/ARB given ischemic DCM and recent CHF   Hyperlipidemia on atorvastatin and Zetia.  LDL 61 11/2020  Time spent reviewing chart , cath , echo CABG report labs and recent visit with PA 50 minutes    Coreg 3.125 bid Cozaar 25 mg D/c norvasc  BMET /BNP 2 weeks F/U Dr Domenic Polite    Signed, Jenkins Rouge, MD  06/28/2021 1:22 PM    [facility] [address] [place], [place], [place]  [address] Phone: [phone]; Fax: [phone]

## 2021-07-08 ENCOUNTER — Other Ambulatory Visit (HOSPITAL_COMMUNITY)
Admission: RE | Admit: 2021-07-08 | Discharge: 2021-07-08 | Disposition: A | Discharge status: Home / Self Care | Payer: Medicare HMO | Source: Ambulatory Visit | Attending: Cardiovascular Disease | Admitting: Cardiovascular Disease

## 2021-07-08 ENCOUNTER — Telehealth: Payer: Self-pay | Admitting: Cardiology

## 2021-07-08 DIAGNOSIS — I110 Hypertensive heart disease with heart failure: Secondary | ICD-10-CM | POA: Insufficient documentation

## 2021-07-08 DIAGNOSIS — I10 Essential (primary) hypertension: Secondary | ICD-10-CM

## 2021-07-08 DIAGNOSIS — I5023 Acute on chronic systolic (congestive) heart failure: Secondary | ICD-10-CM | POA: Diagnosis not present

## 2021-07-08 LAB — BASIC METABOLIC PANEL
Anion gap: 8 (ref 5–15)
BUN: 19 mg/dL (ref 8–23)
CO2: 26 mmol/L (ref 22–32)
Calcium: 9.4 mg/dL (ref 8.9–10.3)
Chloride: 104 mmol/L (ref 98–111)
Creatinine, Ser: 1.55 mg/dL — ABNORMAL HIGH (ref 0.61–1.24)
GFR, Estimated: 44 mL/min — ABNORMAL LOW (ref >60)
Glucose, Bld: 100 mg/dL — ABNORMAL HIGH (ref 70–99)
Potassium: 4.2 mmol/L (ref 3.5–5.1)
Sodium: 138 mmol/L (ref 135–145)

## 2021-07-08 LAB — BRAIN NATRIURETIC PEPTIDE: B Natriuretic Peptide: 467 pg/mL — ABNORMAL HIGH (ref 0.0–100.0)

## 2021-07-08 NOTE — Telephone Encounter (Signed)
Patient is returning call to discuss lab results. 

## 2021-07-09 NOTE — Telephone Encounter (Signed)
Pt notified of test results

## 2021-07-18 ENCOUNTER — Ambulatory Visit (HOSPITAL_COMMUNITY)
Admission: RE | Admit: 2021-07-18 | Discharge: 2021-07-18 | Disposition: A | Discharge status: Home / Self Care | Payer: Medicare HMO | Source: Ambulatory Visit | Attending: Physician Assistant | Admitting: Physician Assistant

## 2021-07-18 ENCOUNTER — Other Ambulatory Visit: Payer: Self-pay

## 2021-07-18 DIAGNOSIS — I5023 Acute on chronic systolic (congestive) heart failure: Secondary | ICD-10-CM

## 2021-07-18 DIAGNOSIS — R0602 Shortness of breath: Secondary | ICD-10-CM

## 2021-07-18 LAB — ECHOCARDIOGRAM COMPLETE
AR max vel: 2.11 cm2
AV Area VTI: 2.01 cm2
AV Area mean vel: 1.99 cm2
AV Mean grad: 3.5 mmHg
AV Peak grad: 6.7 mmHg
Ao pk vel: 1.3 m/s
Area-P 1/2: 6.71 cm2
MV M vel: 5 m/s
MV Peak grad: 100 mmHg
Radius: 0.6 cm
S' Lateral: 5 cm

## 2021-07-18 NOTE — Progress Notes (Signed)
*  PRELIMINARY RESULTS* Echocardiogram 2D Echocardiogram has been performed.  Corey [name] 07/18/2021, 1:43 PM

## 2021-08-03 ENCOUNTER — Other Ambulatory Visit: Payer: Self-pay | Admitting: Cardiology

## 2021-08-06 ENCOUNTER — Encounter: Payer: Self-pay | Admitting: Cardiology

## 2021-08-06 ENCOUNTER — Ambulatory Visit: Payer: Medicare HMO | Admitting: Cardiology

## 2021-08-06 ENCOUNTER — Other Ambulatory Visit: Payer: Self-pay

## 2021-08-06 VITALS — BP 150/80 | HR 80 | Ht 68.0 in | Wt 216.2 lb

## 2021-08-06 DIAGNOSIS — Z79899 Other long term (current) drug therapy: Secondary | ICD-10-CM

## 2021-08-06 DIAGNOSIS — N1832 Chronic kidney disease, stage 3b: Secondary | ICD-10-CM

## 2021-08-06 DIAGNOSIS — I25119 Atherosclerotic heart disease of native coronary artery with unspecified angina pectoris: Secondary | ICD-10-CM | POA: Diagnosis not present

## 2021-08-06 DIAGNOSIS — I5020 Unspecified systolic (congestive) heart failure: Secondary | ICD-10-CM

## 2021-08-06 MED ORDER — ENTRESTO 49-51 MG PO TABS: 1.0000 | ORAL_TABLET | Freq: Two times a day (BID) | ORAL | 11 refills | Status: DC

## 2021-08-06 MED ORDER — CARVEDILOL 6.25 MG PO TABS: 6.2500 mg | ORAL_TABLET | Freq: Two times a day (BID) | ORAL | 3 refills | Status: DC

## 2021-08-06 NOTE — Progress Notes (Signed)
Cardiology Office Note  Date: 08/06/2021   ID: JOHNEDWARD BRODRICK, DOB 1935-12-04, MRN [id number]  PCP:  [facility]  Cardiologist:  Rozann Lesches, MD Electrophysiologist:  None   Chief Complaint  Patient presents with   Cardiac follow-up    History of Present Illness: CHRISTYAN REGER is an 86 y.o. male last seen in the office by Dr. Johnsie Cancel in December 2022 and follow-up with recently documented progressive cardiomyopathy and heart failure, I reviewed the note.  I last saw him in December 2021.  Recent echocardiogram revealed LVEF 20 to 25% range with restrictive diastolic filling pattern and mild RV dysfunction.  He improved symptomatically with diuresis initially, was started on Coreg and losartan by Dr. Johnsie Cancel with plan to stop Norvasc and then follow-up for further medication adjustments.  He presents today stating that he continues to do reasonably well.  He reports NYHA class II dyspnea.  His weight is up about 5 pounds from December.  As it turns out he did not stop Norvasc, although has been taking low-dose Coreg and losartan.  Blood pressure not in optimal range, but not nearly as high as previously.  I did review interval lab work, he has CKD stage IIIb, creatinine was 1.55 with potassium 4.2 after the initiation of losartan.  Today we discussed medication changes to better optimize treatment of his cardiomyopathy.  He does not report any angina at this point.  Past Medical History:  Diagnosis Date   Anaphylaxis    Aspirin   Coronary atherosclerosis of native coronary artery    a. BMS to RCA and LCx 10/09, DES to D1 and RCA 04/2014  b.  CABG with LIMA to LAD and SVG to diagonal April 2021   Essential hypertension    Hyperlipidemia    Myocardial infarction [facility]) 1998   NSTEMI (non-ST elevated myocardial infarction) ([place]) 03/2008   Seasonal allergies     Past Surgical History:  Procedure Laterality Date   CARDIAC CATHETERIZATION     Cath  05/18/2014 DES to D1 and mid RCA   COLONOSCOPY  05/01/2011   Procedure: COLONOSCOPY;  Surgeon: Daneil Dolin, MD;  [facility];  Service: Endoscopy;  Laterality: N/A;  11:30   CORONARY ANGIOPLASTY WITH STENT PLACEMENT  07/1996; 03/2008; 05/18/2014   "?3; ?2; 2"   CORONARY ARTERY BYPASS GRAFT N/A 10/14/2019   Procedure: CORONARY ARTERY BYPASS GRAFTING (CABG) x 2;  Surgeon: Gaye Pollack, MD;  Location: [place];  Service: Open Heart Surgery;  Laterality: N/A;   ENDOVEIN HARVEST OF GREATER SAPHENOUS VEIN Right 10/14/2019   Procedure: [place] Of Greater Saphenous Vein;  Surgeon: Gaye Pollack, MD;  Location: [facility] OR;  Service: Open Heart Surgery;  Laterality: Right;   INTRAVASCULAR PRESSURE WIRE/FFR STUDY N/A 10/10/2019   Procedure: INTRAVASCULAR PRESSURE WIRE/FFR STUDY;  Surgeon: Nelva Bush, MD;  Location: [place] CV LAB;  Service: Cardiovascular;  Laterality: N/A;   LEFT HEART CATH AND CORONARY ANGIOGRAPHY N/A 10/10/2019   Procedure: LEFT HEART CATH AND CORONARY ANGIOGRAPHY;  Surgeon: Nelva Bush, MD;  Location: [place] CV LAB;  Service: Cardiovascular;  Laterality: N/A;   LEFT HEART CATHETERIZATION WITH CORONARY ANGIOGRAM N/A 05/18/2014   Procedure: LEFT HEART CATHETERIZATION WITH CORONARY ANGIOGRAM;  Surgeon: Blane Ohara, MD;  Location: [facility] CATH LAB;  Service: Cardiovascular;  Laterality: N/A;   TEE WITHOUT CARDIOVERSION N/A 10/14/2019   Procedure: TRANSESOPHAGEAL ECHOCARDIOGRAM (TEE);  Surgeon: Gaye Pollack, MD;  Location: [place];  Service: Open Heart Surgery;  Laterality: N/A;    Current Outpatient Medications  Medication Sig Dispense Refill   Ascorbic Acid (VITAMIN C) 1000 MG tablet Take 1,000 mg by mouth daily.     atorvastatin (LIPITOR) 80 MG tablet Take 1 tablet daily at 6 pm. 90 tablet 3   carvedilol (COREG) 6.25 MG tablet Take 1 tablet (6.25 mg total) by mouth 2 (two) times daily. 180 tablet 3   clopidogrel (PLAVIX) 75 MG tablet TAKE 1 TABLET BY  MOUTH ONCE DAILY - NEED FOLLOW UP FOR REFILLS 90 tablet 3   ezetimibe (ZETIA) 10 MG tablet Take 1 tablet by mouth once daily 90 tablet 0   furosemide (LASIX) 40 MG tablet Take 1 tablet (40 mg total) by mouth daily. 90 tablet 2   Omega-3 Fatty Acids (FISH OIL) 1000 MG CAPS Take 1,000 mg by mouth 2 (two) times daily.     potassium chloride SA (KLOR-CON M) 20 MEQ tablet Take 1 tablet (20 mEq total) by mouth daily. 90 tablet 2   sacubitril-valsartan (ENTRESTO) 49-51 MG Take 1 tablet by mouth 2 (two) times daily. 60 tablet 11   traMADol (ULTRAM) 50 MG tablet Take 1 tablet (50 mg total) by mouth every 6 (six) hours as needed for moderate pain. 28 tablet 0   vitamin E 1000 UNIT capsule Take 1 capsule (1,000 Units total) by mouth daily.     No current facility-administered medications for this visit.   Allergies:  Aspirin, Other, and Penicillins   ROS: No palpitations or syncope.  Physical Exam: VS:  BP (!) 150/80    Pulse 80    Ht 5\' 8"  (1.727 m)    Wt 216 lb 3.2 oz (98.1 kg)    SpO2 98%    BMI 32.87 kg/m , BMI Body mass index is 32.87 kg/m.  Wt Readings from Last 3 Encounters:  08/06/21 216 lb 3.2 oz (98.1 kg)  06/28/21 211 lb (95.7 kg)  06/11/21 226 lb (102.5 kg)    General: Patient appears comfortable at rest. HEENT: Conjunctiva and lids normal, wearing a mask. Neck: Supple, no elevated JVP or carotid bruits, no thyromegaly. Lungs: Clear to auscultation, nonlabored breathing at rest. Cardiac: Regular rate and rhythm, no S3, 2/6 systolic murmur. Extremities: Mild lower leg edema.  ECG:  An ECG dated 06/11/2021 was personally reviewed today and demonstrated:  Sinus rhythm with LVH and IVCD.  Recent Labwork: 07/08/2021: B Natriuretic Peptide 467.0; BUN 19; Creatinine, Ser 1.55; Potassium 4.2; Sodium 138     Component Value Date/Time   CHOL 181 06/06/2020 1054   TRIG 125 06/06/2020 1054   HDL 38 (L) 06/06/2020 1054   CHOLHDL 4.8 06/06/2020 1054   VLDL 25 06/06/2020 1054   LDLCALC 118  (H) 06/06/2020 1054    Other Studies Reviewed Today:  Echocardiogram 07/18/2021:  1. Left ventricular ejection fraction, by estimation, is 20 to 25%. The  left ventricle has severely decreased function. The left ventricle  demonstrates global hypokinesis. There is moderate left ventricular  hypertrophy. Left ventricular diastolic  parameters are consistent with Grade III diastolic dysfunction  (restrictive). Elevated left atrial pressure.   2. Right ventricular systolic function is mildly reduced. The right  ventricular size is normal.   3. Left atrial size was severely dilated.   4. The mitral valve is abnormal. Mild to moderate mitral valve  regurgitation. No evidence of mitral stenosis. Moderate mitral annular  calcification.   5. The aortic valve has an indeterminant number of cusps. Aortic  valve  regurgitation is not visualized. No aortic stenosis is present.   6. The inferior vena cava is normal in size with greater than 50%  respiratory variability, suggesting right atrial pressure of 3 mmHg.   Assessment and Plan:  1.  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  He has a history of ischemic heart disease status post CABG in 2021.  Recent follow-up echocardiogram in January revealed LVEF 20 to 25% with global hypokinesis, restrictive diastolic filling pattern, also mild RV dysfunction with mild to moderate mitral regurgitation.  He reports NYHA class II dyspnea at this time, fluid status improved but not optimized.  Plan is to stop Norvasc, this was clarified with him today.  Change Cozaar to Entresto 49/51 mg twice daily, increase Coreg to 6.25 mg twice daily.  Would not Aldactone or SGLT2 inhibitor as yet until follow-up BMET.  Continue Lasix with potassium supplement.  Office follow-up arranged for further assessment within the next month.  2.  Multivessel CAD status post CABG in 2021.  He does not report any active angina at this time.  Can consider further ischemic work-up in  light of his interval cardiomyopathy depending on how he does with medical therapy adjustments.  Continue Plavix and Lipitor.  3.  CKD stage IIIb, creatinine 1.55 with normal potassium.  Medication Adjustments/Labs and Tests Ordered: Current medicines are reviewed at length with the patient today.  Concerns regarding medicines are outlined above.   Tests Ordered: Orders Placed This Encounter  Procedures   Basic metabolic panel    Medication Changes: Meds ordered this encounter  Medications   sacubitril-valsartan (ENTRESTO) 49-51 MG    Sig: Take 1 tablet by mouth 2 (two) times daily.    Dispense:  60 tablet    Refill:  11    Please Honor Card patient is presenting for Carmie Kanner: [id number]; Juanna Cao: [id number]; VUYEB: [id number]; XIDH: [id number]   carvedilol (COREG) 6.25 MG tablet    Sig: Take 1 tablet (6.25 mg total) by mouth 2 (two) times daily.    Dispense:  180 tablet    Refill:  3    08/06/21 dose increased to 6.25 mg bid    Disposition:  Follow up  3 weeks.  Signed, Satira Sark, MD, [facility] 08/06/2021 4:46 PM    [place] [facility] [address] [place], [place] [address] Phone: [phone]; Fax: [phone]

## 2021-08-06 NOTE — Patient Instructions (Signed)
Medication Instructions:  STOP Amlodipine   STOP Losartan   INCREASE Coreg to 6.25 mg twice a day   START Entresto 49/51 mg TWICE a day  Labwork: BMET at next office visit  Testing/Procedures: None today  Follow-Up: 3 weeks  Any Other Special Instructions Will Be Listed Below (If Applicable).  If you need a refill on your cardiac medications before your next appointment, please call your pharmacy.

## 2021-08-29 ENCOUNTER — Other Ambulatory Visit (HOSPITAL_COMMUNITY)
Admission: RE | Admit: 2021-08-29 | Discharge: 2021-08-29 | Disposition: A | Discharge status: Home / Self Care | Payer: Medicare HMO | Source: Ambulatory Visit | Attending: Cardiology | Admitting: Cardiology

## 2021-08-29 ENCOUNTER — Other Ambulatory Visit: Payer: Self-pay

## 2021-08-29 DIAGNOSIS — Z79899 Other long term (current) drug therapy: Secondary | ICD-10-CM | POA: Diagnosis present

## 2021-08-29 LAB — BASIC METABOLIC PANEL
Anion gap: 7 (ref 5–15)
BUN: 25 mg/dL — ABNORMAL HIGH (ref 8–23)
CO2: 28 mmol/L (ref 22–32)
Calcium: 9.1 mg/dL (ref 8.9–10.3)
Chloride: 105 mmol/L (ref 98–111)
Creatinine, Ser: 1.85 mg/dL — ABNORMAL HIGH (ref 0.61–1.24)
GFR, Estimated: 36 mL/min — ABNORMAL LOW (ref >60)
Glucose, Bld: 98 mg/dL (ref 70–99)
Potassium: 4.6 mmol/L (ref 3.5–5.1)
Sodium: 140 mmol/L (ref 135–145)

## 2021-09-05 NOTE — Progress Notes (Signed)
Cardiology Office Note    Date:  09/06/2021   ID:  Corey [name], DOB June 11, 1936, MRN [id number]  PCP:  [facility]  Cardiologist: Rozann Lesches, MD    Chief Complaint  Patient presents with   Follow-up    1 month visit    History of Present Illness:    Corey [name] is a 86 y.o. male with past medical history of CAD (s/p BMS to RCA and LCx in 2009, DES to D1 and RCA in 2015, CABG in 09/2019 with LIMA-LAD and SVG-D1), HFrEF (EF 45-50% in 09/2019, at 20-25% by echo in 06/2021 but had been off his antihypertensives in the interim), HTN, HLD and Stage 3 CKD who presents to the office today for 16-monthfollow-up.   He was last examined by Dr. MDomenic Politein 07/2021 and his recent echocardiogram had shown his EF was reduced at 20 to 25% with mild RV dysfunction. Given his cardiomyopathy, Amlodipine was discontinued and Losartan was transitioned to Entresto 49-51 mg twice daily and Coreg was increased to 6.25 mg twice daily. He was not started on Aldactone or an SGLT2 inhibitor until follow-up labs were obtained. BMET on 08/29/2021 showed his potassium was stable at 4.6 with Na+ at 140 but his creatinine had trended up from 1.55 to 1.85 and was recommended to have a BMET prior to his next visit to see if medical therapy could be further adjusted (not yet obtained).  In talking with the patient today, he reports much improvement in his dyspnea since 05/2021. [facility] ways to gradually increase his activity level. He denies any recent orthopnea or PND. Has chronic lower extremity edema but he reports this has improved since being on Entresto. No reported chest pain or palpitations. He was confused about the dose adjustment of Coreg at his last visit and has been taking BOTH 3.'125mg'$  BID and 6.'25mg'$  BID.   Past Medical History:  Diagnosis Date   Anaphylaxis    Aspirin   CHF (congestive heart failure) ([facility]    a. EF 45-50%  in 09/2019 b. EF 20-25% in 06/2021 (had been off medications in the interim)   Coronary atherosclerosis of native coronary artery    a. s/p BMS to RCA and LCx in 2009 b. DES to D1 and RCA in 2015 c. CABG in 09/2019 with LIMA-LAD and SVG-D1   Essential hypertension    Hyperlipidemia    [facility]ocardial [facility] 1998   [place] (non-ST elevated myocardial infarction) [place]   Seasonal allergies     Past Surgical History:  Procedure Laterality Date   CARDIAC CATHETERIZATION     Cath 05/18/2014 DES to D1 and mid RCA   COLONOSCOPY  05/01/2011   Procedure: COLONOSCOPY;  Surgeon: RDaneil Dolin MD;  [facility];  Service: Endoscopy;  Laterality: N/A;  11:30   CORONARY ANGIOPLASTY WITH STENT PLACEMENT  07/1996; 03/2008; 05/18/2014   "?3; ?2; 2"   CORONARY ARTERY BYPASS GRAFT N/A 10/14/2019   Procedure: CORONARY ARTERY BYPASS GRAFTING (CABG) x 2;  Surgeon: BGaye Pollack MD;  [place] [facility]  Service: Open Heart Surgery;  Laterality: N/A;   ENDOVEIN HARVEST OF GREATER SAPHENOUS VEIN Right 10/14/2019   Procedure: ECharleston RopesOf Greater Saphenous Vein;  Surgeon: BGaye Pollack MD;  [facility];  Service: Open Heart Surgery;  Laterality: Right;   INTRAVASCULAR PRESSURE WIRE/FFR STUDY N/A 10/10/2019   Procedure: INTRAVASCULAR PRESSURE WIRE/FFR STUDY;  Surgeon: Nelva Bush, MD;  Location: [place] CV LAB;  Service: Cardiovascular;  Laterality: N/A;   LEFT HEART CATH AND CORONARY ANGIOGRAPHY N/A 10/10/2019   Procedure: LEFT HEART CATH AND CORONARY ANGIOGRAPHY;  Surgeon: Nelva Bush, MD;  Location: [place] CV LAB;  Service: Cardiovascular;  Laterality: N/A;   LEFT HEART CATHETERIZATION WITH CORONARY ANGIOGRAM N/A 05/18/2014   Procedure: LEFT HEART CATHETERIZATION WITH CORONARY ANGIOGRAM;  Surgeon: Blane Ohara, MD;  Location: [facility] CATH LAB;  Service: Cardiovascular;  Laterality: N/A;   TEE WITHOUT CARDIOVERSION N/A 10/14/2019   Procedure: TRANSESOPHAGEAL  ECHOCARDIOGRAM (TEE);  Surgeon: Gaye Pollack, MD;  [place];  Service: Open Heart Surgery;  Laterality: N/A;    Current Medications: Outpatient Medications Prior to Visit  Medication Sig Dispense Refill   Ascorbic Acid (VITAMIN C) 1000 MG tablet Take 1,000 mg by mouth daily.     atorvastatin (LIPITOR) 80 MG tablet Take 1 tablet daily at 6 pm. 90 tablet 3   carvedilol (COREG) 6.25 MG tablet Take 1 tablet (6.25 mg total) by mouth 2 (two) times daily. 180 tablet 3   clopidogrel (PLAVIX) 75 MG tablet TAKE 1 TABLET BY MOUTH ONCE DAILY - NEED FOLLOW UP FOR REFILLS 90 tablet 3   ezetimibe (ZETIA) 10 MG tablet Take 1 tablet by mouth once daily 90 tablet 0   Omega-3 Fatty Acids (FISH OIL) 1000 MG CAPS Take 1,000 mg by mouth 2 (two) times daily.     potassium chloride SA (KLOR-CON M) 20 MEQ tablet Take 1 tablet (20 mEq total) by mouth daily. 90 tablet 2   sacubitril-valsartan (ENTRESTO) 49-51 MG Take 1 tablet by mouth 2 (two) times daily. 60 tablet 11   vitamin E 1000 UNIT capsule Take 1 capsule (1,000 Units total) by mouth daily.     furosemide (LASIX) 40 MG tablet Take 1 tablet (40 mg total) by mouth daily. 90 tablet 2   traMADol (ULTRAM) 50 MG tablet Take 1 tablet (50 mg total) by mouth every 6 (six) hours as needed for moderate pain. (Patient not taking: Reported on 09/06/2021) 28 tablet 0   No facility-administered medications prior to visit.     Allergies:   Aspirin, Other, and Penicillins   Social History   Socioeconomic History   Marital status: Widowed    Spouse name: Not on file   Number of children: Not on file   Years of education: Not on file   Highest education level: Not on file  Occupational History   Occupation: Retired    Comment: Quarry manager  Tobacco Use   Smoking status: Former    Packs/day: 2.00    Years: 45.00    Pack years: 90.00    Types: Cigarettes    Quit date: 07/31/1996    Years since quitting: 25.1   Smokeless tobacco: Never  Vaping Use    Vaping Use: Never used  Substance and Sexual Activity   Alcohol use: No   Drug use: No   Sexual activity: Not Currently  Other Topics Concern   Not on file  Social History Narrative   Not on file   Social Determinants of Health   Financial Resource Strain: Not on file  Food Insecurity: Not on file  Transportation Needs: Not on file  Physical Activity: Not on file  Stress: Not on file  Social Connections: Not on file     Family History:  The patient's family history includes Hypertension in an other family member.   Review of  Systems:    Please see the history of present illness.     All other systems reviewed and are otherwise negative except as noted above.   Physical Exam:    VS:  BP 122/60    Pulse 72    Ht '5\' 8"'$  (1.727 m)    Wt 218 lb 9.6 oz (99.2 kg)    SpO2 98%    BMI 33.24 kg/m    General: Pleasant overweight male appearing in no acute distress. Head: Normocephalic, atraumatic. Neck: No carotid bruits. JVD not elevated.  Lungs: Respirations regular and unlabored, without wheezes or rales.  Heart: Regular rate and rhythm. No S3 or S4.  No murmur, no rubs, or gallops appreciated. Abdomen: Appears non-distended. No obvious abdominal masses. Msk:  Strength and tone appear normal for age. No obvious joint deformities or effusions. Extremities: No clubbing or cyanosis. Trace lower extremity edema.  Distal pedal pulses are 2+ bilaterally. Neuro: Alert and oriented X 3. Moves all extremities spontaneously. No focal deficits noted. Psych:  Responds to questions appropriately with a normal affect. Skin: No rashes or lesions noted  Wt Readings from Last 3 Encounters:  09/06/21 218 lb 9.6 oz (99.2 kg)  08/06/21 216 lb 3.2 oz (98.1 kg)  06/28/21 211 lb (95.7 kg)     Studies/Labs Reviewed:   EKG:  EKG is not ordered today.   Recent Labs: 07/08/2021: B Natriuretic Peptide 467.0 08/29/2021: BUN 25; Creatinine, Ser 1.85; Potassium 4.6; Sodium 140   Lipid Panel     Component Value Date/Time   CHOL 181 06/06/2020 1054   TRIG 125 06/06/2020 1054   HDL 38 (L) 06/06/2020 1054   CHOLHDL 4.8 06/06/2020 1054   VLDL 25 06/06/2020 1054   [place] 118 (H) 06/06/2020 1054    Additional studies/ records that were reviewed today include:   LHC: 09/2019 Conclusions: Severe single vessel coronary artery disease with 90% ostial/proximal LAD disease followed by 75% stenosis at the bifurcation with previously stented D1 ([place] 0,1,0). Mild to moderate, non-obstructive disease involving ramus intermedius and proximal RCA (DFR 0.94). Previously placed stents in D1, LCx, proximal RCA, and mid RCA are patent, with mild in-stent restenosis noted in the LCx and mid RCA. Mildly to moderately reduced left ventricular contraction (LVEF ~45%) with normal filling pressure.   Recommendations: Images were reviewed with Dr. Gwenlyn Found.  We have agreed that surgical revascularization may be preferential for treatment of complex LAD disease.  PCI to ostial and proximal LAD is feasible but could jeopardize ramus intermedius/LCx and D1. Discontinue clopidogrel pending cardiac surgery consultation.  Heparin infusion to be restarted 2 hours after TR band deflation. Aggressive secondary prevention of coronary artery disease. Follow-up echocardiogram interpretation.  Echo: 06/2021 IMPRESSIONS     1. Left ventricular ejection fraction, by estimation, is 20 to 25%. The  left ventricle has severely decreased function. The left ventricle  demonstrates global hypokinesis. There is moderate left ventricular  hypertrophy. Left ventricular diastolic  parameters are consistent with Grade III diastolic dysfunction  (restrictive). Elevated left atrial pressure.   2. Right ventricular systolic function is mildly reduced. The right  ventricular size is normal.   3. Left atrial size was severely dilated.   4. The mitral valve is abnormal. Mild to moderate mitral valve  regurgitation. No evidence  of mitral stenosis. Moderate mitral annular  calcification.   5. The aortic valve has an indeterminant number of cusps. Aortic valve  regurgitation is not visualized. No aortic stenosis is present.   6. The  inferior vena cava is normal in size with greater than 50%  respiratory variability, suggesting right atrial pressure of 3 mmHg.   Assessment:    1. Chronic combined systolic and diastolic CHF (congestive heart failure) ([place])   2. Coronary artery disease involving coronary bypass graft of native heart without angina pectoris   3. Essential hypertension   4. Hyperlipidemia LDL goal <70   5. Stage 3b chronic kidney disease ([place])      Plan:   In order of problems listed above:  1. HFrEF - Recent echo in 06/2021 showed his EF was reduced at 20-25% but he had been without his medications for months. He reports significant improvement in his dyspnea and his weight has declined by almost 10 lbs since 05/2021. - He is currently taking Coreg 9.'375mg'$  BID, Entresto 49-'51mg'$  BID and Lasix '40mg'$  daily. Given his rising creatinine, will reduce Lasix to '20mg'$  daily and plan for a follow-up BMET in 1 week. Pending reassessment of his renal function, would add Spironolactone or an SGLT-2 inhibitor if renal function allows. Although he was accidentally taking 9.'375mg'$  BID of Coreg, he has been tolerating this well and BP has improved. Therefore, will continue at his current dose. Will plan for a repeat limited echo in 2 months. If his EF remains reduced, may need to consider a Lexiscan Myoview for repeat ischemic evaluation.   2. CAD - He is s/p BMS to RCA and LCx in 2009, DES to D1 and RCA in 2015 and CABG in 09/2019 with LIMA-LAD and SVG-D1. He reports significant improvement in his respiratory status and denies any chest pain.  - Continue Plavix '75mg'$  daily, Coreg 9.'375mg'$  BID, Atorvastatin '80mg'$  daily and Zetia '10mg'$  daily. Previously had anaphylaxis with ASA.   3. HTN - His blood pressure is  well-controlled at 122/60 during today's visit. Continue current medication regimen for now but he may require additional adjustments as outlined above pending repeat labs.   4. HLD - We have requested labs from his PCP. He remains on Atorvastatin '80mg'$  daily and Zetia '10mg'$  daily with goal LDL less than 70 given known CAD. If no recent labs available for review, would obtain at his next visit.   5. Stage 3 CKD - His creatinine was at 1.4 - 1.6 in 2021 with values from 1.4 - 1.5 within the past few months but this had trended up to 1.85 by recent labs. Will reduce Lasix as outlined above. Repeat BMET in 1 week.    Medication Adjustments/Labs and Tests Ordered: Current medicines are reviewed at length with the patient today.  Concerns regarding medicines are outlined above.  Medication changes, Labs and Tests ordered today are listed in the Patient Instructions below. Patient Instructions  Medication Instructions:   Take Coreg 3.125 mg and Coreg 6.25 mg Two Times Daily  Decrease Lasix to 20 mg Daily   *If you need a refill on your cardiac medications before your next appointment, please call your pharmacy*   Lab Work: Your physician recommends that you return for lab work in: 1 Week ( 09/13/21)   If you have labs (blood work) drawn today and your tests are completely normal, you will receive your results only by: [place] (if you have MyChart) OR A paper copy in the mail If you have any lab test that is abnormal or we need to change your treatment, we will call you to review the results.   Testing/Procedures: Your physician has requested that you have an echocardiogram. Echocardiography is  a painless test that uses sound waves to create images of your heart. It provides your doctor with information about the size and shape of your heart and how well your hearts chambers and valves are working. This procedure takes approximately one hour. There are no restrictions for this  procedure.    Follow-Up: At [facility], you and your health needs are our priority.  As part of our continuing mission to provide you with exceptional heart care, we have created designated Provider Care Teams.  These Care Teams include your primary Cardiologist (physician) and Advanced Practice Providers (APPs -  Physician Assistants and Nurse Practitioners) who all work together to provide you with the care you need, when you need it.  We recommend signing up for the patient portal called "MyChart".  Sign up information is provided on this After Visit Summary.  MyChart is used to connect with patients for Virtual Visits (Telemedicine).  Patients are able to view lab/test results, encounter notes, upcoming appointments, etc.  Non-urgent messages can be sent to your provider as well.   To learn more about what you can do with MyChart, go to [email or web address].    Your next appointment:   2 month(s)  The format for your next appointment:   In Person  Provider:   Rozann Lesches, MD, Bernerd Pho, PA-C, or Ermalinda Barrios, PA-C    Other Instructions Thank you for choosing [facility]!      Signed, Erma Heritage, PA-C  09/06/2021 9:04 PM    [place] [address] [place], [place] [address] Phone: [phone] Fax: [phone]

## 2021-09-06 ENCOUNTER — Ambulatory Visit (INDEPENDENT_AMBULATORY_CARE_PROVIDER_SITE_OTHER): Payer: Medicare HMO | Admitting: Student

## 2021-09-06 ENCOUNTER — Encounter: Payer: Self-pay | Admitting: Student

## 2021-09-06 ENCOUNTER — Other Ambulatory Visit: Payer: Self-pay

## 2021-09-06 VITALS — BP 122/60 | HR 72 | Ht 68.0 in | Wt 218.6 lb

## 2021-09-06 DIAGNOSIS — I10 Essential (primary) hypertension: Secondary | ICD-10-CM

## 2021-09-06 DIAGNOSIS — E785 Hyperlipidemia, unspecified: Secondary | ICD-10-CM

## 2021-09-06 DIAGNOSIS — I5042 Chronic combined systolic (congestive) and diastolic (congestive) heart failure: Secondary | ICD-10-CM

## 2021-09-06 DIAGNOSIS — I25810 Atherosclerosis of coronary artery bypass graft(s) without angina pectoris: Secondary | ICD-10-CM | POA: Diagnosis not present

## 2021-09-06 DIAGNOSIS — N1832 Chronic kidney disease, stage 3b: Secondary | ICD-10-CM

## 2021-09-06 MED ORDER — FUROSEMIDE 20 MG PO TABS: 20.0000 mg | ORAL_TABLET | Freq: Every day | ORAL | 11 refills | Status: DC

## 2021-09-06 MED ORDER — CARVEDILOL 3.125 MG PO TABS: 3.1250 mg | ORAL_TABLET | Freq: Two times a day (BID) | ORAL | 3 refills | Status: DC

## 2021-09-06 NOTE — Patient Instructions (Signed)
Medication Instructions:  ? ?Take Coreg 3.125 mg and Coreg 6.25 mg Two Times Daily  ?Decrease Lasix to 20 mg Daily  ? ?*If you need a refill on your cardiac medications before your next appointment, please call your pharmacy* ? ? ?Lab Work: ?Your physician recommends that you return for lab work in: 1 Week ( 09/13/21)  ? ?If you have labs (blood work) drawn today and your tests are completely normal, you will receive your results only by: ?MyChart Message (if you have MyChart) OR ?A paper copy in the mail ?If you have any lab test that is abnormal or we need to change your treatment, we will call you to review the results. ? ? ?Testing/Procedures: ?Your physician has requested that you have an echocardiogram. Echocardiography is a painless test that uses sound waves to create images of your heart. It provides your doctor with information about the size and shape of your heart and how well your heart?s chambers and valves are working. This procedure takes approximately one hour. There are no restrictions for this procedure. ? ? ? ?Follow-Up: ?At [facility], you and your health needs are our priority.  As part of our continuing mission to provide you with exceptional heart care, we have created designated Provider Care Teams.  These Care Teams include your primary Cardiologist (physician) and Advanced Practice Providers (APPs -  Physician Assistants and Nurse Practitioners) who all work together to provide you with the care you need, when you need it. ? ?We recommend signing up for the patient portal called "MyChart".  Sign up information is provided on this After Visit Summary.  MyChart is used to connect with patients for Virtual Visits (Telemedicine).  Patients are able to view lab/test results, encounter notes, upcoming appointments, etc.  Non-urgent messages can be sent to your provider as well.   ?To learn more about what you can do with MyChart, go to [email or web address].   ? ?Your next appointment:   ?2  month(s) ? ?The format for your next appointment:   ?In Person ? ?Provider:   ?Rozann Lesches, MD, Bernerd Pho, PA-C, or Ermalinda Barrios, PA-C  ? ? ?Other Instructions ?Thank you for choosing [facility]! ? ?  ?

## 2021-09-12 ENCOUNTER — Other Ambulatory Visit (HOSPITAL_COMMUNITY)
Admission: RE | Admit: 2021-09-12 | Discharge: 2021-09-12 | Disposition: A | Discharge status: Home / Self Care | Payer: Medicare HMO | Source: Ambulatory Visit | Attending: Cardiology | Admitting: Cardiology

## 2021-09-12 DIAGNOSIS — Z79899 Other long term (current) drug therapy: Secondary | ICD-10-CM | POA: Insufficient documentation

## 2021-09-12 LAB — BASIC METABOLIC PANEL
Anion gap: 8 (ref 5–15)
BUN: 24 mg/dL — ABNORMAL HIGH (ref 8–23)
CO2: 24 mmol/L (ref 22–32)
Calcium: 8.5 mg/dL — ABNORMAL LOW (ref 8.9–10.3)
Chloride: 106 mmol/L (ref 98–111)
Creatinine, Ser: 1.85 mg/dL — ABNORMAL HIGH (ref 0.61–1.24)
GFR, Estimated: 36 mL/min — ABNORMAL LOW (ref >60)
Glucose, Bld: 128 mg/dL — ABNORMAL HIGH (ref 70–99)
Potassium: 4.2 mmol/L (ref 3.5–5.1)
Sodium: 138 mmol/L (ref 135–145)

## 2021-10-02 IMAGING — DX DG CHEST 1V PORT
1 series · 1 of 1 positions shown · non-contrast
Comparison: May 16, 2014

CLINICAL DATA: Chest pain.

EXAM:
PORTABLE CHEST 1 VIEW

[chest ap grid]
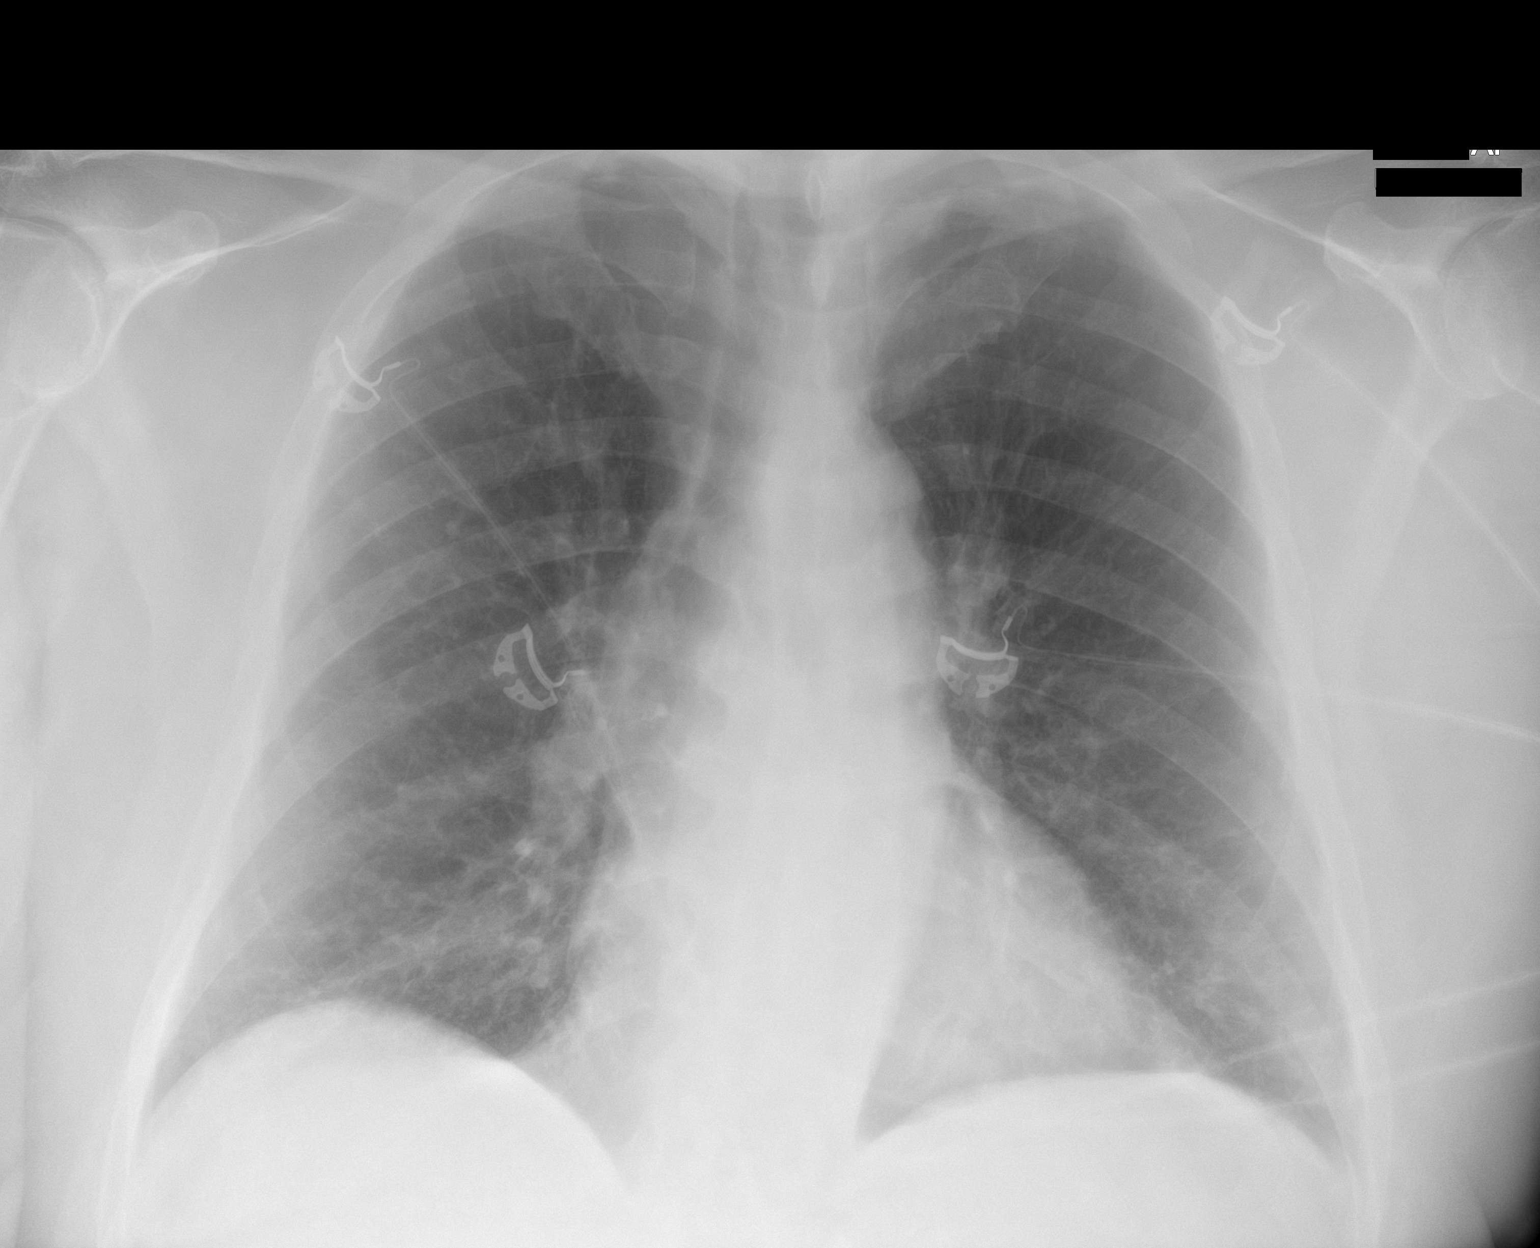

[1 of 1 positions shown; findings below may reference images not displayed]

FINDINGS: The heart size and mediastinal contours are within normal limits.
Both lungs are clear. Degenerative changes seen throughout the
thoracic spine.
IMPRESSION: No active disease.

## 2021-10-07 IMAGING — DX DG CHEST 1V PORT
1 series · 1 of 1 positions shown · non-contrast
Comparison: October 09, 2019

CLINICAL DATA: Hypoxia. Status post coronary artery bypass grafting

EXAM:
PORTABLE CHEST 1 VIEW

[chest ap]
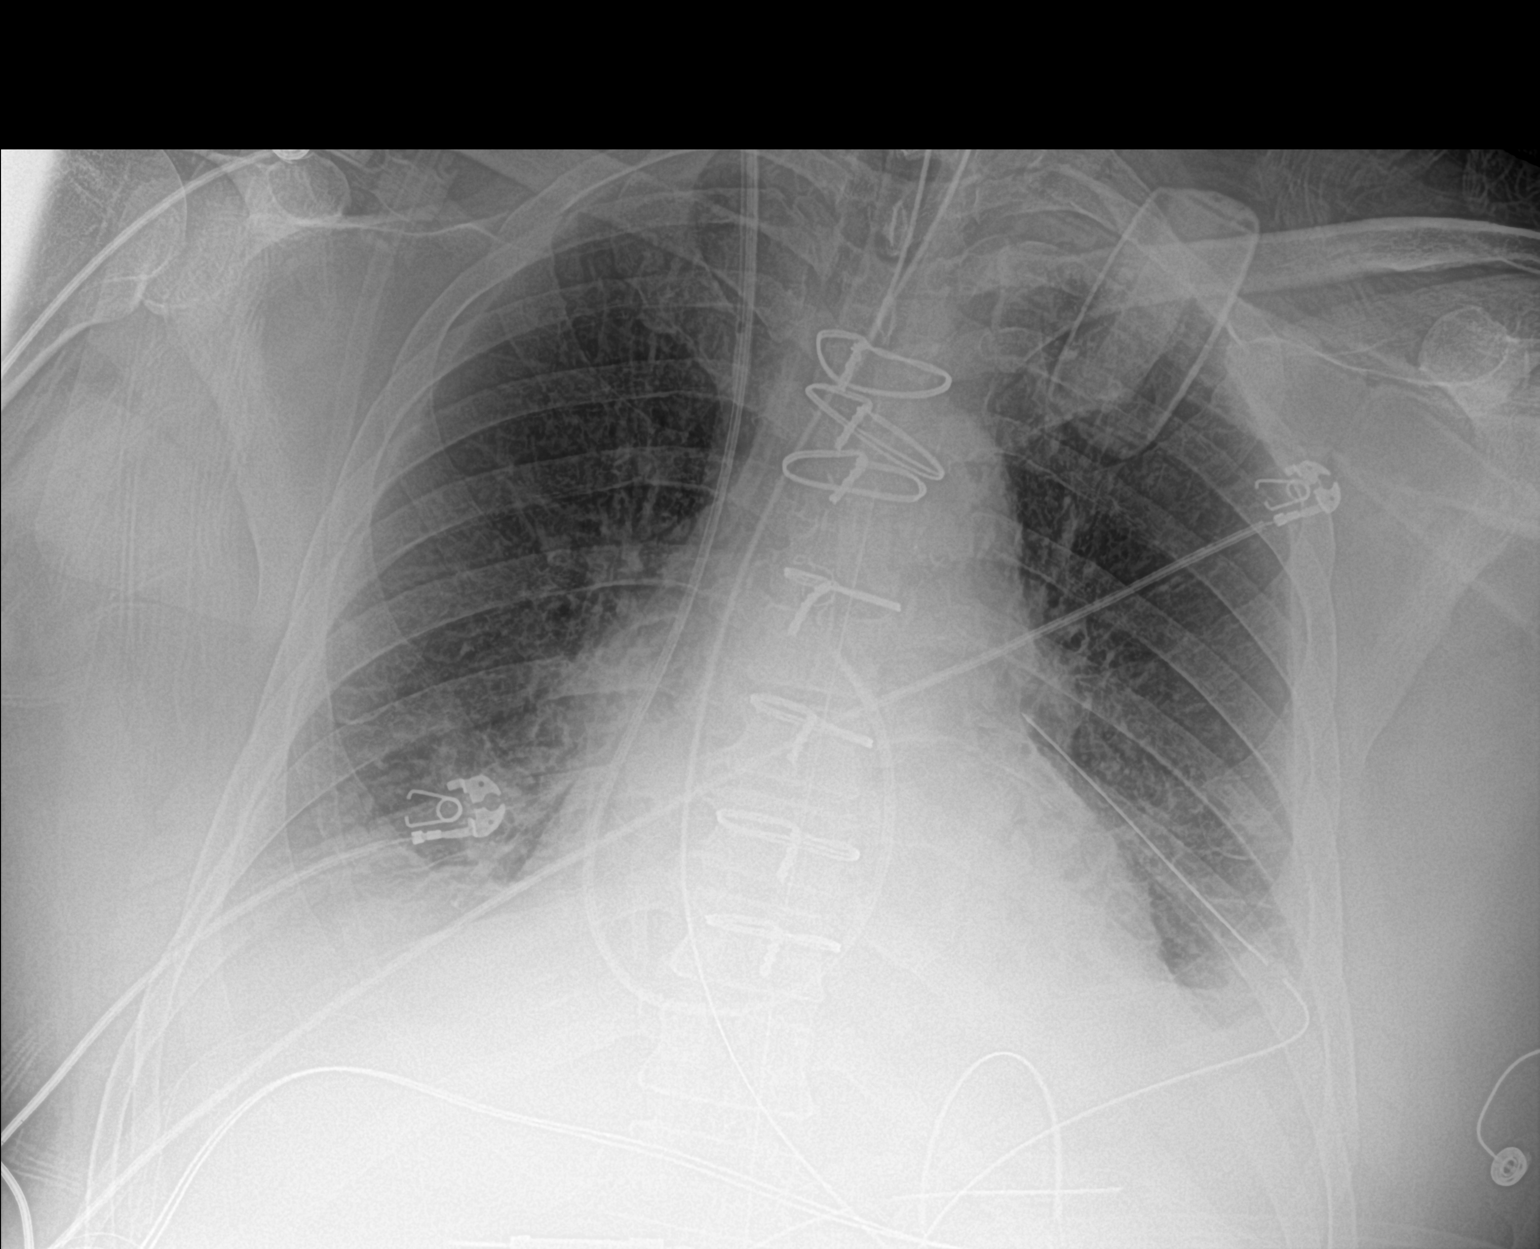

[1 of 1 positions shown; findings below may reference images not displayed]

FINDINGS: Endotracheal tube tip is 5.1 cm above the carina. Nasogastric tube
tip and side port are below the diaphragm. Swan-Ganz catheter tip is
in the main pulmonary outflow tract directed toward the right. There
is a left chest tube and a mediastinal drain. No pneumothorax. There
is a small left pleural effusion with left base atelectasis. Lungs
elsewhere are clear. Heart size and pulmonary vascularity are
normal. No adenopathy. Status post median sternotomy.
IMPRESSION: Tube and catheter positions as described without pneumothorax. Small
left pleural effusion with left base atelectasis. Lungs elsewhere
clear. Heart size within normal limits.

## 2021-11-04 ENCOUNTER — Ambulatory Visit: Payer: Medicare HMO | Admitting: Cardiology

## 2021-11-06 ENCOUNTER — Ambulatory Visit (HOSPITAL_COMMUNITY)
Admission: RE | Admit: 2021-11-06 | Discharge: 2021-11-06 | Disposition: A | Discharge status: Home / Self Care | Payer: Medicare HMO | Source: Ambulatory Visit | Attending: Student | Admitting: Student

## 2021-11-06 DIAGNOSIS — I5042 Chronic combined systolic (congestive) and diastolic (congestive) heart failure: Secondary | ICD-10-CM

## 2021-11-06 NOTE — Progress Notes (Signed)
*  PRELIMINARY RESULTS* ?Echocardiogram ?Limited 2-D Echocardiogram  has been performed. ? ?Corey [name] ?11/06/2021, 1:05 PM ?

## 2021-11-07 ENCOUNTER — Telehealth: Payer: Self-pay | Admitting: *Deleted

## 2021-11-07 DIAGNOSIS — Z79899 Other long term (current) drug therapy: Secondary | ICD-10-CM

## 2021-11-07 LAB — ECHOCARDIOGRAM LIMITED: S' Lateral: 4.8 cm

## 2021-11-07 NOTE — Telephone Encounter (Signed)
Pt notified and order placed 

## 2021-11-07 NOTE — Telephone Encounter (Signed)
-----   Message from Erma Heritage, [place] sent at 11/07/2021  2:40 PM EDT ----- ?Please let the patient know that his repeat echocardiogram shows that the pumping function of his heart is essentially unchanged from 06/2021 and his ejection fraction remains reduced at 25%. Remains on Coreg, Entresto and Lasix with Spironolactone or an SGLT2 inhibitor not being added yet given his renal function. I would recommend seeing if he can have a repeat BMET prior to his visit on 11/12/2021 for reassessment of renal function because if this has improved, would plan to add an SGLT2 inhibitor.  Keep scheduled follow-up for next week and based on symptoms at that time, would plan to discuss additional ischemic evaluation.  ?

## 2021-11-11 ENCOUNTER — Encounter: Payer: Self-pay | Admitting: Physician Assistant

## 2021-11-11 ENCOUNTER — Other Ambulatory Visit (HOSPITAL_COMMUNITY)
Admission: RE | Admit: 2021-11-11 | Discharge: 2021-11-11 | Disposition: A | Discharge status: Home / Self Care | Payer: Medicare HMO | Source: Ambulatory Visit | Attending: Physician Assistant | Admitting: Physician Assistant

## 2021-11-11 ENCOUNTER — Other Ambulatory Visit: Payer: Self-pay

## 2021-11-11 DIAGNOSIS — E785 Hyperlipidemia, unspecified: Secondary | ICD-10-CM | POA: Insufficient documentation

## 2021-11-11 DIAGNOSIS — Z79899 Other long term (current) drug therapy: Secondary | ICD-10-CM

## 2021-11-11 DIAGNOSIS — I25810 Atherosclerosis of coronary artery bypass graft(s) without angina pectoris: Secondary | ICD-10-CM

## 2021-11-11 LAB — CBC
HCT: 39.5 % (ref 39.0–52.0)
Hemoglobin: 12.8 g/dL — ABNORMAL LOW (ref 13.0–17.0)
MCH: 27.6 pg (ref 26.0–34.0)
MCHC: 32.4 g/dL (ref 30.0–36.0)
MCV: 85.3 fL (ref 80.0–100.0)
Platelets: 177 10*3/uL (ref 150–400)
RBC: 4.63 MIL/uL (ref 4.22–5.81)
RDW: 17.4 % — ABNORMAL HIGH (ref 11.5–15.5)
WBC: 4.8 10*3/uL (ref 4.0–10.5)
nRBC: 0 % (ref 0.0–0.2)

## 2021-11-11 LAB — LIPID PANEL
Cholesterol: 101 mg/dL (ref 0–200)
HDL: 35 mg/dL — ABNORMAL LOW (ref >40)
LDL Cholesterol: 46 mg/dL (ref 0–99)
Total CHOL/HDL Ratio: 2.9 RATIO
Triglycerides: 102 mg/dL (ref <150)
VLDL: 20 mg/dL (ref 0–40)

## 2021-11-11 LAB — COMPREHENSIVE METABOLIC PANEL
ALT: 16 U/L (ref 0–44)
AST: 20 U/L (ref 15–41)
Albumin: 3.6 g/dL (ref 3.5–5.0)
Alkaline Phosphatase: 39 U/L (ref 38–126)
Anion gap: 3 — ABNORMAL LOW (ref 5–15)
BUN: 29 mg/dL — ABNORMAL HIGH (ref 8–23)
CO2: 22 mmol/L (ref 22–32)
Calcium: 8.7 mg/dL — ABNORMAL LOW (ref 8.9–10.3)
Chloride: 113 mmol/L — ABNORMAL HIGH (ref 98–111)
Creatinine, Ser: 1.66 mg/dL — ABNORMAL HIGH (ref 0.61–1.24)
GFR, Estimated: 41 mL/min — ABNORMAL LOW (ref >60)
Glucose, Bld: 133 mg/dL — ABNORMAL HIGH (ref 70–99)
Potassium: 4.3 mmol/L (ref 3.5–5.1)
Sodium: 138 mmol/L (ref 135–145)
Total Bilirubin: 0.7 mg/dL (ref 0.3–1.2)
Total Protein: 6.9 g/dL (ref 6.5–8.1)

## 2021-11-11 LAB — TSH: TSH: 3.041 u[IU]/mL (ref 0.350–4.500)

## 2021-11-11 LAB — LDL CHOLESTEROL, DIRECT: Direct LDL: 60.8 mg/dL (ref 0–99)

## 2021-11-11 NOTE — Progress Notes (Addendum)
Cardiology Office Note    Date:  11/12/2021   ID:  Corey [name], DOB 26-Feb-1936, MRN [id number]  PCP:  [facility]  Cardiologist:  Rozann Lesches, MD  Electrophysiologist:  None   Chief Complaint: f/u CHF  History of Present Illness:   Corey [name] is a 86 y.o. male with history of CAD (s/p BMS to RCA and LCx in 2009, DES to D1 and RCA in 2015, CABG in 09/2019 with LIMA-LAD and SVG-D1), HFrEF, HTN, HLD, CKD stage 3b, aspirin allergy who is seen for follow-up. He has predominantly been followed for medication titration of his LV dysfunction recently. At time of CABG his EF was 45-50% pre-operatively. In 05/2021, he began having issues with worsening heart failure. Repeat echo in 06/2021 showed decline to 20-25%, G3DD, mildly reduced RVSP, severe LAE, mild-moderate MR. This prompted medication adjustment though it appears at times the patient was not taking his medications properly. Per Dr. Myles Gip note in 07/2021, "Can consider further ischemic work-up in light of his interval cardiomyopathy depending on how he does with medical therapy adjustments." During medication adjustments earlier this year his Cr did rise from the 1.55 range to 1.85 though remained relatively stable on recheck in 08/2021. Given that this was above his baseline, Corey [name] decreased his Lasix to '20mg'$  daily. Repeat Cr was 1.66 yesterday. Repeat echo 11/06/21 unfortunately continued to show EF 25% with global HK, dilated IVC.   He is seen for follow-up today overall feeling great. He continues to go to the [facility] regularly on the treadmill and bike and denies any chest pain, SOB, dizziness, palpitations. Denies weight gain by home scale. Reports chronic edema. He reports home SBPs running 140-160s. Initial BP 154/68, recheck 158/65 by me.   Labwork independently reviewed: Yesterday K 4.3, BUN 29, Cl 113, Cr 1.66, TSH wnl, Hgb 12.8, plt 177, LDL 42, trig 102 08/2021 K 4.2, Cr  1.85 06/2021 BNP 467, Cr 1.55 05/2020 LDL 118, trig 125, HDL 38 09/2019 Hgb 8.7, plt 132  Cardiology Studies:   Studies reviewed are outlined and summarized above. Reports included below if pertinent.   Limited echo 11/06/21    1. Limited echo.   2. NO significant change in LVEF from echo in Jan 2023.   3. Left ventricular ejection fraction, by estimation, is 25%%. The left  ventricle demonstrates global hypokinesis. There is mild concentric left  ventricular hypertrophy.   4. Right ventricular systolic function is normal. The right ventricular  size is normal.   5. The inferior vena cava is dilated in size with >50% respiratory  variability, suggesting right atrial pressure of 8 mmHg.   Full echo 06/2021  IMPRESSIONS     1. Left ventricular ejection fraction, by estimation, is 20 to 25%. The  left ventricle has severely decreased function. The left ventricle  demonstrates global hypokinesis. There is moderate left ventricular  hypertrophy. Left ventricular diastolic  parameters are consistent with Grade III diastolic dysfunction  (restrictive). Elevated left atrial pressure.   2. Right ventricular systolic function is mildly reduced. The right  ventricular size is normal.   3. Left atrial size was severely dilated.   4. The mitral valve is abnormal. Mild to moderate mitral valve  regurgitation. No evidence of mitral stenosis. Moderate mitral annular  calcification.   5. The aortic valve has an indeterminant number of cusps. Aortic valve  regurgitation is not visualized. No aortic stenosis is present.   6. The inferior vena cava is normal  in size with greater than 50%  respiratory variability, suggesting right atrial pressure of 3 mmHg.   Dopplers 09/2019   Summary:  Right Carotid: Velocities in the right ICA are consistent with a 1-39%  stenosis.   Left Carotid: Velocities in the left ICA are consistent with a 1-39%  stenosis.  Vertebrals: Right vertebral artery  demonstrates antegrade flow. Left  vertebral              artery was not visualized.   Right Upper Extremity: Doppler waveforms remain within normal limits with  right radial compression. Doppler waveform obliterate with right ulnar  compression.  Left Upper Extremity: Doppler waveforms remain within normal limits with  left radial compression. Doppler waveforms remain within normal limits  with left ulnar compression.   Cath 09/2019 Severe single vessel coronary artery disease with 90% ostial/proximal LAD disease followed by 75% stenosis at the bifurcation with previously stented D1 ([place] 0,1,0). Mild to moderate, non-obstructive disease involving ramus intermedius and proximal RCA (DFR 0.94). Previously placed stents in D1, LCx, proximal RCA, and mid RCA are patent, with mild in-stent restenosis noted in the LCx and mid RCA. Mildly to moderately reduced left ventricular contraction (LVEF ~45%) with normal filling pressure.   Recommendations: Images were reviewed with Dr. Gwenlyn Found.  We have agreed that surgical revascularization may be preferential for treatment of complex LAD disease.  PCI to ostial and proximal LAD is feasible but could jeopardize ramus intermedius/LCx and D1. Discontinue clopidogrel pending cardiac surgery consultation.  Heparin infusion to be restarted 2 hours after TR band deflation. Aggressive secondary prevention of coronary artery disease. Follow-up echocardiogram interpretation.   Nelva Bush, MD [facility]  Echo 09/2019   1. Left ventricular ejection fraction, by estimation, is 45 to 50%. The  left ventricle has mildly decreased function. The left ventricle  demonstrates global hypokinesis. There is mild concentric left ventricular  hypertrophy. Left ventricular diastolic  parameters are consistent with Grade I diastolic dysfunction (impaired  relaxation).   2. Right ventricular systolic function is normal. The right ventricular  size is normal.  There is normal pulmonary artery systolic pressure.   3. The mitral valve is normal in structure. Trivial mitral valve  regurgitation. No evidence of mitral stenosis.   4. The aortic valve is normal in structure. Aortic valve regurgitation is  not visualized. No aortic stenosis is present.   5. Aortic dilatation noted. There is mild dilatation at the level of the  sinuses of Valsalva measuring 37 mm.   6. The inferior vena cava is normal in size with <50% respiratory  variability, suggesting right atrial pressure of 8 mmHg.     Past Medical History:  Diagnosis Date   Anaphylaxis    Aspirin   Chronic kidney disease, stage 3b ([place])    Coronary atherosclerosis of native coronary artery    a. s/p BMS to RCA and LCx in 2009 b. DES to D1 and RCA in 2015 c. CABG in 09/2019 with LIMA-LAD and SVG-D1   Essential hypertension    HFrEF (heart failure with reduced ejection fraction) ([place])    a. EF 45-50% in 09/2019 b. EF 20-25% in 06/2021 (had been off medications in the interim)   Hyperlipidemia    Myocardial infarction [facility]) 1998   NSTEMI (non-ST elevated myocardial infarction) ([place]) 03/2008   Seasonal allergies     Past Surgical History:  Procedure Laterality Date   CARDIAC CATHETERIZATION     Cath 05/18/2014 DES to D1 and mid RCA  COLONOSCOPY  05/01/2011   Procedure: COLONOSCOPY;  Surgeon: Daneil Dolin, MD;  [facility];  Service: Endoscopy;  Laterality: N/A;  11:30   CORONARY ANGIOPLASTY WITH STENT PLACEMENT  07/1996; 03/2008; 05/18/2014   "?3; ?2; 2"   CORONARY ARTERY BYPASS GRAFT N/A 10/14/2019   Procedure: CORONARY ARTERY BYPASS GRAFTING (CABG) x 2;  Surgeon: Gaye Pollack, MD;  Location: [facility];  Service: Open Heart Surgery;  Laterality: N/A;   ENDOVEIN HARVEST OF GREATER SAPHENOUS VEIN Right 10/14/2019   Procedure: [place] Of Greater Saphenous Vein;  Surgeon: Gaye Pollack, MD;  [facility];  Service: Open Heart Surgery;  Laterality: Right;    INTRAVASCULAR PRESSURE WIRE/FFR STUDY N/A 10/10/2019   Procedure: INTRAVASCULAR PRESSURE WIRE/FFR STUDY;  Surgeon: Nelva Bush, MD;  Location: [facility] CV LAB;  Service: Cardiovascular;  Laterality: N/A;   LEFT HEART CATH AND CORONARY ANGIOGRAPHY N/A 10/10/2019   Procedure: LEFT HEART CATH AND CORONARY ANGIOGRAPHY;  Surgeon: Nelva Bush, MD;  Location: [facility] CV LAB;  Service: Cardiovascular;  Laterality: N/A;   LEFT HEART CATHETERIZATION WITH CORONARY ANGIOGRAM N/A 05/18/2014   Procedure: LEFT HEART CATHETERIZATION WITH CORONARY ANGIOGRAM;  Surgeon: Blane Ohara, MD;  Location: [facility] CATH LAB;  Service: Cardiovascular;  Laterality: N/A;   TEE WITHOUT CARDIOVERSION N/A 10/14/2019   Procedure: TRANSESOPHAGEAL ECHOCARDIOGRAM (TEE);  Surgeon: Gaye Pollack, MD;  Location: [facility];  Service: Open Heart Surgery;  Laterality: N/A;    Current Medications: Current Meds  Medication Sig   Ascorbic Acid (VITAMIN C) 1000 MG tablet Take 1,000 mg by mouth daily.   atorvastatin (LIPITOR) 80 MG tablet Take 1 tablet daily at 6 pm.   carvedilol (COREG) 3.125 MG tablet Take 1 tablet (3.125 mg total) by mouth 2 (two) times daily. Take in addition to 6.25 mg   carvedilol (COREG) 6.25 MG tablet Take 1 tablet (6.25 mg total) by mouth 2 (two) times daily.   clopidogrel (PLAVIX) 75 MG tablet TAKE 1 TABLET BY MOUTH ONCE DAILY - NEED FOLLOW UP FOR REFILLS   ezetimibe (ZETIA) 10 MG tablet Take 1 tablet by mouth once daily   furosemide (LASIX) 20 MG tablet Take 1 tablet (20 mg total) by mouth daily.   Omega-3 Fatty Acids (FISH OIL) 1000 MG CAPS Take 1,000 mg by mouth 2 (two) times daily.   potassium chloride SA (KLOR-CON M) 20 MEQ tablet Take 1 tablet (20 mEq total) by mouth daily.   sacubitril-valsartan (ENTRESTO) 49-51 MG Take 1 tablet by mouth 2 (two) times daily.   vitamin E 1000 UNIT capsule Take 1 capsule (1,000 Units total) by mouth daily.      Allergies:   Aspirin, Other, and Penicillins    Social History   Socioeconomic History   Marital status: Widowed    Spouse name: Not on file   Number of children: Not on file   Years of education: Not on file   Highest education level: Not on file  Occupational History   Occupation: Retired    Comment: Quarry manager  Tobacco Use   Smoking status: Former    Packs/day: 2.00    Years: 45.00    Pack years: 90.00    Types: Cigarettes    Quit date: 07/31/1996    Years since quitting: 25.3   Smokeless tobacco: Never  Vaping Use   Vaping Use: Never used  Substance and Sexual Activity   Alcohol use: No   Drug use: No   Sexual activity: Not  Currently  Other Topics Concern   Not on file  Social History Narrative   Not on file   Social Determinants of Health   Financial Resource Strain: Not on file  Food Insecurity: Not on file  Transportation Needs: Not on file  Physical Activity: Not on file  Stress: Not on file  Social Connections: Not on file     Family History:  The patient's family history includes Hypertension in an other family member. There is no history of Colon cancer.  ROS:   Please see the history of present illness.  All other systems are reviewed and otherwise negative.    EKG(s)/Additional Labs   EKG:  EKG is ordered today, personally reviewed, demonstrating NSR 68bpm, LVH with QRS widening and repolarization changes, occasional PVCs, nonspecific STTW changes   Recent Labs: 07/08/2021: B Natriuretic Peptide 467.0 11/11/2021: ALT 16; BUN 29; Creatinine, Ser 1.66; Hemoglobin 12.8; Platelets 177; Potassium 4.3; Sodium 138; TSH 3.041  Recent Lipid Panel    Component Value Date/Time   CHOL 101 11/11/2021 1108   TRIG 102 11/11/2021 1108   HDL 35 (L) 11/11/2021 1108   CHOLHDL 2.9 11/11/2021 1108   VLDL 20 11/11/2021 1108   LDLCALC 46 11/11/2021 1108   LDLDIRECT 60.8 11/11/2021 1108    PHYSICAL EXAM:    VS:  BP (!) 154/68   Pulse 68   Ht '5\' 8"'$  (1.727 m)   Wt 229 lb 12.8 oz (104.2 kg)   SpO2 97%    BMI 34.94 kg/m   BMI: Body mass index is 34.94 kg/m.  GEN: Well nourished, well developed male in no acute distress HEENT: normocephalic, atraumatic Neck: no JVD, carotid bruits, or masses Cardiac: RRR; no murmurs, rubs, or gallops, trace edema  Respiratory:  clear to auscultation bilaterally, normal work of breathing GI: soft, rounded, + BS MS: no deformity or atrophy Skin: warm and dry, no rash Neuro:  Alert and Oriented x 3, Strength and sensation are intact, follows commands Psych: euthymic mood, full affect  Wt Readings from Last 3 Encounters:  11/12/21 229 lb 12.8 oz (104.2 kg)  09/06/21 218 lb 9.6 oz (99.2 kg)  08/06/21 216 lb 3.2 oz (98.1 kg)     ASSESSMENT & PLAN:   1. Chronic HFrEF - EF remains 25% despite med titration the last few months. However, he has not achieved optimal BP control yet. Will next plan to titrate Entresto to 97/'103mg'$  BID with BMET in a week and clinical follow-up in 1 month. Weight is up ?11lb by our scale but wearing heavier shoes today and he reports completely stable daily weights at home. Interestingly his baseline weight in 2021/2022 was around 226-227 (as high as 265 in 2020?), though was really quite variable by the last few clinic visits ([phone]). Given weight and trace lower extremity edema, I will hold off on decreasing Lasix/KCl at this time and continue present dosing while we titrate Entresto. If renal function tolerates increase in [place], would have an eye towards SGLT2i as next step. I would also like for him to establish with nephrology as well as below. Given his persistent cardiomyopathy, I reviewed ischemic testing with Dr. Domenic Polite. Per our discussion, defer cardiac catheterization given patient's lack of angina or dyspnea, and pursue noninvasive stress testing. Cardiac PET chosen due to patient's history of prior revascularization. He is agreeable to go to [facility] for this test. Reviewed 2g sodium restriction, 2L fluid restriction,  daily weights with patient. Addendum: See lab result note from 11/29/21 regarding  med changes.  Shared Decision Making/Informed Consent The risks [chest pain, shortness of breath, cardiac arrhythmias, dizziness, blood pressure fluctuations, myocardial infarction, stroke/transient ischemic attack, nausea, vomiting, allergic reaction, radiation exposure, metallic taste sensation and life-threatening complications (estimated to be 1 in 10,000)], benefits (risk stratification, diagnosing coronary artery disease, treatment guidance) and alternatives of a cardiac PET stress test were discussed in detail with Corey [name] and he agrees to proceed.  2. Mitral regurgitation - mild-moderate by echo 06/2021; not specified on 10/2021 study; await PET result commentary.   3. CAD s/p prior PCI, CABG, with hyperlipidemia - doing well without angina. Plan stress test as above. Continue Plavix (allergic to ASA), beta blocker (keep at 6.'25mg'$  + 3.'125mg'$  BID), atorvastatin '80mg'$  daily. Lipids reviewed above and LDL is at goal.  4. Essential HTN - suboptimally controlled. Follow with med changes above.  5. CKD stage 3b - following in plan above. Will also refer to nephrology to establish care.     Disposition: F/u with APP in 1 month, OK to schedule with me in [place] if patient is OK with travel.   Medication Adjustments/Labs and Tests Ordered: Current medicines are reviewed at length with the patient today.  Concerns regarding medicines are outlined above. Medication changes, Labs and Tests ordered today are summarized above and listed in the Patient Instructions accessible in Encounters.    Signed, Charlie Pitter, PA-C  11/12/2021 2:05 PM    [facility] Location in [place], [place] [address] Ph: [phone]; Fax [phone]

## 2021-11-11 NOTE — Progress Notes (Signed)
Chart reviewed in anticipation of visit tomorrow. Per prior recommendations, was advised to get BMET prior to visit. Last note also references getting updated LFTs/lipids at this upcoming visit if not obtained from PCP. Do not see any recent scans available therefore we added on to blood being drawn along with updated CBC since last value in 2021 was anemic.  ?

## 2021-11-12 ENCOUNTER — Encounter: Payer: Self-pay | Admitting: Physician Assistant

## 2021-11-12 ENCOUNTER — Ambulatory Visit: Payer: Medicare HMO | Admitting: Physician Assistant

## 2021-11-12 VITALS — BP 158/65 | HR 68 | Ht 68.0 in | Wt 229.8 lb

## 2021-11-12 DIAGNOSIS — I2510 Atherosclerotic heart disease of native coronary artery without angina pectoris: Secondary | ICD-10-CM

## 2021-11-12 DIAGNOSIS — I340 Nonrheumatic mitral (valve) insufficiency: Secondary | ICD-10-CM

## 2021-11-12 DIAGNOSIS — I10 Essential (primary) hypertension: Secondary | ICD-10-CM

## 2021-11-12 DIAGNOSIS — E785 Hyperlipidemia, unspecified: Secondary | ICD-10-CM | POA: Diagnosis not present

## 2021-11-12 DIAGNOSIS — I5022 Chronic systolic (congestive) heart failure: Secondary | ICD-10-CM | POA: Diagnosis not present

## 2021-11-12 DIAGNOSIS — N1832 Chronic kidney disease, stage 3b: Secondary | ICD-10-CM

## 2021-11-12 MED ORDER — ENTRESTO 97-103 MG PO TABS
1.0000 | ORAL_TABLET | Freq: Two times a day (BID) | ORAL | 3 refills | Status: DC
Start: 2021-11-12 — End: 2022-01-31

## 2021-11-12 NOTE — Patient Instructions (Addendum)
Medication Instructions:   ? ?INCREASE Entresto to 97/103 mg twice a day ? ? ? ?Labwork: ? ?BMET in 1 week- (5/23) ? ?Testing/Procedures: ? ?PET scan at [facility]. They will call you to schedule. ? ? ? ? ?Follow-Up: ? 1 month ? ?Any Other Special Instructions Will Be Listed Below (If Applicable). ? ?You have been referred to Nephrology (kidney doctor) . They will call you to schedule an appointment. ? ? ?If you need a refill on your cardiac medications before your next appointment, please call your pharmacy. ? ? ?How to Prepare for Your Cardiac PET/CT Stress Test: ? ?1. Please do not take these medications before your test:  ? ?Medications that may interfere with the cardiac pharmacological stress agent (ex. nitrates or beta-blockers) the day of the exam. ?Theophylline containing medications for 12 hours. ?Dipyridamole 48 hours prior to the test. ?Your remaining medications may be taken with water. ? ?2. Nothing to eat or drink, except water, 3 hours prior to arrival time.   ?NO caffeine/decaffeinated products, or chocolate 12 hours prior to arrival. ? ?3. NO perfume, cologne or lotion ? ?4. Total time is 1 to 2 hours; you may want to bring reading material for the waiting time. ? ?5. Please report to Admitting at the [facility] Entrance 60 minutes early for your test. ? [place] ? [place], [place] [id number] ? ?Diabetic Preparation:  ?Hold oral medications. ?You may take NPH and Lantus insulin. ?Do not take Humalog or Humulin R (Regular Insulin) the day of your test. ?Check blood sugars prior to leaving the house. ?If able to eat breakfast prior to 3 hour fasting, you may take all medications, including your insulin, ?Do not worry if you miss your breakfast dose of insulin - start at your next meal. ? ?IF YOU THINK YOU MAY BE PREGNANT, OR ARE NURSING PLEASE INFORM THE TECHNOLOGIST. ? ?In preparation for your appointment, medication and supplies will be purchased.  Appointment  availability is limited, so if you need to cancel or reschedule, please call the Radiology Department at [phone]  24 hours in advance to avoid a cancellation fee of $100.00 ? ?What to Expect After you Arrive: ? ?Once you arrive and check in for your appointment, you will be taken to a preparation room within the Radiology Department.  A technologist or Nurse will obtain your medical history, verify that you are correctly prepped for the exam, and explain the procedure.  Afterwards,  an IV will be started in your arm and electrodes will be placed on your skin for EKG monitoring during the stress portion of the exam. Then you will be escorted to the PET/CT scanner.  There, staff will get you positioned on the scanner and obtain a blood pressure and EKG.  During the exam, you will continue to be connected to the EKG and blood pressure machines.  A small, safe amount of a radioactive tracer will be injected in your IV to obtain a series of pictures of your heart along with an injection of a stress agent.   ? ?After your Exam: ? ?It is recommended that you eat a meal and drink a caffeinated beverage to counter act any effects of the stress agent.  Drink plenty of fluids for the remainder of the day and urinate frequently for the first couple of hours after the exam.  Your doctor will inform you of your test results within 7-10 business days. ? ?For questions about your test or  how to prepare for your test, please call: ?Marchia Bond, Cardiac Imaging Nurse Navigator  ?Gordy Clement, Cardiac Imaging Nurse Navigator ?Office: [phone] ? ?

## 2021-11-23 ENCOUNTER — Other Ambulatory Visit: Payer: Self-pay | Admitting: Cardiology

## 2021-11-29 ENCOUNTER — Other Ambulatory Visit (HOSPITAL_COMMUNITY)
Admission: RE | Admit: 2021-11-29 | Discharge: 2021-11-29 | Disposition: A | Discharge status: Home / Self Care | Payer: Medicare HMO | Source: Ambulatory Visit | Attending: Physician Assistant | Admitting: Physician Assistant

## 2021-11-29 DIAGNOSIS — I10 Essential (primary) hypertension: Secondary | ICD-10-CM | POA: Diagnosis present

## 2021-11-29 DIAGNOSIS — N1832 Chronic kidney disease, stage 3b: Secondary | ICD-10-CM | POA: Insufficient documentation

## 2021-11-29 LAB — BASIC METABOLIC PANEL
Anion gap: 5 (ref 5–15)
BUN: 27 mg/dL — ABNORMAL HIGH (ref 8–23)
CO2: 25 mmol/L (ref 22–32)
Calcium: 9 mg/dL (ref 8.9–10.3)
Chloride: 110 mmol/L (ref 98–111)
Creatinine, Ser: 2.1 mg/dL — ABNORMAL HIGH (ref 0.61–1.24)
GFR, Estimated: 31 mL/min — ABNORMAL LOW (ref >60)
Glucose, Bld: 89 mg/dL (ref 70–99)
Potassium: 4.4 mmol/L (ref 3.5–5.1)
Sodium: 140 mmol/L (ref 135–145)

## 2021-12-02 ENCOUNTER — Telehealth: Payer: Self-pay | Admitting: *Deleted

## 2021-12-02 MED ORDER — POTASSIUM CHLORIDE CRYS ER 20 MEQ PO TBCR: 20.0000 meq | EXTENDED_RELEASE_TABLET | ORAL | 3 refills | Status: DC

## 2021-12-02 MED ORDER — FUROSEMIDE 20 MG PO TABS: 20.0000 mg | ORAL_TABLET | ORAL | 3 refills | Status: DC

## 2021-12-02 NOTE — Telephone Encounter (Signed)
-----   Message from Charlie Pitter, [place] sent at 11/29/2021  8:48 PM EDT ----- I called pt this evening re: his labs. Creatinine is slightly higher than prior values. He's had many medicine changes over the last few months so his true dry baseline isn't totally clear. At recent OV we increased his Entresto further to try to optimize his CHF regimen. I had elected to leave Lasix at present value to offset some of the residual mild edema on board. Per his report this evening he has no swelling on exam, no abdominal distention, and has been able to exercise without any complaints. He states weight remains completely stable though cannot recall the number off hand. He does report BP still running the same despite the increase, 140-160s.  At this time I told him I'd like him to decrease his furosemide and potassium to every other day, and that I would send a message to our office for further instructions. Originally I had told him that we would check labs only but reviewing that BP is still running high, I'd like to see if we can get him in for a repeat visit later this upcoming week with either our clinic or pharmD for further med titration to work on BP with repeat BMET at that time. OK to offer other site locations if no [facility] availability.  He reports that nephrology got in touch with him and he has an appointment to establish care with them on 12/11/21. Would otherwise keep f/u with me 01/02/22 in the interim.

## 2021-12-04 ENCOUNTER — Telehealth: Payer: Self-pay | Admitting: Physician Assistant

## 2021-12-04 NOTE — Telephone Encounter (Signed)
Pt c/o medication issue:  1. Name of Medication: sacubitril-valsartan (ENTRESTO) 97-103 MG  2. How are you currently taking this medication (dosage and times per day)? Take 1 tablet by mouth 2 (two) times daily.  3. Are you having a reaction (difficulty breathing--STAT)?   4. What is your medication issue? Patient wants to know if this medication was increased.  He was taking before sacubitril-valsartan (ENTRESTO) 49-51 MG.

## 2021-12-04 NOTE — Telephone Encounter (Signed)
Per pt [employer] had called pt for med pick up 2 meds were ready at [place] picked meds up and when pt was able to look once home Entresto was filled  at the 38/51 Pt was recently increased to 71/103 Pt going to try and take  med back for refund ./cy

## 2021-12-10 ENCOUNTER — Ambulatory Visit (HOSPITAL_BASED_OUTPATIENT_CLINIC_OR_DEPARTMENT_OTHER): Payer: Medicare HMO | Admitting: Family

## 2021-12-10 ENCOUNTER — Encounter (HOSPITAL_BASED_OUTPATIENT_CLINIC_OR_DEPARTMENT_OTHER): Payer: Self-pay | Admitting: Family

## 2021-12-10 VITALS — BP 164/96 | HR 66 | Ht 68.0 in | Wt 232.0 lb

## 2021-12-10 DIAGNOSIS — I5022 Chronic systolic (congestive) heart failure: Secondary | ICD-10-CM | POA: Diagnosis not present

## 2021-12-10 DIAGNOSIS — I25118 Atherosclerotic heart disease of native coronary artery with other forms of angina pectoris: Secondary | ICD-10-CM | POA: Diagnosis not present

## 2021-12-10 DIAGNOSIS — I10 Essential (primary) hypertension: Secondary | ICD-10-CM | POA: Diagnosis not present

## 2021-12-10 DIAGNOSIS — N1832 Chronic kidney disease, stage 3b: Secondary | ICD-10-CM

## 2021-12-10 DIAGNOSIS — E785 Hyperlipidemia, unspecified: Secondary | ICD-10-CM | POA: Diagnosis not present

## 2021-12-10 MED ORDER — CARVEDILOL 12.5 MG PO TABS: 12.5000 mg | ORAL_TABLET | Freq: Two times a day (BID) | ORAL | 3 refills | Status: DC

## 2021-12-10 NOTE — Patient Instructions (Addendum)
Medication Instructions:  Your physician has recommended you make the following change in your medication:   CHANGE Carvedilol to 12.'5mg'$  twice daily *Use up your 3.'125mg'$  tablets by taking 4 tablets twice per day *THEN use up your 6.'25mg'$  tablets by taking 2 tablets twice per day *THEN pick up the new prescription at the pharmacy  CONTINUE Furosemide '20mg'$  every other day   *If you need a refill on your cardiac medications before your next appointment, please call your pharmacy*   Lab Work: Your physician recommends that you return for lab work today: BMP, BNP  If you have labs (blood work) drawn today and your tests are completely normal, you will receive your results only by: MyChart Message (if you have [place]) OR A paper copy in the mail If you have any lab test that is abnormal or we need to change your treatment, we will call you to review the results.   Testing/Procedures: None ordered today.   Follow-Up: At [facility], you and your health needs are our priority.  As part of our continuing mission to provide you with exceptional heart care, we have created designated Provider Care Teams.  These Care Teams include your primary Cardiologist (physician) and Advanced Practice Providers (APPs -  Physician Assistants and Nurse Practitioners) who all work together to provide you with the care you need, when you need it.  We recommend signing up for the patient portal called "MyChart".  Sign up information is provided on this After Visit Summary.  MyChart is used to connect with patients for Virtual Visits (Telemedicine).  Patients are able to view lab/test results, encounter notes, upcoming appointments, etc.  Non-urgent messages can be sent to your provider as well.   To learn more about what you can do with MyChart, go to [email or web address].    Your next appointment:    In 1 month with Loel Dubonnet, NP  AND   In August with Melina Copa, PA   Other  Instructions  Heart Healthy Diet Recommendations: A low-salt diet is recommended. Meats should be grilled, baked, or boiled. Avoid fried foods. Focus on lean protein sources like fish or chicken with vegetables and fruits. The American Heart Association is a Microbiologist!  American Heart Association Diet and Lifeystyle Recommendations   Exercise recommendations: The American Heart Association recommends 150 minutes of moderate intensity exercise weekly. Try 30 minutes of moderate intensity exercise 4-5 times per week. This could include walking, jogging, or swimming.  Recommend weighing daily and keeping a log. Please call our office if you have weight gain of 2 pounds overnight or 5 pounds in 1 week.   Date  Time Weight

## 2021-12-10 NOTE — Progress Notes (Unsigned)
Office Visit    Patient Name: Corey [name] Date of Encounter: 12/11/2021  PCP:  [facility]ll[facility], [facility]  Cardiologist:  Rozann Lesches, MD  Advanced Practice Provider:  No care team member to display Electrophysiologist:  None      Chief Complaint    Corey [name] is a 86 y.o. male with a hx of  CAD (s/p BMS to RCA and LCx in 2009, DES to D1 and RCA in 2015, CABG in 09/2019 with LIMA-LAD and SVG-D1), HFrEF, HTN, HLD, CKD stage 3b, aspirin allergy  presents today for heart failure follow up.    Past Medical History    Past Medical History:  Diagnosis Date   Anaphylaxis    Aspirin   Chronic kidney disease, stage 3b ([place])    Coronary atherosclerosis of native coronary artery    a. s/p BMS to RCA and LCx in 2009 b. DES to D1 and RCA in 2015 c. CABG in 09/2019 with LIMA-LAD and SVG-D1   Essential hypertension    HFrEF (heart failure with reduced ejection fraction) ([place])    a. EF 45-50% in 09/2019 b. EF 20-25% in 06/2021 (had been off medications in the interim)   Hyperlipidemia    Myocardial infarction [facility]) 1998   NSTEMI (non-ST elevated myocardial infarction) ([place]) 03/2008   Seasonal allergies    Past Surgical History:  Procedure Laterality Date   CARDIAC CATHETERIZATION     Cath 05/18/2014 DES to D1 and mid RCA   COLONOSCOPY  05/01/2011   Procedure: COLONOSCOPY;  Surgeon: Daneil Dolin, MD;  [facility];  Service: Endoscopy;  Laterality: N/A;  11:30   CORONARY ANGIOPLASTY WITH STENT PLACEMENT  07/1996; 03/2008; 05/18/2014   "?3; ?2; 2"   CORONARY ARTERY BYPASS GRAFT N/A 10/14/2019   Procedure: CORONARY ARTERY BYPASS GRAFTING (CABG) x 2;  Surgeon: Gaye Pollack, MD;  Location: [place];  Service: Open Heart Surgery;  Laterality: N/A;   ENDOVEIN HARVEST OF GREATER SAPHENOUS VEIN Right 10/14/2019   Procedure: [place] Of Greater Saphenous Vein;  Surgeon: Gaye Pollack, MD;  Location: [facility] OR;   Service: Open Heart Surgery;  Laterality: Right;   INTRAVASCULAR PRESSURE WIRE/FFR STUDY N/A 10/10/2019   Procedure: INTRAVASCULAR PRESSURE WIRE/FFR STUDY;  Surgeon: Nelva Bush, MD;  Location: [place] CV LAB;  Service: Cardiovascular;  Laterality: N/A;   LEFT HEART CATH AND CORONARY ANGIOGRAPHY N/A 10/10/2019   Procedure: LEFT HEART CATH AND CORONARY ANGIOGRAPHY;  Surgeon: Nelva Bush, MD;  Location: [place] CV LAB;  Service: Cardiovascular;  Laterality: N/A;   LEFT HEART CATHETERIZATION WITH CORONARY ANGIOGRAM N/A 05/18/2014   Procedure: LEFT HEART CATHETERIZATION WITH CORONARY ANGIOGRAM;  Surgeon: Blane Ohara, MD;  Location: [facility] CATH LAB;  Service: Cardiovascular;  Laterality: N/A;   TEE WITHOUT CARDIOVERSION N/A 10/14/2019   Procedure: TRANSESOPHAGEAL ECHOCARDIOGRAM (TEE);  Surgeon: Gaye Pollack, MD;  Location: [place];  Service: Open Heart Surgery;  Laterality: N/A;    Allergies  Allergies  Allergen Reactions   Aspirin Anaphylaxis   Other     Foods that are high in acid content cause sores and a rash.    Penicillins Nausea And Vomiting    History of Present Illness    Corey [name] is a 86 y.o. male with a hx of CAD (s/p BMS to RCA and LCx in 2009, DES to D1 and RCA in 2015, CABG in 09/2019 with LIMA-LAD and SVG-D1), HFrEF,  HTN, HLD, CKD stage 3b, aspirin allergy   last seen 11/12/2021 by Melina Copa, PA.  At time of CABG his LVEF was 45 to 50% preoperatively.  December 2022 he began having issues with worsening heart failure.  Repeat echo January 2023 LVEF 20 to 24%, grade 2 diastolic dysfunction, mildly reduced RVSP, severely mild to moderate MR.  Prompted medication adjustment but appears he was not taking them appropriately.  Per Dr. Myles Gip note 07/2021 consider further ischemic work-up depending how he does on medical therapy.  He has had brief fluctuations in renal function.  Repeat echo 11/06/2021 with LVEF 25% with global hypokinesis and dilated IVC.   When last seen 11/12/2021 by Melina Copa he was recommended for cardiac PET and Entresto increased to 97/103 mg twice daily.  11/29/2021 creatinine 2.1, GFR 31.  Decreased from previous 11/11/2021 creatinine 1.66, GFR 41.  His Lasix was reduced to every other day.  Pleasant gentleman presents independently today for follow up. 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 Since last seen feeling well with no chest pain and stable mild exertional dyspnea. Exercising regularly at [facility] on treadmill without difficulty. Mild LE edema which resolves by end of day - does elevate leg. He monitors BP at home checked with readings 130s-170s. HR usually in the 60s. He does weigh daily and reports home weight have been stable. He has log at home but forgot to bring to clinic today.   EKGs/Labs/Other Studies Reviewed:   The following studies were reviewed today: Limited echo 11/06/21    1. Limited echo.   2. NO significant change in LVEF from echo in Jan 2023.   3. Left ventricular ejection fraction, by estimation, is 25%%. The left  ventricle demonstrates global hypokinesis. There is mild concentric left  ventricular hypertrophy.   4. Right ventricular systolic function is normal. The right ventricular  size is normal.   5. The inferior vena cava is dilated in size with >50% respiratory  variability, suggesting right atrial pressure of 8 mmHg.    Full echo 06/2021  IMPRESSIONS     1. Left ventricular ejection fraction, by estimation, is 20 to 25%. The  left ventricle has severely decreased function. The left ventricle  demonstrates global hypokinesis. There is moderate left ventricular  hypertrophy. Left ventricular diastolic  parameters are consistent with Grade III diastolic dysfunction  (restrictive). Elevated left atrial pressure.   2. Right ventricular systolic function is mildly reduced. The right  ventricular size is normal.   3. Left atrial size was severely dilated.   4. The  mitral valve is abnormal. Mild to moderate mitral valve  regurgitation. No evidence of mitral stenosis. Moderate mitral annular  calcification.   5. The aortic valve has an indeterminant number of cusps. Aortic valve  regurgitation is not visualized. No aortic stenosis is present.   6. The inferior vena cava is normal in size with greater than 50%  respiratory variability, suggesting right atrial pressure of 3 mmHg.    Dopplers 09/2019   Summary:  Right Carotid: Velocities in the right ICA are consistent with a 1-39%  stenosis.   Left Carotid: Velocities in the left ICA are consistent with a 1-39%  stenosis.  Vertebrals: Right vertebral artery demonstrates antegrade flow. Left  vertebral              artery was not visualized.   Right Upper Extremity: Doppler waveforms remain within normal limits with  right radial compression. Doppler waveform obliterate  with right ulnar  compression.  Left Upper Extremity: Doppler waveforms remain within normal limits with  left radial compression. Doppler waveforms remain within normal limits  with left ulnar compression.    Cath 09/2019 Severe single vessel coronary artery disease with 90% ostial/proximal LAD disease followed by 75% stenosis at the bifurcation with previously stented D1 ([place] 0,1,0). Mild to moderate, non-obstructive disease involving ramus intermedius and proximal RCA (DFR 0.94). Previously placed stents in D1, LCx, proximal RCA, and mid RCA are patent, with mild in-stent restenosis noted in the LCx and mid RCA. Mildly to moderately reduced left ventricular contraction (LVEF ~45%) with normal filling pressure.   Recommendations: Images were reviewed with Dr. Gwenlyn Found.  We have agreed that surgical revascularization may be preferential for treatment of complex LAD disease.  PCI to ostial and proximal LAD is feasible but could jeopardize ramus intermedius/LCx and D1. Discontinue clopidogrel pending cardiac surgery consultation.   Heparin infusion to be restarted 2 hours after TR band deflation. Aggressive secondary prevention of coronary artery disease. Follow-up echocardiogram interpretation.   Nelva Bush, MD [facility]   Echo 09/2019   1. Left ventricular ejection fraction, by estimation, is 45 to 50%. The  left ventricle has mildly decreased function. The left ventricle  demonstrates global hypokinesis. There is mild concentric left ventricular  hypertrophy. Left ventricular diastolic  parameters are consistent with Grade I diastolic dysfunction (impaired  relaxation).   2. Right ventricular systolic function is normal. The right ventricular  size is normal. There is normal pulmonary artery systolic pressure.   3. The mitral valve is normal in structure. Trivial mitral valve  regurgitation. No evidence of mitral stenosis.   4. The aortic valve is normal in structure. Aortic valve regurgitation is  not visualized. No aortic stenosis is present.   5. Aortic dilatation noted. There is mild dilatation at the level of the  sinuses of Valsalva measuring 37 mm.   6. The inferior vena cava is normal in size with <50% respiratory  variability, suggesting right atrial pressure of 8 mmHg.     EKG: No EKG today  Recent Labs: 07/08/2021: B Natriuretic Peptide 467.0 11/11/2021: ALT 16; Hemoglobin 12.8; Platelets 177; TSH 3.041 11/29/2021: BUN 27; Creatinine, Ser 2.10; Potassium 4.4; Sodium 140  Recent Lipid Panel    Component Value Date/Time   CHOL 101 11/11/2021 1108   TRIG 102 11/11/2021 1108   HDL 35 (L) 11/11/2021 1108   CHOLHDL 2.9 11/11/2021 1108   VLDL 20 11/11/2021 1108   LDLCALC 46 11/11/2021 1108   LDLDIRECT 60.8 11/11/2021 1108     Home Medications   Current Meds  Medication Sig   Ascorbic Acid (VITAMIN C) 1000 MG tablet Take 1,000 mg by mouth daily.   atorvastatin (LIPITOR) 80 MG tablet Take 1 tablet daily at 6 pm.   clopidogrel (PLAVIX) 75 MG tablet TAKE 1 TABLET BY MOUTH ONCE DAILY -  NEED FOLLOW UP FOR REFILLS   ezetimibe (ZETIA) 10 MG tablet Take 1 tablet by mouth once daily   furosemide (LASIX) 20 MG tablet Take 1 tablet (20 mg total) by mouth every other day.   Omega-3 Fatty Acids (FISH OIL) 1000 MG CAPS Take 1,000 mg by mouth 2 (two) times daily.   potassium chloride SA (KLOR-CON M) 20 MEQ tablet Take 1 tablet (20 mEq total) by mouth every other day.   sacubitril-valsartan (ENTRESTO) 97-103 MG Take 1 tablet by mouth 2 (two) times daily.   vitamin E 1000 UNIT capsule Take 1 capsule (  1,000 Units total) by mouth daily.   [DISCONTINUED] carvedilol (COREG) 6.25 MG tablet Take 1 tablet (6.25 mg total) by mouth 2 (two) times daily.     Review of Systems      All other systems reviewed and are otherwise negative except as noted above.  Physical Exam    VS:  BP (!) 164/96 (BP Location: Left Arm)   Pulse 66   Ht '5\' 8"'$  (1.727 m)   Wt 232 lb (105.2 kg)   BMI 35.28 kg/m  , BMI Body mass index is 35.28 kg/m.  Wt Readings from Last 3 Encounters:  12/10/21 232 lb (105.2 kg)  11/12/21 (P) 229 lb 12.8 oz (104.2 kg)  09/06/21 218 lb 9.6 oz (99.2 kg)     GEN: Well nourished, overweight, well developed, in no acute distress. HEENT: normal. Neck: Supple, no JVD, carotid bruits, or masses. Cardiac: RRR, no murmurs, rubs, or gallops. No clubbing, cyanosis, edema.  Radials/PT 2+ and equal bilaterally.  Respiratory:  Respirations regular and unlabored, clear to auscultation bilaterally. GI: Soft, nontender, nondistended. MS: No deformity or atrophy. Skin: Warm and dry, no rash. Neuro:  Strength and sensation are intact. Psych: Normal affect.  Assessment & Plan    HFrEF - 10/2021 echo LVEF 25% despite med titration. PET upcoming 12/2021 for ischemic evaluation. Reports stable weights at home. Weights have been variable in clinic. Optimization of GDMT has been somewhat limited by fluctuations in renal function. 11/29/21 creatinine 2.1, GFR 31 and Lasix reduced to '20mg'$  3x per week.  Update BMP, BNP today. Continue Entresto 97-'103mg'$  BID. Presently taking carvedilol 9.375 BID (one 3.125 and one 6.25 tablet twice per day). For consolidation and increased BP control, increase to Coreg 12.'5mg'$  BID. Of note, his current medication bottles were labeled so he could appropriately use up tablets at home as well as new Rx sent to pharmacy. Pending results of renal function for optimization of GDMT and BP control - if renal function improved add SGLT2i and if not add Hydralazine. Low sodium diet, fluid restriction <2L, and daily weights encouraged. Educated to contact our office for weight gain of 2 lbs overnight or 5 lbs in one week.   MR - 06/2021 mild to moderate by echo. Not specified on 10/2021 study. PET upcoming 12/2021.   HTN - BP not at goal. Increase Coreg as above. Further titration pending BMP today.  HLD, LDL goal <70 - Continue Zetia, Atorvastatin. Denies myalgias.   CAD - Prior CABG. Given HFrEF ischemic eval upcoming with cardiac PET 12/2021. GDMT includes atorvastatin, coreg, plavix. No aspirin due to allergy. Heart healthy diet and regular cardiovascular exercise encouraged.    CKD IIIb - Careful titration of diuretic and antihypertensive.  Has been referred to nephrology. BMP, BNP today for monitoring.   Disposition: Follow up in 1 month with Loel Dubonnet, NP and in August with Melina Copa, PA in [place] office.  Signed, Loel Dubonnet, NP 12/11/2021, 9:16 AM [place]

## 2021-12-11 ENCOUNTER — Encounter (HOSPITAL_BASED_OUTPATIENT_CLINIC_OR_DEPARTMENT_OTHER): Payer: Self-pay | Admitting: Family

## 2021-12-11 LAB — BASIC METABOLIC PANEL
BUN/Creatinine Ratio: 11 (ref 10–24)
BUN: 19 mg/dL (ref 8–27)
CO2: 23 mmol/L (ref 20–29)
Calcium: 9.7 mg/dL (ref 8.6–10.2)
Chloride: 107 mmol/L — ABNORMAL HIGH (ref 96–106)
Creatinine, Ser: 1.74 mg/dL — ABNORMAL HIGH (ref 0.76–1.27)
Glucose: 84 mg/dL (ref 70–99)
Potassium: 5.3 mmol/L — ABNORMAL HIGH (ref 3.5–5.2)
Sodium: 145 mmol/L — ABNORMAL HIGH (ref 134–144)
eGFR: 38 mL/min/{1.73_m2} — ABNORMAL LOW (ref >59)

## 2021-12-11 LAB — BRAIN NATRIURETIC PEPTIDE: BNP: 799.3 pg/mL — ABNORMAL HIGH (ref 0.0–100.0)

## 2021-12-12 ENCOUNTER — Telehealth (HOSPITAL_BASED_OUTPATIENT_CLINIC_OR_DEPARTMENT_OTHER): Payer: Self-pay

## 2021-12-12 DIAGNOSIS — I10 Essential (primary) hypertension: Secondary | ICD-10-CM

## 2021-12-12 NOTE — Telephone Encounter (Signed)
RN returned call patient and provided the following recommendations. RN to contact Nephrology office in the am to get records.      "Defer medication changes at this time. Proceed with plan as described with nephrology. If he has labs upcoming with them, does not need to have duplicate labs with [place]. Can we call nephrology in the morning to get a copy of his labs? His potassium may have been falsely elevated  but if it was elevated also on their labs may need to make an adjustment.    Corey Dubonnet, NP "

## 2021-12-12 NOTE — Telephone Encounter (Signed)
Defer medication changes at this time. Proceed with plan as described with nephrology. If he has labs upcoming with them, does not need to have duplicate labs with [place]. Can we call nephrology in the morning to get a copy of his labs? His potassium may have been falsely elevated  but if it was elevated also on their labs may need to make an adjustment.   Loel Dubonnet, NP

## 2021-12-12 NOTE — Telephone Encounter (Addendum)
Results called to patient who verbalizes understanding!  Patient states he saw his kidney doctor and is scheduled for labs so he will go at the same time for our labs. Patient states the kidney doctor stopped his potassium and started him on spironolactone every other day.   No orders placed at this time update sent to C. Walker for review   Patient would like his recent lab results sent to his kidney doctor when final decisions are made in regards to the update he provided today. Dr. Levell July     ----- Message from Loel Dubonnet, NP sent at 12/12/2021  1:31 PM EDT ----- Kidney function improved back to his approximate baseline.  BMP shows volume overload.  Potassium mildly elevated.  Increase Lasix to 20 mg daily.  Keep potassium at every other day.  Recommend repeat BMP Friday or Monday (at [place] in [place]) to reassess.

## 2021-12-13 NOTE — Telephone Encounter (Signed)
Fax successfully sent am of 6/15, no response, routing back to C. Walker, NP to follow up next week.

## 2021-12-17 NOTE — Telephone Encounter (Signed)
Spoke with patient and he stated he was going Friday to get labs for Nephrology  Called and spoke with [place] and they are collecting CMET through [place] fax number and they will fax labs when received

## 2021-12-17 NOTE — Telephone Encounter (Signed)
Called nephrology who stated labs ordered but not yet collected.   Recommend he have repeat BMP this week to reassess kidney function and potassium.  Potassium level could be falsely elevated by recent labs.  However if remains elevated will need to consider discontinuation of spironolactone.  Loel Dubonnet, NP

## 2021-12-17 NOTE — Telephone Encounter (Signed)
That is fine. Thank you for calling. I'll keep in my inbasket so I remember to looks for labs Monday.   Loel Dubonnet, NP

## 2021-12-17 NOTE — Addendum Note (Signed)
Addended by: Loel Dubonnet on: 12/17/2021 04:21 PM   Modules accepted: Orders

## 2021-12-25 ENCOUNTER — Other Ambulatory Visit (HOSPITAL_COMMUNITY)
Admission: RE | Admit: 2021-12-25 | Discharge: 2021-12-25 | Disposition: A | Discharge status: Home / Self Care | Payer: Medicare HMO | Attending: Nephrology | Admitting: Nephrology

## 2021-12-25 DIAGNOSIS — Z951 Presence of aortocoronary bypass graft: Secondary | ICD-10-CM | POA: Diagnosis not present

## 2021-12-25 DIAGNOSIS — D638 Anemia in other chronic diseases classified elsewhere: Secondary | ICD-10-CM | POA: Insufficient documentation

## 2021-12-25 DIAGNOSIS — N1832 Chronic kidney disease, stage 3b: Secondary | ICD-10-CM | POA: Diagnosis not present

## 2021-12-25 DIAGNOSIS — N170 Acute kidney failure with tubular necrosis: Secondary | ICD-10-CM | POA: Diagnosis not present

## 2021-12-25 DIAGNOSIS — Z79899 Other long term (current) drug therapy: Secondary | ICD-10-CM | POA: Insufficient documentation

## 2021-12-25 DIAGNOSIS — I2510 Atherosclerotic heart disease of native coronary artery without angina pectoris: Secondary | ICD-10-CM | POA: Diagnosis not present

## 2021-12-25 DIAGNOSIS — I129 Hypertensive chronic kidney disease with stage 1 through stage 4 chronic kidney disease, or unspecified chronic kidney disease: Secondary | ICD-10-CM | POA: Insufficient documentation

## 2021-12-25 DIAGNOSIS — I5043 Acute on chronic combined systolic (congestive) and diastolic (congestive) heart failure: Secondary | ICD-10-CM | POA: Insufficient documentation

## 2021-12-25 LAB — COMPREHENSIVE METABOLIC PANEL
ALT: 23 U/L (ref 0–44)
AST: 23 U/L (ref 15–41)
Albumin: 3.5 g/dL (ref 3.5–5.0)
Alkaline Phosphatase: 38 U/L (ref 38–126)
Anion gap: 6 (ref 5–15)
BUN: 23 mg/dL (ref 8–23)
CO2: 23 mmol/L (ref 22–32)
Calcium: 8.9 mg/dL (ref 8.9–10.3)
Chloride: 107 mmol/L (ref 98–111)
Creatinine, Ser: 1.73 mg/dL — ABNORMAL HIGH (ref 0.61–1.24)
GFR, Estimated: 39 mL/min — ABNORMAL LOW (ref >60)
Glucose, Bld: 116 mg/dL — ABNORMAL HIGH (ref 70–99)
Potassium: 4.3 mmol/L (ref 3.5–5.1)
Sodium: 136 mmol/L (ref 135–145)
Total Bilirubin: 0.8 mg/dL (ref 0.3–1.2)
Total Protein: 7 g/dL (ref 6.5–8.1)

## 2021-12-25 LAB — CBC WITH DIFFERENTIAL/PLATELET
Abs Immature Granulocytes: 0 10*3/uL (ref 0.00–0.07)
Basophils Absolute: 0 10*3/uL (ref 0.0–0.1)
Basophils Relative: 1 %
Eosinophils Absolute: 0.3 10*3/uL (ref 0.0–0.5)
Eosinophils Relative: 7 %
HCT: 39 % (ref 39.0–52.0)
Hemoglobin: 13 g/dL (ref 13.0–17.0)
Immature Granulocytes: 0 %
Lymphocytes Relative: 35 %
Lymphs Abs: 1.6 10*3/uL (ref 0.7–4.0)
MCH: 27.7 pg (ref 26.0–34.0)
MCHC: 33.3 g/dL (ref 30.0–36.0)
MCV: 83.2 fL (ref 80.0–100.0)
Monocytes Absolute: 0.4 10*3/uL (ref 0.1–1.0)
Monocytes Relative: 10 %
Neutro Abs: 2.2 10*3/uL (ref 1.7–7.7)
Neutrophils Relative %: 47 %
Platelets: 183 10*3/uL (ref 150–400)
RBC: 4.69 MIL/uL (ref 4.22–5.81)
RDW: 15.9 % — ABNORMAL HIGH (ref 11.5–15.5)
WBC: 4.6 10*3/uL (ref 4.0–10.5)
nRBC: 0 % (ref 0.0–0.2)

## 2021-12-25 LAB — URINALYSIS, ROUTINE W REFLEX MICROSCOPIC
Bacteria, UA: NONE SEEN
Bilirubin Urine: NEGATIVE
Glucose, UA: NEGATIVE mg/dL
Hgb urine dipstick: NEGATIVE
Ketones, ur: NEGATIVE mg/dL
Leukocytes,Ua: NEGATIVE
Nitrite: NEGATIVE
Protein, ur: 30 mg/dL — AB
Specific Gravity, Urine: 1.009 (ref 1.005–1.030)
pH: 6 (ref 5.0–8.0)

## 2021-12-25 LAB — PROTEIN, URINE, 24 HOUR
Collection Interval-UPROT: 24 hours
Protein, 24H Urine: 720 mg/d — ABNORMAL HIGH (ref 50–100)
Urine Total Volume-UPROT: 2250 mL

## 2021-12-25 LAB — CREATININE CLEARANCE, URINE, 24 HOUR
Collection Interval-CRCL: 24 hours
Creatinine Clearance: 62 mL/min — ABNORMAL LOW (ref 75–125)
Creatinine, 24H Ur: 1538 mg/d (ref 800–2000)
Creatinine, Urine: 68.37 mg/dL
Urine Total Volume-CRCL: 2250 mL

## 2021-12-25 LAB — IRON AND TIBC
Iron: 83 ug/dL (ref 45–182)
Saturation Ratios: 27 % (ref 17.9–39.5)
TIBC: 310 ug/dL (ref 250–450)
UIBC: 227 ug/dL

## 2021-12-25 LAB — PROTEIN / CREATININE RATIO, URINE
Creatinine, Urine: 65.99 mg/dL
Protein Creatinine Ratio: 0.48 mg/mg{Cre} — ABNORMAL HIGH (ref 0.00–0.15)
Total Protein, Urine: 32 mg/dL

## 2021-12-25 LAB — FERRITIN: Ferritin: 31 ng/mL (ref 24–336)

## 2021-12-25 LAB — VITAMIN D 25 HYDROXY (VIT D DEFICIENCY, FRACTURES): Vit D, 25-Hydroxy: 22.43 ng/mL — ABNORMAL LOW (ref 30–100)

## 2021-12-25 LAB — URIC ACID: Uric Acid, Serum: 6.4 mg/dL (ref 3.7–8.6)

## 2021-12-25 LAB — HIV ANTIBODY (ROUTINE TESTING W REFLEX): HIV Screen 4th Generation wRfx: NONREACTIVE

## 2021-12-25 LAB — MAGNESIUM: Magnesium: 1.9 mg/dL (ref 1.7–2.4)

## 2021-12-25 LAB — HEMOGLOBIN A1C
Hgb A1c MFr Bld: 5.9 % — ABNORMAL HIGH (ref 4.8–5.6)
Mean Plasma Glucose: 122.63 mg/dL

## 2021-12-25 LAB — HEPATITIS B SURFACE ANTIGEN: Hepatitis B Surface Ag: NONREACTIVE

## 2021-12-25 LAB — HEPATITIS C ANTIBODY: HCV Ab: NONREACTIVE

## 2021-12-25 LAB — PHOSPHORUS: Phosphorus: 3.1 mg/dL (ref 2.5–4.6)

## 2021-12-25 LAB — CREATININE, URINE, RANDOM: Creatinine, Urine: 66.34 mg/dL

## 2021-12-25 LAB — VITAMIN B12: Vitamin B-12: 160 pg/mL — ABNORMAL LOW (ref 180–914)

## 2021-12-25 NOTE — Telephone Encounter (Signed)
Labs requested

## 2021-12-26 LAB — ANA: Anti Nuclear Antibody (ANA): NEGATIVE

## 2021-12-26 LAB — HEPATITIS B SURFACE ANTIBODY, QUANTITATIVE: Hep B S AB Quant (Post): <3.1 m[IU]/mL — ABNORMAL LOW (ref >9.9)

## 2021-12-26 LAB — KAPPA/LAMBDA LIGHT CHAINS
Kappa free light chain: 49.8 mg/L — ABNORMAL HIGH (ref 3.3–19.4)
Kappa, lambda light chain ratio: 1.84 — ABNORMAL HIGH (ref 0.26–1.65)
Lambda free light chains: 27.1 mg/L — ABNORMAL HIGH (ref 5.7–26.3)

## 2021-12-26 LAB — ANCA TITERS
Atypical P-ANCA titer: <1:20 {titer}
C-ANCA: <1:20 {titer}
P-ANCA: <1:20 {titer}

## 2021-12-26 LAB — C4 COMPLEMENT: Complement C4, Body Fluid: 28 mg/dL (ref 12–38)

## 2021-12-26 LAB — C3 COMPLEMENT: C3 Complement: 138 mg/dL (ref 82–167)

## 2021-12-26 NOTE — Telephone Encounter (Signed)
Received note from nephrology that labs not yet resulted. No changes at this time.   Loel Dubonnet, NP

## 2021-12-27 LAB — PROTEIN ELECTROPHORESIS, SERUM
A/G Ratio: 1.1 (ref 0.7–1.7)
Albumin ELP: 3.6 g/dL (ref 2.9–4.4)
Alpha-1-Globulin: 0.2 g/dL (ref 0.0–0.4)
Alpha-2-Globulin: 0.6 g/dL (ref 0.4–1.0)
Beta Globulin: 1.2 g/dL (ref 0.7–1.3)
Gamma Globulin: 1.1 g/dL (ref 0.4–1.8)
Globulin, Total: 3.2 g/dL (ref 2.2–3.9)
Total Protein ELP: 6.8 g/dL (ref 6.0–8.5)

## 2021-12-27 LAB — MISC LABCORP TEST (SEND OUT): Labcorp test code: 141330

## 2021-12-27 LAB — IMMUNOFIXATION, URINE

## 2021-12-30 LAB — PROTEIN ELECTROPHORESIS, SERUM
A/G Ratio: 1.1 (ref 0.7–1.7)
Albumin ELP: 3.3 g/dL (ref 2.9–4.4)
Alpha-1-Globulin: 0.2 g/dL (ref 0.0–0.4)
Alpha-2-Globulin: 0.6 g/dL (ref 0.4–1.0)
Beta Globulin: 1.2 g/dL (ref 0.7–1.3)
Gamma Globulin: 1.1 g/dL (ref 0.4–1.8)
Globulin, Total: 3.1 g/dL (ref 2.2–3.9)
Total Protein ELP: 6.4 g/dL (ref 6.0–8.5)

## 2021-12-30 LAB — HEPATITIS C GENOTYPE

## 2022-01-02 ENCOUNTER — Ambulatory Visit: Payer: Medicare HMO | Admitting: Physician Assistant

## 2022-01-03 LAB — MISC LABCORP TEST (SEND OUT): Labcorp test code: 520137

## 2022-01-06 ENCOUNTER — Other Ambulatory Visit: Payer: Self-pay | Admitting: Nephrology

## 2022-01-06 ENCOUNTER — Other Ambulatory Visit (HOSPITAL_COMMUNITY): Payer: Self-pay | Admitting: Nephrology

## 2022-01-06 DIAGNOSIS — N170 Acute kidney failure with tubular necrosis: Secondary | ICD-10-CM

## 2022-01-06 DIAGNOSIS — I129 Hypertensive chronic kidney disease with stage 1 through stage 4 chronic kidney disease, or unspecified chronic kidney disease: Secondary | ICD-10-CM

## 2022-01-07 ENCOUNTER — Ambulatory Visit (HOSPITAL_BASED_OUTPATIENT_CLINIC_OR_DEPARTMENT_OTHER)
Admission: RE | Admit: 2022-01-07 | Discharge: 2022-01-07 | Disposition: A | Discharge status: Home / Self Care | Payer: Medicare HMO | Source: Ambulatory Visit | Attending: Nephrology | Admitting: Nephrology

## 2022-01-07 ENCOUNTER — Ambulatory Visit (HOSPITAL_BASED_OUTPATIENT_CLINIC_OR_DEPARTMENT_OTHER): Payer: Medicare HMO | Admitting: Family

## 2022-01-07 ENCOUNTER — Encounter (HOSPITAL_BASED_OUTPATIENT_CLINIC_OR_DEPARTMENT_OTHER): Payer: Self-pay | Admitting: Family

## 2022-01-07 VITALS — BP 171/83 | HR 71 | Ht 68.0 in | Wt 223.6 lb

## 2022-01-07 DIAGNOSIS — E785 Hyperlipidemia, unspecified: Secondary | ICD-10-CM | POA: Diagnosis not present

## 2022-01-07 DIAGNOSIS — N183 Chronic kidney disease, stage 3 unspecified: Secondary | ICD-10-CM | POA: Diagnosis present

## 2022-01-07 DIAGNOSIS — I129 Hypertensive chronic kidney disease with stage 1 through stage 4 chronic kidney disease, or unspecified chronic kidney disease: Secondary | ICD-10-CM | POA: Insufficient documentation

## 2022-01-07 DIAGNOSIS — N1832 Chronic kidney disease, stage 3b: Secondary | ICD-10-CM | POA: Diagnosis not present

## 2022-01-07 DIAGNOSIS — I5022 Chronic systolic (congestive) heart failure: Secondary | ICD-10-CM

## 2022-01-07 DIAGNOSIS — I10 Essential (primary) hypertension: Secondary | ICD-10-CM

## 2022-01-07 DIAGNOSIS — I25118 Atherosclerotic heart disease of native coronary artery with other forms of angina pectoris: Secondary | ICD-10-CM

## 2022-01-07 DIAGNOSIS — N170 Acute kidney failure with tubular necrosis: Secondary | ICD-10-CM | POA: Diagnosis present

## 2022-01-07 DIAGNOSIS — I340 Nonrheumatic mitral (valve) insufficiency: Secondary | ICD-10-CM

## 2022-01-07 NOTE — Progress Notes (Signed)
Office Visit    Patient Name: Corey [name] Date of Encounter: 01/07/2022  PCP:  [facility]  Cardiologist:  Rozann Lesches, MD  Advanced Practice Provider:  No care team member to display Electrophysiologist:  None      Chief Complaint    Corey [name] is a 86 y.o. male with a hx of  CAD (s/p BMS to RCA and LCx in 2009, DES to D1 and RCA in 2015, CABG in 09/2019 with LIMA-LAD and SVG-D1), HFrEF, HTN, HLD, CKD stage 3b, aspirin allergy  presents today for heart failure follow up.    Past Medical History    Past Medical History:  Diagnosis Date   Anaphylaxis    Aspirin   Chronic kidney disease, stage 3b ([place])    Coronary atherosclerosis of native coronary artery    a. s/p BMS to RCA and LCx in 2009 b. DES to D1 and RCA in 2015 c. CABG in 09/2019 with LIMA-LAD and SVG-D1   Essential hypertension    HFrEF (heart failure with reduced ejection fraction) ([place])    a. EF 45-50% in 09/2019 b. EF 20-25% in 06/2021 (had been off medications in the interim)   Hyperlipidemia    Myocardial infarction [facility]) 1998   NSTEMI (non-ST elevated myocardial infarction) ([place]) 03/2008   Seasonal allergies    Past Surgical History:  Procedure Laterality Date   CARDIAC CATHETERIZATION     Cath 05/18/2014 DES to D1 and mid RCA   COLONOSCOPY  05/01/2011   Procedure: COLONOSCOPY;  Surgeon: Daneil Dolin, MD;  [facility];  Service: Endoscopy;  Laterality: N/A;  11:30   CORONARY ANGIOPLASTY WITH STENT PLACEMENT  07/1996; 03/2008; 05/18/2014   "?3; ?2; 2"   CORONARY ARTERY BYPASS GRAFT N/A 10/14/2019   Procedure: CORONARY ARTERY BYPASS GRAFTING (CABG) x 2;  Surgeon: Gaye Pollack, MD;  Location: [place];  Service: Open Heart Surgery;  Laterality: N/A;   ENDOVEIN HARVEST OF GREATER SAPHENOUS VEIN Right 10/14/2019   Procedure: [place] Of Greater Saphenous Vein;  Surgeon: Gaye Pollack, MD;  Location: [facility] OR;   Service: Open Heart Surgery;  Laterality: Right;   INTRAVASCULAR PRESSURE WIRE/FFR STUDY N/A 10/10/2019   Procedure: INTRAVASCULAR PRESSURE WIRE/FFR STUDY;  Surgeon: Nelva Bush, MD;  Location: [place] CV LAB;  Service: Cardiovascular;  Laterality: N/A;   LEFT HEART CATH AND CORONARY ANGIOGRAPHY N/A 10/10/2019   Procedure: LEFT HEART CATH AND CORONARY ANGIOGRAPHY;  Surgeon: Nelva Bush, MD;  Location: [place] CV LAB;  Service: Cardiovascular;  Laterality: N/A;   LEFT HEART CATHETERIZATION WITH CORONARY ANGIOGRAM N/A 05/18/2014   Procedure: LEFT HEART CATHETERIZATION WITH CORONARY ANGIOGRAM;  Surgeon: Blane Ohara, MD;  Location: [facility] CATH LAB;  Service: Cardiovascular;  Laterality: N/A;   TEE WITHOUT CARDIOVERSION N/A 10/14/2019   Procedure: TRANSESOPHAGEAL ECHOCARDIOGRAM (TEE);  Surgeon: Gaye Pollack, MD;  Location: [place];  Service: Open Heart Surgery;  Laterality: N/A;    Allergies  Allergies  Allergen Reactions   Aspirin Anaphylaxis   Other     Foods that are high in acid content cause sores and a rash.    Penicillins Nausea And Vomiting    History of Present Illness    Corey [name] is a 86 y.o. male with a hx of CAD (s/p BMS to RCA and LCx in 2009, DES to D1 and RCA in 2015, CABG in 09/2019 with LIMA-LAD and SVG-D1), HFrEF,  HTN, HLD, CKD stage 3b, aspirin allergy   last seen 11/12/2021 by Melina Copa, PA.  At time of CABG his LVEF was 45 to 50% preoperatively.  December 2022 he began having issues with worsening heart failure.  Repeat echo January 2023 LVEF 20 to 36%, grade 2 diastolic dysfunction, mildly reduced RVSP, severely mild to moderate MR.  Prompted medication adjustment but appears he was not taking them appropriately.  Per Dr. Myles Gip note 07/2021 consider further ischemic work-up depending how he does on medical therapy.  He has had brief fluctuations in renal function.  Repeat echo 11/06/2021 with LVEF 25% with global hypokinesis and dilated IVC.   When last seen 11/12/2021 by Melina Copa he was recommended for cardiac PET and Entresto increased to 97/103 mg twice daily.  11/29/2021 creatinine 2.1, GFR 31.  Decreased from previous 11/11/2021 creatinine 1.66, GFR 41.  His Lasix was reduced to every other day.  Pleasant gentleman presents independently today for follow up. 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 Since last seen feeling well with no chest pain and stable mild exertional dyspnea. Exercising regularly at [facility] on treadmill without difficulty. Mild LE edema which resolves by end of day - does elevate leg. He monitors BP at home checked with readings 130s-170s. HR usually in the 60s. He does weigh daily and reports home weight have been stable. He has log at home but forgot to bring to clinic today.   Labs 12/25/21 with nephrology (available under media) K 4.3, creatinin 1.73, Ph 3.1, Mg 1.9, AST 23, ALT 23, GFR 39, total protein 6.4 Wbc 4.6, Hb 13, Hct 39, plt 183 ANA negative Kapp free light 49.8, lamba free light 27.1, kappa, lambda light 1.84 (all elevated) Protein creatinine urine 0.48  EKGs/Labs/Other Studies Reviewed:   The following studies were reviewed today: Limited echo 11/06/21    1. Limited echo.   2. NO significant change in LVEF from echo in Jan 2023.   3. Left ventricular ejection fraction, by estimation, is 25%%. The left  ventricle demonstrates global hypokinesis. There is mild concentric left  ventricular hypertrophy.   4. Right ventricular systolic function is normal. The right ventricular  size is normal.   5. The inferior vena cava is dilated in size with >50% respiratory  variability, suggesting right atrial pressure of 8 mmHg.    Full echo 06/2021  IMPRESSIONS     1. Left ventricular ejection fraction, by estimation, is 20 to 25%. The  left ventricle has severely decreased function. The left ventricle  demonstrates global hypokinesis. There is moderate left ventricular   hypertrophy. Left ventricular diastolic  parameters are consistent with Grade III diastolic dysfunction  (restrictive). Elevated left atrial pressure.   2. Right ventricular systolic function is mildly reduced. The right  ventricular size is normal.   3. Left atrial size was severely dilated.   4. The mitral valve is abnormal. Mild to moderate mitral valve  regurgitation. No evidence of mitral stenosis. Moderate mitral annular  calcification.   5. The aortic valve has an indeterminant number of cusps. Aortic valve  regurgitation is not visualized. No aortic stenosis is present.   6. The inferior vena cava is normal in size with greater than 50%  respiratory variability, suggesting right atrial pressure of 3 mmHg.    Dopplers 09/2019   Summary:  Right Carotid: Velocities in the right ICA are consistent with a 1-39%  stenosis.   Left Carotid: Velocities in the left ICA are  consistent with a 1-39%  stenosis.  Vertebrals: Right vertebral artery demonstrates antegrade flow. Left  vertebral              artery was not visualized.   Right Upper Extremity: Doppler waveforms remain within normal limits with  right radial compression. Doppler waveform obliterate with right ulnar  compression.  Left Upper Extremity: Doppler waveforms remain within normal limits with  left radial compression. Doppler waveforms remain within normal limits  with left ulnar compression.    Cath 09/2019 Severe single vessel coronary artery disease with 90% ostial/proximal LAD disease followed by 75% stenosis at the bifurcation with previously stented D1 ([place] 0,1,0). Mild to moderate, non-obstructive disease involving ramus intermedius and proximal RCA (DFR 0.94). Previously placed stents in D1, LCx, proximal RCA, and mid RCA are patent, with mild in-stent restenosis noted in the LCx and mid RCA. Mildly to moderately reduced left ventricular contraction (LVEF ~45%) with normal filling pressure.    Recommendations: Images were reviewed with Dr. Gwenlyn Found.  We have agreed that surgical revascularization may be preferential for treatment of complex LAD disease.  PCI to ostial and proximal LAD is feasible but could jeopardize ramus intermedius/LCx and D1. Discontinue clopidogrel pending cardiac surgery consultation.  Heparin infusion to be restarted 2 hours after TR band deflation. Aggressive secondary prevention of coronary artery disease. Follow-up echocardiogram interpretation.   Nelva Bush, MD [facility]   Echo 09/2019   1. Left ventricular ejection fraction, by estimation, is 45 to 50%. The  left ventricle has mildly decreased function. The left ventricle  demonstrates global hypokinesis. There is mild concentric left ventricular  hypertrophy. Left ventricular diastolic  parameters are consistent with Grade I diastolic dysfunction (impaired  relaxation).   2. Right ventricular systolic function is normal. The right ventricular  size is normal. There is normal pulmonary artery systolic pressure.   3. The mitral valve is normal in structure. Trivial mitral valve  regurgitation. No evidence of mitral stenosis.   4. The aortic valve is normal in structure. Aortic valve regurgitation is  not visualized. No aortic stenosis is present.   5. Aortic dilatation noted. There is mild dilatation at the level of the  sinuses of Valsalva measuring 37 mm.   6. The inferior vena cava is normal in size with <50% respiratory  variability, suggesting right atrial pressure of 8 mmHg.     EKG: No EKG today  Recent Labs: 11/11/2021: TSH 3.041 12/10/2021: BNP 799.3 12/25/2021: ALT 23; BUN 23; Creatinine, Ser 1.73; Hemoglobin 13.0; Magnesium 1.9; Platelets 183; Potassium 4.3; Sodium 136  Recent Lipid Panel    Component Value Date/Time   CHOL 101 11/11/2021 1108   TRIG 102 11/11/2021 1108   HDL 35 (L) 11/11/2021 1108   CHOLHDL 2.9 11/11/2021 1108   VLDL 20 11/11/2021 1108   LDLCALC 46  11/11/2021 1108   LDLDIRECT 60.8 11/11/2021 1108     Home Medications   Current Meds  Medication Sig   Ascorbic Acid (VITAMIN C) 1000 MG tablet Take 1,000 mg by mouth daily.   atorvastatin (LIPITOR) 80 MG tablet Take 1 tablet daily at 6 pm.   carvedilol (COREG) 12.5 MG tablet Take 1 tablet (12.5 mg total) by mouth 2 (two) times daily.   clopidogrel (PLAVIX) 75 MG tablet TAKE 1 TABLET BY MOUTH ONCE DAILY - NEED FOLLOW UP FOR REFILLS   ezetimibe (ZETIA) 10 MG tablet Take 1 tablet by mouth once daily   furosemide (LASIX) 20 MG tablet Take 20 mg by  mouth every other day.   Omega-3 Fatty Acids (FISH OIL) 1000 MG CAPS Take 1,000 mg by mouth 2 (two) times daily.   sacubitril-valsartan (ENTRESTO) 97-103 MG Take 1 tablet by mouth 2 (two) times daily.   spironolactone (ALDACTONE) 25 MG tablet Take 25 mg by mouth every other day.   vitamin E 1000 UNIT capsule Take 1 capsule (1,000 Units total) by mouth daily.     Review of Systems      All other systems reviewed and are otherwise negative except as noted above.  Physical Exam    VS:  BP (!) 171/83 (BP Location: Right Arm, Patient Position: Sitting, Cuff Size: Large)   Pulse 71   Ht '5\' 8"'$  (1.727 m)   Wt 223 lb 9.6 oz (101.4 kg)   SpO2 99%   BMI 34.00 kg/m  , BMI Body mass index is 34 kg/m.  Wt Readings from Last 3 Encounters:  01/07/22 223 lb 9.6 oz (101.4 kg)  12/10/21 232 lb (105.2 kg)  11/12/21 (P) 229 lb 12.8 oz (104.2 kg)     GEN: Well nourished, overweight, well developed, in no acute distress. HEENT: normal. Neck: Supple, no JVD, carotid bruits, or masses. Cardiac: RRR, no murmurs, rubs, or gallops. No clubbing, cyanosis. Non pitting bilateral pedal edema.  Radials/PT 2+ and equal bilaterally.  Respiratory:  Respirations regular and unlabored, clear to auscultation bilaterally. GI: Soft, nontender, nondistended. MS: No deformity or atrophy. Skin: Warm and dry, no rash. Neuro:  Strength and sensation are intact. Psych:  Normal affect.  Assessment & Plan    HFrEF - 10/2021 echo LVEF 25% despite med titration. PET upcoming 01/14/22 for ischemic evaluation. Weight down 9 pounds since clinic visit one month ago. Nephrology, Dr. Theador Hawthorne, as adjusted diuresis to include Lasix QOD and Spironolactone QOD.  Continue Entresto 97-'103mg'$  BID, Coreg 12.'5mg'$  BID. Low suspicion renal fxn would tolerate SGLT2i. Low sodium diet, fluid restriction <2L, and daily weights encouraged. Educated to contact our office for weight gain of 2 lbs overnight or 5 lbs in one week.   MR - 06/2021 mild to moderate by echo. Not specified on 10/2021 study. PET upcoming later this month to reassess MR, ensure patency of stents.   HTN - BP not at goal <130/80. BP at home 767M-094B systolic.  He is hesitant about additional medications and wishes to discuss with nephrology at visit this week. Encouraged to discuss possible addition of Hydralazine with nephrology. Continue Entresto 97-'103mg'$  BID, Coreg 12.'5mg'$  BID, Lasix '20mg'$  QOD, Spironolactone '25mg'$  QOD. Of note - was taking 97-'103mg'$  and 49-'51mg'$  dose - educated to take only 97-'103mg'$  dose. Anticipate labs at upcoming nephrology visit.   HLD, LDL goal <70 - Continue Zetia, Atorvastatin. Denies myalgias.   CAD - Prior CABG. Given HFrEF ischemic eval upcoming with cardiac PET 12/2021. GDMT includes atorvastatin, coreg, plavix. No aspirin due to allergy. Heart healthy diet and regular cardiovascular exercise encouraged.    CKD IIIb - Careful titration of diuretic and antihypertensive.  Following with nephrology, Dr. Theador Hawthorne at [facility] in [place].   Disposition: Follow up August with Melina Copa, PA in [place] office as scheduled.  Signed, Loel Dubonnet, NP 01/07/2022, 8:26 PM [place]

## 2022-01-07 NOTE — Patient Instructions (Addendum)
Medication Instructions:  Your physician has recommended you make the following change in your medication:  Recommend discussion of adding Hydralazine with your nephrologist this week. Could consider starting with a dose of '25mg'$  or '50mg'$  twice daily.   Be sure you are only taking the Entresto 97-'103mg'$  dose. You do NOT also need the 49-'51mg'$  tablet.  *If you need a refill on your cardiac medications before your next appointment, please call your pharmacy*  Lab Work: None ordered today.   Testing/Procedures: Proceed with your PET scan as scheduled.   Follow-Up: At [facility], you and your health needs are our priority.  As part of our continuing mission to provide you with exceptional heart care, we have created designated Provider Care Teams.  These Care Teams include your primary Cardiologist (physician) and Advanced Practice Providers (APPs -  Physician Assistants and Nurse Practitioners) who all work together to provide you with the care you need, when you need it.  We recommend signing up for the patient portal called "MyChart".  Sign up information is provided on this After Visit Summary.  MyChart is used to connect with patients for Virtual Visits (Telemedicine).  Patients are able to view lab/test results, encounter notes, upcoming appointments, etc.  Non-urgent messages can be sent to your provider as well.   To learn more about what you can do with MyChart, go to [email or web address].    Your next appointment:   As scheduled with Melina Copa, PA in [place] office.    Other Instructions  Heart Healthy Diet Recommendations: A low-salt diet is recommended. Meats should be grilled, baked, or boiled. Avoid fried foods. Focus on lean protein sources like fish or chicken with vegetables and fruits. The American Heart Association is a Microbiologist!  American Heart Association Diet and Lifeystyle Recommendations  Be sure to drink less than 2 liters (64 oz) of fluid per day.    Exercise recommendations: The American Heart Association recommends 150 minutes of moderate intensity exercise weekly. Try 30 minutes of moderate intensity exercise 4-5 times per week. This could include walking, jogging, or swimming.   To prevent or reduce lower extremity swelling: Eat a low salt diet. Salt makes the body hold onto extra fluid which causes swelling. Sit with legs elevated. For example, in the recliner or on an [place].  Wear knee-high compression stockings during the daytime. Ones labeled 15-20 mmHg provide good compression.

## 2022-01-13 ENCOUNTER — Encounter (HOSPITAL_BASED_OUTPATIENT_CLINIC_OR_DEPARTMENT_OTHER): Payer: Self-pay

## 2022-01-13 ENCOUNTER — Telehealth (HOSPITAL_COMMUNITY): Payer: Self-pay | Admitting: *Deleted

## 2022-01-13 NOTE — Telephone Encounter (Signed)
Reaching out to patient to offer assistance regarding upcoming cardiac imaging study; pt verbalizes understanding of appt date/time, parking situation and where to check in, pre-test NPO status and verified current allergies; name and call back number provided for further questions should they arise  Gordy Clement RN Navigator Cardiac Imaging [facility] [phone] office [phone] cell  He is aware to avoid caffeine 12 hours prior to his cardiac PET scan.

## 2022-01-14 ENCOUNTER — Other Ambulatory Visit (HOSPITAL_COMMUNITY): Payer: Self-pay | Admitting: Nephrology

## 2022-01-14 ENCOUNTER — Other Ambulatory Visit: Payer: Self-pay | Admitting: Nephrology

## 2022-01-14 ENCOUNTER — Encounter (HOSPITAL_COMMUNITY)
Admission: RE | Admit: 2022-01-14 | Discharge: 2022-01-14 | Disposition: A | Discharge status: Home / Self Care | Payer: Medicare HMO | Source: Ambulatory Visit | Attending: Physician Assistant | Admitting: Physician Assistant

## 2022-01-14 DIAGNOSIS — I2510 Atherosclerotic heart disease of native coronary artery without angina pectoris: Secondary | ICD-10-CM | POA: Diagnosis present

## 2022-01-14 DIAGNOSIS — N170 Acute kidney failure with tubular necrosis: Secondary | ICD-10-CM

## 2022-01-14 DIAGNOSIS — N1832 Chronic kidney disease, stage 3b: Secondary | ICD-10-CM

## 2022-01-14 DIAGNOSIS — N281 Cyst of kidney, acquired: Secondary | ICD-10-CM

## 2022-01-14 DIAGNOSIS — I5022 Chronic systolic (congestive) heart failure: Secondary | ICD-10-CM | POA: Insufficient documentation

## 2022-01-14 DIAGNOSIS — I129 Hypertensive chronic kidney disease with stage 1 through stage 4 chronic kidney disease, or unspecified chronic kidney disease: Secondary | ICD-10-CM

## 2022-01-14 MED ORDER — RUBIDIUM RB82 GENERATOR (RUBYFILL)
25.0000 | PACK | Freq: Once | INTRAVENOUS | Status: AC
  Administered 2022-01-14: 26.3 via INTRAVENOUS

## 2022-01-14 MED ORDER — REGADENOSON 0.4 MG/5ML IV SOLN
INTRAVENOUS | Status: AC
  Administered 2022-01-14: 0.4 mg via INTRAVENOUS
  Filled 2022-01-14: qty 5

## 2022-01-15 LAB — NM PET CT CARDIAC PERFUSION MULTI W/ABSOLUTE BLOODFLOW
LV dias vol: 206 mL (ref 62–150)
LV sys vol: 145 mL
Nuc Rest EF: 20 %
Nuc Stress EF: 30 %
Peak HR: 74 {beats}/min
Rest HR: 63 {beats}/min
Rest Nuclear Isotope Dose: 26.3 mCi
Rest perfusion cavity size (mL): 175 mL
ST Depression (mm): 0 mm
Stress Nuclear Isotope Dose: 26.3 mCi
Stress perfusion cavity size (mL): 206 mL
TID: 1.06

## 2022-01-17 ENCOUNTER — Telehealth: Payer: Self-pay

## 2022-01-17 NOTE — Telephone Encounter (Signed)
I discussed results with patient.He states his systolic is running in the 130's but he is not interested in adding anymore medication until he speaks with his nephrologist next month. Results copied to pcp.

## 2022-01-17 NOTE — Telephone Encounter (Signed)
-----   Message from Charlie Pitter, [place] sent at 01/17/2022 12:17 PM EDT ----- Please let pt know study was generally consistent with what we know about his heart. His heart muscle is weak with evidence of prior heart attack, decreased blood flow. I reviewed his clinical scenario with Dr. Domenic Polite who suggests to continue to optimize his medicines and keep followup as planned. If any new symptoms develop, should report those to [place]. Per Caitlin's recent those, the patient was recently hesitant to add any new meds without talking to nephrology so let us know if he has since touched base with them. If BP is still running >130 at home, would add hydralazine '25mg'$  TID if patient agreeable and follow-up as planned.  (Documentation only to reference in the future, from Dr. Domenic Polite: "My suggestion would be to continue with optimization of GDMT as tolerated.  Since he has infarct scar with only mild ischemic territory, it seems unlikely that ischemia alone would be the reason for his decline in LVEF and that revascularization would necessarily correct this in and of itself.  We may need to reconsider that depending on how he does clinically, but in the absence of angina and with his present CKD, I would hold off on angiography. ")

## 2022-01-31 ENCOUNTER — Other Ambulatory Visit: Payer: Self-pay | Admitting: Physician Assistant

## 2022-02-17 NOTE — Progress Notes (Unsigned)
Cardiology Office Note    Date:  02/20/2022   ID:  Corey [name], DOB Jul 05, 1935, MRN [id number]  PCP:  [facility]  Cardiologist:  Rozann Lesches, MD  Electrophysiologist:  None   Chief Complaint: f/u CHF  History of Present Illness:   Corey [name] is a 86 y.o. male with history of CAD (s/p BMS to RCA and LCx in 2009, DES to D1 and RCA in 2015, CABG in 09/2019 with LIMA-LAD and SVG-D1), HFrEF, HTN, HLD, CKD stage 3b, aspirin allergy who is seen for follow-up. He has predominantly been followed for medication titration of his LV dysfunction recently. At time of CABG in 2021 his EF was 45-50% pre-operatively. In 05/2021, he began having issues with worsening heart failure. Repeat echo in 06/2021 showed decline to 20-25%, G3DD, mildly reduced RVSP, severe LAE, mild-moderate MR. This prompted medication adjustment though early on in the year the patient was not taking his medications properly. Per Dr. 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 His weight has also varied in the setting of different dietary habits (has been as high as 265, as low as 211 in the last several years). Repeat echo 11/06/21 unfortunately continued to show EF 25% with global HK, dilated IVC. Cardiac PET showed prior MI with peri-infarct ischemia. I had reviewed case with Dr. Domenic Polite who felt it seemed unlikely that ischemia alone would explain his decline in LVEF and given lack of angina + CKD, would hold off angiography and continue medical therapy. He has since established with nephrology as well. Renal US did not visualize a left kidney so he had CT w/o contrast yesterday with result pending.   He is seen for follow-up today overall stable. He reports he is  [facility] though still occasionally has times at home where he'll notice SOB with higher levels of activity. This is not new. He continues to deny any recent chest pain or tightness. Regarding home BP readings he says they vary, can be as low as 112 at times, but usually 150 or lower. He is always higher here in the office. He has chronic LE dependent edema which he states completely resolves when he puts his legs up at night. He denies any recent weight gain (suspect 12/2021 reading may have been typo as it was generally different than other recent readings and he reports stable at home). He also describes a sense of calf tightness at times when [facility]. This does not happen every time. This does not happen at rest.   Labwork independently reviewed: 01/2022 Cr 2.01, K 4.2 (checked by PCP) 11/2021 24h urine protein 720, Mg 1.9, A1c 5.9, Cr 1.73, K 4.3, LFTs ok, BNP 799 10/2021 K 4.3, BUN 29, Cl 113, Cr 1.66, TSH wnl, Hgb 12.8, plt 177, LDL 42, trig 102  Cardiology Studies:   Studies reviewed are outlined and summarized above. Reports included below if pertinent.   Limited echo 10/2021   1. Limited echo.   2. NO significant change in LVEF from echo in Jan 2023.   3. Left ventricular ejection fraction, by estimation, is 25%%. The left  ventricle demonstrates global hypokinesis. There is mild concentric left  ventricular hypertrophy.   4.  Right ventricular systolic function is normal. The right ventricular  size is normal.   5. The inferior vena cava is dilated in size with >50% respiratory  variability, suggesting right atrial pressure of 8 mmHg.     Past Medical History:  Diagnosis Date   Anaphylaxis    Aspirin   Chronic kidney disease, stage 3b ([place])    Coronary atherosclerosis of native coronary artery    a. s/p BMS to RCA and LCx in 2009 b. DES to D1 and RCA in 2015 c. CABG in 09/2019 with LIMA-LAD and SVG-D1   Essential hypertension     HFrEF (heart failure with reduced ejection fraction) ([place])    a. EF 45-50% in 09/2019 b. EF 20-25% in 06/2021 (had been off medications in the interim)   Hyperlipidemia    Myocardial infarction [facility]) 1998   NSTEMI (non-ST elevated myocardial infarction) ([place]) 03/2008   Seasonal allergies     Past Surgical History:  Procedure Laterality Date   CARDIAC CATHETERIZATION     Cath 05/18/2014 DES to D1 and mid RCA   COLONOSCOPY  05/01/2011   Procedure: COLONOSCOPY;  Surgeon: Daneil Dolin, MD;  [facility];  Service: Endoscopy;  Laterality: N/A;  11:30   CORONARY ANGIOPLASTY WITH STENT PLACEMENT  07/1996; 03/2008; 05/18/2014   "?3; ?2; 2"   CORONARY ARTERY BYPASS GRAFT N/A 10/14/2019   Procedure: CORONARY ARTERY BYPASS GRAFTING (CABG) x 2;  Surgeon: Gaye Pollack, MD;  Location: [place];  Service: Open Heart Surgery;  Laterality: N/A;   ENDOVEIN HARVEST OF GREATER SAPHENOUS VEIN Right 10/14/2019   Procedure: [place] Of Greater Saphenous Vein;  Surgeon: Gaye Pollack, MD;  Location: [facility] OR;  Service: Open Heart Surgery;  Laterality: Right;   INTRAVASCULAR PRESSURE WIRE/FFR STUDY N/A 10/10/2019   Procedure: INTRAVASCULAR PRESSURE WIRE/FFR STUDY;  Surgeon: Nelva Bush, MD;  Location: [place] CV LAB;  Service: Cardiovascular;  Laterality: N/A;   LEFT HEART CATH AND CORONARY ANGIOGRAPHY N/A 10/10/2019   Procedure: LEFT HEART CATH AND CORONARY ANGIOGRAPHY;  Surgeon: Nelva Bush, MD;  Location: [place] CV LAB;  Service: Cardiovascular;  Laterality: N/A;   LEFT HEART CATHETERIZATION WITH CORONARY ANGIOGRAM N/A 05/18/2014   Procedure: LEFT HEART CATHETERIZATION WITH CORONARY ANGIOGRAM;  Surgeon: Blane Ohara, MD;  Location: [facility] CATH LAB;  Service: Cardiovascular;  Laterality: N/A;   TEE WITHOUT CARDIOVERSION N/A 10/14/2019   Procedure: TRANSESOPHAGEAL ECHOCARDIOGRAM (TEE);  Surgeon: Gaye Pollack, MD;  Location: [place];  Service: Open Heart Surgery;  Laterality: N/A;     Current Medications: Current Meds  Medication Sig   Ascorbic Acid (VITAMIN C) 1000 MG tablet Take 1,000 mg by mouth daily.   atorvastatin (LIPITOR) 80 MG tablet Take 1 tablet daily at 6 pm.   carvedilol (COREG) 12.5 MG tablet Take 1 tablet (12.5 mg total) by mouth 2 (two) times daily.   Cholecalciferol 50 MCG (2000 UT) CAPS Take by mouth.   clopidogrel (PLAVIX) 75 MG tablet TAKE 1 TABLET BY MOUTH ONCE DAILY - NEED FOLLOW UP FOR REFILLS   ENTRESTO 97-103 MG Take 1 tablet by mouth twice daily   ezetimibe (ZETIA) 10 MG tablet Take 1 tablet by mouth once daily   furosemide (LASIX) 20 MG tablet Take 20 mg by mouth every other day.   hydrALAZINE (APRESOLINE) 50 MG tablet Take 50 mg by mouth 2 (two) times daily.   Omega-3 Fatty Acids (FISH OIL) 1000 MG CAPS Take 1,000 mg by mouth 2 (two)  times daily.   spironolactone (ALDACTONE) 25 MG tablet Take 25 mg by mouth every other day.   vitamin E 1000 UNIT capsule Take 1 capsule (1,000 Units total) by mouth daily.      Allergies:   Aspirin, Other, and Penicillins   Social History   Socioeconomic History   Marital status: Widowed    Spouse name: Not on file   Number of children: Not on file   Years of education: Not on file   Highest education level: Not on file  Occupational History   Occupation: Retired    Comment: Quarry manager  Tobacco Use   Smoking status: Former    Packs/day: 2.00    Years: 45.00    Total pack years: 90.00    Types: Cigarettes    Quit date: 07/31/1996    Years since quitting: 25.5   Smokeless tobacco: Never  Vaping Use   Vaping Use: Never used  Substance and Sexual Activity   Alcohol use: No   Drug use: No   Sexual activity: Not Currently  Other Topics Concern   Not on file  Social History Narrative   Not on file   Social Determinants of Health   Financial Resource Strain: Not on file  Food Insecurity: Not on file  Transportation Needs: Not on file  Physical Activity: Not on file  Stress: Not on  file  Social Connections: Not on file     Family History:  The patient's family history includes Hypertension in an other family member. There is no history of Colon cancer.  ROS:   Please see the history of present illness.  All other systems are reviewed and otherwise negative.    EKG(s)/Additional Labs   EKG:  EKG is not ordered today  Recent Labs: 11/11/2021: TSH 3.041 12/10/2021: BNP 799.3 12/25/2021: ALT 23; Magnesium 1.9 02/18/2022: BUN 27; Creatinine, Ser 2.01; Hemoglobin 12.2; Platelets 171; Potassium 4.2; Sodium 136  Recent Lipid Panel    Component Value Date/Time   CHOL 101 11/11/2021 1108   TRIG 102 11/11/2021 1108   HDL 35 (L) 11/11/2021 1108   CHOLHDL 2.9 11/11/2021 1108   VLDL 20 11/11/2021 1108   LDLCALC 46 11/11/2021 1108   LDLDIRECT 60.8 11/11/2021 1108    PHYSICAL EXAM:    VS:  BP (!) 160/70   Pulse 82   Ht '5\' 8"'$  (1.727 m)   Wt 231 lb 6.4 oz (105 kg)   SpO2 95%   BMI 35.18 kg/m   BMI: Body mass index is 35.18 kg/m.  GEN: Well nourished, well developed male in no acute distress HEENT: normocephalic, atraumatic Neck: no JVD, carotid bruits, or masses Cardiac: RRR; no murmurs, rubs, or gallops, soft BLE primarily ankle/lower shin edema similar to prior Respiratory:  clear to auscultation bilaterally, normal work of breathing GI: soft, nontender, nondistended, + BS MS: no deformity or atrophy Skin: warm and dry, no rash Neuro:  Alert and Oriented x 3, Strength and sensation are intact, follows commands Psych: euthymic mood, full affect  Wt Readings from Last 3 Encounters:  02/20/22 231 lb 6.4 oz (105 kg)  01/07/22 223 lb 9.6 oz (101.4 kg) question accuracy as this was not similar to prior weights  12/10/21 232 lb (105.2 kg)     ASSESSMENT & PLAN:   1. Chronic HFrEF - patient appears clinically stable compared to prior visits. Has NYHA class II-III dyspnea primarily with higher levels of exertion but [facility] without any baseline  dyspnea.  His edema appears similar if not softer than prior. Appreciate nephrology's assistance with diuretic management given his worsening renal function this year compared to prior. Will defer input regarding SGLT2i to nephrology given that Cr has been rather labile the last several months. Otherwise continue present regimen. Discussed compression, elevation of lower extremities given dependent edema. We do need to get a better handle on what his BP is running, see below.  2. Essential HTN - historically his BPs in the office are very high then he reports more controlled if not low readings at times at home. Recommend 24 hour ambulatory BP monitor to assess trends more definitively. Will plan to reach out to nephrology to discuss med regimen adjustments based on results, given recent bloodwork.  3. Mitral regurgitation - noted by echo 09/5407 without explicit commentary by f/u echo in 10/2021 or PET in 12/2021. No significant murmur on exam today. Consider f/u echo 06/2022, can be arranged in follow-up.  4. CAD s/p prior PCI, CABG, HLD - on Plavix due to aspirin allergy. Continue atorvastatin, ezetimibe, carvedilol. No recent angina. As PET was reviewed with Dr. Domenic Polite, continue medical therapy.  5. CKD stage 3b - appreciate nephrology input. They recently did follow-up bloodwork and CT, await their input. Cr has ranged from 1.5-2.1 range this year.  6. Lower extremity discomfort - plan LE ABIs to screen for PAD. Does not happen every day. Has felt fine the last 2 days. No other sx to suggest VTE.    Disposition: F/u with Dr. Domenic Polite in 3-4 months.   Medication Adjustments/Labs and Tests Ordered: Current medicines are reviewed at length with the patient today.  Concerns regarding medicines are outlined above. Medication changes, Labs and Tests ordered today are summarized above and listed in the Patient Instructions accessible in Encounters.    Signed, Charlie Pitter, PA-C  02/20/2022 3:52 PM     [facility] in [place]. [place], [place] [address] Ph: [phone]; Fax [phone]

## 2022-02-18 ENCOUNTER — Other Ambulatory Visit (HOSPITAL_COMMUNITY)
Admission: RE | Admit: 2022-02-18 | Discharge: 2022-02-18 | Disposition: A | Discharge status: Home / Self Care | Payer: Medicare HMO | Source: Ambulatory Visit | Attending: Nephrology | Admitting: Nephrology

## 2022-02-18 DIAGNOSIS — N270 Small kidney, unilateral: Secondary | ICD-10-CM | POA: Insufficient documentation

## 2022-02-18 DIAGNOSIS — R7303 Prediabetes: Secondary | ICD-10-CM | POA: Insufficient documentation

## 2022-02-18 DIAGNOSIS — E538 Deficiency of other specified B group vitamins: Secondary | ICD-10-CM | POA: Insufficient documentation

## 2022-02-18 DIAGNOSIS — N281 Cyst of kidney, acquired: Secondary | ICD-10-CM | POA: Diagnosis present

## 2022-02-18 DIAGNOSIS — R778 Other specified abnormalities of plasma proteins: Secondary | ICD-10-CM | POA: Diagnosis present

## 2022-02-18 DIAGNOSIS — N170 Acute kidney failure with tubular necrosis: Secondary | ICD-10-CM | POA: Diagnosis present

## 2022-02-18 DIAGNOSIS — I5042 Chronic combined systolic (congestive) and diastolic (congestive) heart failure: Secondary | ICD-10-CM | POA: Insufficient documentation

## 2022-02-18 DIAGNOSIS — E559 Vitamin D deficiency, unspecified: Secondary | ICD-10-CM | POA: Insufficient documentation

## 2022-02-18 DIAGNOSIS — N1832 Chronic kidney disease, stage 3b: Secondary | ICD-10-CM | POA: Insufficient documentation

## 2022-02-18 DIAGNOSIS — I129 Hypertensive chronic kidney disease with stage 1 through stage 4 chronic kidney disease, or unspecified chronic kidney disease: Secondary | ICD-10-CM | POA: Insufficient documentation

## 2022-02-18 LAB — CBC
HCT: 37 % — ABNORMAL LOW (ref 39.0–52.0)
Hemoglobin: 12.2 g/dL — ABNORMAL LOW (ref 13.0–17.0)
MCH: 27.4 pg (ref 26.0–34.0)
MCHC: 33 g/dL (ref 30.0–36.0)
MCV: 83.1 fL (ref 80.0–100.0)
Platelets: 171 10*3/uL (ref 150–400)
RBC: 4.45 MIL/uL (ref 4.22–5.81)
RDW: 16.2 % — ABNORMAL HIGH (ref 11.5–15.5)
WBC: 5.8 10*3/uL (ref 4.0–10.5)
nRBC: 0 % (ref 0.0–0.2)

## 2022-02-18 LAB — VITAMIN B12: Vitamin B-12: 673 pg/mL (ref 180–914)

## 2022-02-18 LAB — RENAL FUNCTION PANEL
Albumin: 3.5 g/dL (ref 3.5–5.0)
Anion gap: 6 (ref 5–15)
BUN: 27 mg/dL — ABNORMAL HIGH (ref 8–23)
CO2: 22 mmol/L (ref 22–32)
Calcium: 8.7 mg/dL — ABNORMAL LOW (ref 8.9–10.3)
Chloride: 108 mmol/L (ref 98–111)
Creatinine, Ser: 2.01 mg/dL — ABNORMAL HIGH (ref 0.61–1.24)
GFR, Estimated: 32 mL/min — ABNORMAL LOW (ref >60)
Glucose, Bld: 107 mg/dL — ABNORMAL HIGH (ref 70–99)
Phosphorus: 3.6 mg/dL (ref 2.5–4.6)
Potassium: 4.2 mmol/L (ref 3.5–5.1)
Sodium: 136 mmol/L (ref 135–145)

## 2022-02-18 LAB — PROTEIN / CREATININE RATIO, URINE
Creatinine, Urine: 168.84 mg/dL
Protein Creatinine Ratio: 0.29 mg/mg{Cre} — ABNORMAL HIGH (ref 0.00–0.15)
Total Protein, Urine: 49 mg/dL

## 2022-02-19 ENCOUNTER — Ambulatory Visit (HOSPITAL_COMMUNITY)
Admission: RE | Admit: 2022-02-19 | Discharge: 2022-02-19 | Disposition: A | Discharge status: Home / Self Care | Payer: Medicare HMO | Source: Ambulatory Visit | Attending: Nephrology | Admitting: Nephrology

## 2022-02-19 DIAGNOSIS — N170 Acute kidney failure with tubular necrosis: Secondary | ICD-10-CM | POA: Insufficient documentation

## 2022-02-19 DIAGNOSIS — N281 Cyst of kidney, acquired: Secondary | ICD-10-CM | POA: Diagnosis present

## 2022-02-19 DIAGNOSIS — I129 Hypertensive chronic kidney disease with stage 1 through stage 4 chronic kidney disease, or unspecified chronic kidney disease: Secondary | ICD-10-CM | POA: Diagnosis present

## 2022-02-19 DIAGNOSIS — N1832 Chronic kidney disease, stage 3b: Secondary | ICD-10-CM | POA: Insufficient documentation

## 2022-02-20 ENCOUNTER — Encounter: Payer: Self-pay | Admitting: Physician Assistant

## 2022-02-20 ENCOUNTER — Ambulatory Visit: Payer: Medicare HMO | Admitting: Physician Assistant

## 2022-02-20 VITALS — BP 160/70 | HR 82 | Ht 68.0 in | Wt 231.4 lb

## 2022-02-20 DIAGNOSIS — I340 Nonrheumatic mitral (valve) insufficiency: Secondary | ICD-10-CM | POA: Diagnosis not present

## 2022-02-20 DIAGNOSIS — E785 Hyperlipidemia, unspecified: Secondary | ICD-10-CM

## 2022-02-20 DIAGNOSIS — I2510 Atherosclerotic heart disease of native coronary artery without angina pectoris: Secondary | ICD-10-CM

## 2022-02-20 DIAGNOSIS — I5022 Chronic systolic (congestive) heart failure: Secondary | ICD-10-CM

## 2022-02-20 DIAGNOSIS — N1832 Chronic kidney disease, stage 3b: Secondary | ICD-10-CM

## 2022-02-20 DIAGNOSIS — I10 Essential (primary) hypertension: Secondary | ICD-10-CM | POA: Diagnosis not present

## 2022-02-20 DIAGNOSIS — M79604 Pain in right leg: Secondary | ICD-10-CM

## 2022-02-20 DIAGNOSIS — M79605 Pain in left leg: Secondary | ICD-10-CM

## 2022-02-20 NOTE — Patient Instructions (Signed)
Medication Instructions:  Your physician recommends that you continue on your current medications as directed. Please refer to the Current Medication list given to you today.   Labwork: None  Testing/Procedures: Your physician has requested that you have an ankle brachial index (ABI). During this test an ultrasound and blood pressure cuff are used to evaluate the arteries that supply the arms and legs with blood. Allow thirty minutes for this exam. There are no restrictions or special instructions.   Follow-Up: Follow up with Dr. Domenic Polite in 6 months.   Any Other Special Instructions Will Be Listed Below (If Applicable).     If you need a refill on your cardiac medications before your next appointment, please call your pharmacy.

## 2022-02-21 ENCOUNTER — Other Ambulatory Visit: Payer: Self-pay | Admitting: *Deleted

## 2022-02-21 DIAGNOSIS — R030 Elevated blood-pressure reading, without diagnosis of hypertension: Secondary | ICD-10-CM

## 2022-02-27 ENCOUNTER — Telehealth: Payer: Self-pay

## 2022-02-27 ENCOUNTER — Ambulatory Visit (HOSPITAL_COMMUNITY)
Admission: RE | Admit: 2022-02-27 | Discharge: 2022-02-27 | Disposition: A | Discharge status: Home / Self Care | Payer: Medicare HMO | Source: Ambulatory Visit | Attending: Physician Assistant | Admitting: Physician Assistant

## 2022-02-27 DIAGNOSIS — M79605 Pain in left leg: Secondary | ICD-10-CM | POA: Insufficient documentation

## 2022-02-27 DIAGNOSIS — R6889 Other general symptoms and signs: Secondary | ICD-10-CM

## 2022-02-27 DIAGNOSIS — M79604 Pain in right leg: Secondary | ICD-10-CM | POA: Diagnosis present

## 2022-02-27 NOTE — Telephone Encounter (Signed)
-----   Message from Charlie Pitter, [place] sent at 02/27/2022 12:53 PM EDT ----- Please let pt know LE ABIs are suggestive of significant vascular disease in his legs. Recommend lower extremity arterial duplex and referral to one of our PV specialists, Dr. Fletcher Anon or Dr. Gwenlyn Found in our group

## 2022-02-27 NOTE — Telephone Encounter (Signed)
I spoke with patient and discussed ABI results. We will schedule LE arterial doppler at the [facility] office. Referral placed for PV consult with dr.Berry or [facility].

## 2022-03-04 ENCOUNTER — Ambulatory Visit: Payer: Medicare HMO | Attending: Physician Assistant

## 2022-03-04 DIAGNOSIS — I739 Peripheral vascular disease, unspecified: Secondary | ICD-10-CM | POA: Diagnosis not present

## 2022-03-04 DIAGNOSIS — R6889 Other general symptoms and signs: Secondary | ICD-10-CM

## 2022-03-10 ENCOUNTER — Telehealth: Payer: Self-pay

## 2022-03-10 NOTE — Telephone Encounter (Signed)
-----   Message from Charlie Pitter, [place] sent at 03/10/2022  9:54 AM EDT ----- [facility] PV duplex returned with addendum that reading provider suggests we also obtain an Aorto-iliac study (this is a duplex but just in the aortoiliac system). thanks

## 2022-03-11 ENCOUNTER — Encounter: Payer: Self-pay | Admitting: Cardiovascular Disease

## 2022-03-11 ENCOUNTER — Other Ambulatory Visit: Payer: Self-pay

## 2022-03-11 ENCOUNTER — Ambulatory Visit: Payer: Medicare HMO | Attending: Cardiovascular Disease | Admitting: Cardiovascular Disease

## 2022-03-11 DIAGNOSIS — I739 Peripheral vascular disease, unspecified: Secondary | ICD-10-CM | POA: Diagnosis not present

## 2022-03-11 DIAGNOSIS — R6889 Other general symptoms and signs: Secondary | ICD-10-CM

## 2022-03-11 MED ORDER — CILOSTAZOL 50 MG PO TABS: 50.0000 mg | ORAL_TABLET | Freq: Two times a day (BID) | ORAL | 1 refills | Status: DC

## 2022-03-11 NOTE — Patient Instructions (Signed)
Medication Instructions:   -Start taking cilostazol (pletal) '50mg'$  twice daily.  *If you need a refill on your cardiac medications before your next appointment, please call your pharmacy*   Testing/Procedures: Dr. Gwenlyn Found has recommended that you have an Ultrasound of your AORTA/IVC/ILIACS.   To prepare for this test:  No food after 11PM the night before. Water is OK. (Don't drink liquids if you have been instructed not to for ANOTHER test).  Avoid foods that produce bowel gas, for 24 hours prior to exam (see below). No breakfast, no chewing gum, no smoking or carbonated beverages. Patient may take morning medications with water. Come in for test at least 15 minutes early to register.    Follow-Up: At [facility], you and your health needs are our priority.  As part of our continuing mission to provide you with exceptional heart care, we have created designated Provider Care Teams.  These Care Teams include your primary Cardiologist (physician) and Advanced Practice Providers (APPs -  Physician Assistants and Nurse Practitioners) who all work together to provide you with the care you need, when you need it.  We recommend signing up for the patient portal called "MyChart".  Sign up information is provided on this After Visit Summary.  MyChart is used to connect with patients for Virtual Visits (Telemedicine).  Patients are able to view lab/test results, encounter notes, upcoming appointments, etc.  Non-urgent messages can be sent to your provider as well.   To learn more about what you can do with MyChart, go to [email or web address].    Your next appointment:   3 month(s)  The format for your next appointment:   In Person  Provider:   Quay Burow, MD

## 2022-03-11 NOTE — Progress Notes (Signed)
03/11/2022 Corey [name]   1935-07-13  [id number]  Primary Physician Corey School, MD Primary Cardiologist: Corey Harp MD Corey [name], [place]  HPI:  Corey [name] is a 86 y.o. moderately overweight widowed African-American male father of 43, grandfather of 5 grandchildren who is accompanied by his daughter Corey [name] today.  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  He was referred by Corey Copa, PA-C for evaluation of PAD.  He is a cardiology patient of Dr. Myles [name].  He has extensive cardiac history dating back to 1998 when he had a myocardial infarction.  He had multiple percutaneous interventions in the past and recently had CABG 09/2019.  Other problems include treated hypertension hyperlipidemia.  He works out at [employer] 5 days a week and is noticed discomfort in his calves when walking on the treadmill and up an incline.  Recent Doppler studies performed 03/04/2022 revealed a right ABI of 0.48, left of 0.52 with monophasic waveforms in his SFA suggesting more proximal disease.  He does complain of some dyspnea but denies chest pain.   Current Meds  Medication Sig   Ascorbic Acid (VITAMIN C) 1000 MG tablet Take 1,000 mg by mouth daily.   atorvastatin (LIPITOR) 80 MG tablet Take 1 tablet daily at 6 pm.   carvedilol (COREG) 12.5 MG tablet Take 1 tablet (12.5 mg total) by mouth 2 (two) times daily.   Cholecalciferol 50 MCG (2000 UT) CAPS Take by mouth.   cilostazol (PLETAL) 50 MG tablet Take 1 tablet (50 mg total) by mouth 2 (two) times daily.   clopidogrel (PLAVIX) 75 MG tablet TAKE 1 TABLET BY MOUTH ONCE DAILY - NEED FOLLOW UP FOR REFILLS   ENTRESTO 97-103 MG Take 1 tablet by mouth twice daily   ezetimibe (ZETIA) 10 MG tablet Take 1 tablet by mouth once daily   furosemide (LASIX) 20 MG tablet Take 20 mg by mouth every other day.   hydrALAZINE (APRESOLINE) 50 MG tablet Take 50 mg by mouth 2 (two) times daily.   Omega-3 Fatty Acids (FISH OIL) 1000  MG CAPS Take 1,000 mg by mouth 2 (two) times daily.   spironolactone (ALDACTONE) 25 MG tablet Take 25 mg by mouth every other day.   vitamin E 1000 UNIT capsule Take 1 capsule (1,000 Units total) by mouth daily.     Allergies  Allergen Reactions   Aspirin Anaphylaxis   Other     Foods that are high in acid content cause sores and a rash.    Penicillins Nausea And Vomiting    Social History   Socioeconomic History   Marital status: Widowed    Spouse name: Not on file   Number of children: Not on file   Years of education: Not on file   Highest education level: Not on file  Occupational History   Occupation: Retired    Comment: Quarry manager  Tobacco Use   Smoking status: Former    Packs/day: 2.00    Years: 45.00    Total pack years: 90.00    Types: Cigarettes    Quit date: 07/31/1996    Years since quitting: 25.6   Smokeless tobacco: Never  Vaping Use   Vaping Use: Never used  Substance and Sexual Activity   Alcohol use: No   Drug use: No   Sexual activity: Not Currently  Other Topics Concern   Not on file  Social History Narrative   Not on file   Social Determinants  of Health   Financial Resource Strain: Not on file  Food Insecurity: Not on file  Transportation Needs: Not on file  Physical Activity: Not on file  Stress: Not on file  Social Connections: Not on file  Intimate Partner Violence: Not on file     Review of Systems: General: negative for chills, fever, night sweats or weight changes.  Cardiovascular: negative for chest pain, dyspnea on exertion, edema, orthopnea, palpitations, paroxysmal nocturnal dyspnea or shortness of breath Dermatological: negative for rash Respiratory: negative for cough or wheezing Urologic: negative for hematuria Abdominal: negative for nausea, vomiting, diarrhea, bright red blood per rectum, melena, or hematemesis Neurologic: negative for visual changes, syncope, or dizziness All other systems reviewed and are otherwise  negative except as noted above.    Blood pressure (!) 142/60, pulse 68, height '5\' 8"'$  (1.727 m), weight 230 lb (104.3 kg), SpO2 95 %.  General appearance: alert and no distress Neck: no adenopathy, no carotid bruit, no JVD, supple, symmetrical, trachea midline, and thyroid not enlarged, symmetric, no tenderness/mass/nodules Lungs: clear to auscultation bilaterally Heart: regular rate and rhythm, S1, S2 normal, no murmur, click, rub or gallop Extremities: extremities normal, atraumatic, no cyanosis or edema Pulses: Diminished pedal pulses Skin: Skin color, texture, turgor normal. No rashes or lesions Neurologic: Grossly normal  EKG not performed today  ASSESSMENT AND PLAN:   Peripheral arterial disease [facility]) Mr. Corey [name]  again was referred to me by Corey Copa PA-C because of claudication.  He is a patient of Dr. Myles [name].  He has extensive history of CAD and multiple cardiac risk factors.  Over the last several months he has noticed pain in his calves when he walks on a treadmill or up an incline which he attributes to recent change in medications.  Recent Doppler studies performed 03/04/2022 revealed a right ABI of 0 0.48 and a left ABI of 0.52 with monophasic waveforms this SFAs and below suggesting more proximal disease.  I am going to get aortoiliac Dopplers and place him on Pletal 50 mg p.o. twice daily.  We will reassess in 3 months.     Corey Harp MD FACP,FACC,FAHA, [facility] 03/11/2022 11:08 AM

## 2022-03-11 NOTE — Assessment & Plan Note (Signed)
Corey [name]  again was referred to me by Melina Copa PA-C because of claudication.  He is a patient of Dr. Myles Gip.  He has extensive history of CAD and multiple cardiac risk factors.  Over the last several months he has noticed pain in his calves when he walks on a treadmill or up an incline which he attributes to recent change in medications.  Recent Doppler studies performed 03/04/2022 revealed a right ABI of 0 0.48 and a left ABI of 0.52 with monophasic waveforms this SFAs and below suggesting more proximal disease.  I am going to get aortoiliac Dopplers and place him on Pletal 50 mg p.o. twice daily.  We will reassess in 3 months.

## 2022-03-17 ENCOUNTER — Ambulatory Visit (HOSPITAL_COMMUNITY)
Admission: RE | Admit: 2022-03-17 | Discharge: 2022-03-17 | Disposition: A | Discharge status: Home / Self Care | Payer: Medicare HMO | Source: Ambulatory Visit | Attending: Physician Assistant | Admitting: Physician Assistant

## 2022-03-17 DIAGNOSIS — Z136 Encounter for screening for cardiovascular disorders: Secondary | ICD-10-CM | POA: Insufficient documentation

## 2022-03-17 DIAGNOSIS — I708 Atherosclerosis of other arteries: Secondary | ICD-10-CM | POA: Diagnosis not present

## 2022-03-17 DIAGNOSIS — R6889 Other general symptoms and signs: Secondary | ICD-10-CM | POA: Diagnosis present

## 2022-03-17 DIAGNOSIS — I739 Peripheral vascular disease, unspecified: Secondary | ICD-10-CM | POA: Diagnosis not present

## 2022-04-02 ENCOUNTER — Ambulatory Visit: Payer: Medicare HMO | Attending: Cardiovascular Disease | Admitting: Cardiovascular Disease

## 2022-04-02 ENCOUNTER — Encounter: Payer: Self-pay | Admitting: Cardiovascular Disease

## 2022-04-02 VITALS — BP 140/68 | HR 68 | Ht 68.0 in | Wt 227.0 lb

## 2022-04-02 DIAGNOSIS — I739 Peripheral vascular disease, unspecified: Secondary | ICD-10-CM | POA: Diagnosis not present

## 2022-04-02 NOTE — Progress Notes (Signed)
Mr. Dobbs returns today for follow-up of his Doppler studies.  I placed him on Pletal which resulted in marked improvement in his claudication.  I did get aortoiliac Doppler studies that suggested a distal right common iliac artery stenosis.  Left side appeared widely patent.  He does have a history of a small left kidney with suggestion of calcification of the ostium of the renal artery suggesting this may be caused by renal artery stenosis.  He is on 4 antihypertensive medications and is seeing a nephrologist.  His serum creatinine runs in the 2.0 range.  He may benefit from renal intervention for renal vessel hypertension and renal preservation.  I am happy to see the patient for this if his nephrologist suggest this.  Otherwise, I will see him back in 6 months for follow-up.  Lorretta Harp, M.D., [place], [facility], [place] [address]. [place], [place]  [address]  [phone] 04/02/2022 12:29 PM

## 2022-04-02 NOTE — Patient Instructions (Signed)
Medication Instructions:  Your physician recommends that you continue on your current medications as directed. Please refer to the Current Medication list given to you today.  *If you need a refill on your cardiac medications before your next appointment, please call your pharmacy*   Follow-Up: At [facility], you and your health needs are our priority.  As part of our continuing mission to provide you with exceptional heart care, we have created designated Provider Care Teams.  These Care Teams include your primary Cardiologist (physician) and Advanced Practice Providers (APPs -  Physician Assistants and Nurse Practitioners) who all work together to provide you with the care you need, when you need it.  We recommend signing up for the patient portal called "MyChart".  Sign up information is provided on this After Visit Summary.  MyChart is used to connect with patients for Virtual Visits (Telemedicine).  Patients are able to view lab/test results, encounter notes, upcoming appointments, etc.  Non-urgent messages can be sent to your provider as well.   To learn more about what you can do with MyChart, go to [email or web address].    Your next appointment:   6 month(s)  The format for your next appointment:   In Person  Provider:   Jonathan Berry, MD  

## 2022-04-07 ENCOUNTER — Ambulatory Visit (HOSPITAL_COMMUNITY)
Admission: RE | Admit: 2022-04-07 | Discharge: 2022-04-07 | Disposition: A | Discharge status: Home / Self Care | Payer: Medicare HMO | Source: Ambulatory Visit | Attending: Family Medicine | Admitting: Family Medicine

## 2022-04-07 ENCOUNTER — Ambulatory Visit: Payer: Medicare HMO | Attending: Physician Assistant

## 2022-04-07 ENCOUNTER — Other Ambulatory Visit (HOSPITAL_COMMUNITY): Payer: Self-pay | Admitting: Family Medicine

## 2022-04-07 DIAGNOSIS — R030 Elevated blood-pressure reading, without diagnosis of hypertension: Secondary | ICD-10-CM

## 2022-04-07 DIAGNOSIS — M25571 Pain in right ankle and joints of right foot: Secondary | ICD-10-CM | POA: Diagnosis present

## 2022-04-07 NOTE — Progress Notes (Unsigned)
24 Hour ambulatory blood pressure monitor applied to patients right arm using adult large cuff.  Dr. Domenic Polite to read.

## 2022-04-10 ENCOUNTER — Telehealth: Payer: Self-pay

## 2022-04-10 ENCOUNTER — Telehealth: Payer: Self-pay | Admitting: Physician Assistant

## 2022-04-10 NOTE — Telephone Encounter (Signed)
Spoke with Dr. Theador Hawthorne. He would not pursue renal artery intervention given the size of the small kidney, states there would not specifically be anything to save. Discussed recent BP trend. Given his age, he would not recommend more aggressive BP lowering. Appreciate nephrology input. Will relay this message to Dr. Gwenlyn Found so he is aware that Dr. Theador Hawthorne does not feel the patient would benefit from renal angio.   Will also route to our [place] triage team to let the patient know his average BPs have been ranging <795 systolic and to continue present regimen. If he begins to see readings higher than this on a regular basis, would reach out to our office.

## 2022-04-10 NOTE — Telephone Encounter (Signed)
Patient notified and verbalized understanding. Will wait for Dr. Kennon Holter input.

## 2022-04-10 NOTE — Telephone Encounter (Signed)
Thayer Headings states Dr. Theador Hawthorne is requesting to speak with Dayna.

## 2022-04-10 NOTE — Telephone Encounter (Signed)
-----   Message from Charlie Pitter, [place] sent at 04/09/2022 11:24 AM EDT ----- Please let pt know his average blood pressure was 138/61, approximately almost half of his readings were higher than 140 while awake. The patient had previously wanted his nephrologist to help guide blood pressure medicine changes. Additionally he recently saw Dr. Gwenlyn Found who stated in his note, "  He may benefit from renal intervention for renal vessel hypertension and renal preservation.  I am happy to see the patient for this if his nephrologist suggest this." Can you please place a call to patient's nephrologist Dr. Theador Hawthorne to review the elevated BP + Dr. Kennon Holter inquiry and advise further? Thank you.

## 2022-04-10 NOTE — Telephone Encounter (Signed)
Patient notified and verbalized understanding. Patient had no questions or concerns at this time. Note sent to Dr. Toya Smothers office with a call placed to review.

## 2022-04-11 NOTE — Telephone Encounter (Signed)
This was fyi only to Dr. Gwenlyn Found, no further action needed

## 2022-04-25 LAB — UIFE/LIGHT CHAINS/TP QN, 24-HR UR
FR KAPPA LT CH,24HR: 202.28 mg/24 hr
FR LAMBDA LT CH,24HR: 35.06 mg/24 hr
Free Kappa Lt Chains,Ur: 89.9 mg/L — ABNORMAL HIGH (ref 1.17–86.46)
Free Kappa/Lambda Ratio: 5.77 (ref 1.83–14.26)
Free Lambda Lt Chains,Ur: 15.58 mg/L — ABNORMAL HIGH (ref 0.27–15.21)
Total Protein, Urine-Ur/day: 767 mg/24 hr — ABNORMAL HIGH (ref 30–150)
Total Protein, Urine: 34.1 mg/dL

## 2022-04-29 ENCOUNTER — Ambulatory Visit (INDEPENDENT_AMBULATORY_CARE_PROVIDER_SITE_OTHER): Payer: Medicare HMO | Admitting: Orthopaedic Surgery

## 2022-04-29 ENCOUNTER — Encounter: Payer: Self-pay | Admitting: Orthopaedic Surgery

## 2022-04-29 VITALS — BP 148/64 | HR 70 | Ht 68.0 in | Wt 227.0 lb

## 2022-04-29 DIAGNOSIS — S96911A Strain of unspecified muscle and tendon at ankle and foot level, right foot, initial encounter: Secondary | ICD-10-CM

## 2022-04-29 NOTE — Progress Notes (Signed)
Subjective:    Patient ID: Corey [name], male    DOB: 1936/04/01, 86 y.o.   MRN: [id number]  HPI He took a misstep and hurt his right ankle about three weeks ago.  He has been seen at [facility].  I have reviewed the notes and the X-rays.  He continues to have pain of the right ankle with lateral swelling.  He has edema of both ankles at times secondary to heart problems.  He is on a fluid pill.  The right ankle remains much more swollen.  He has no redness.  He is using a walker.  I have independently reviewed and interpreted x-rays of this patient done at another site by another physician or qualified health professional.    Review of Systems  Constitutional:  Positive for activity change.  Respiratory:  Positive for shortness of breath.   Cardiovascular:  Positive for chest pain.  Musculoskeletal:  Positive for arthralgias, gait problem and joint swelling.  All other systems reviewed and are negative. For Review of Systems, all other systems reviewed and are negative.  The following is a summary of the past history medically, past history surgically, known current medicines, social history and family history.  This information is gathered electronically by the computer from prior information and documentation.  I review this each visit and have found including this information at this point in the chart is beneficial and informative.   Past Medical History:  Diagnosis Date   Anaphylaxis    Aspirin   Chronic kidney disease, stage 3b ([place])    Coronary atherosclerosis of native coronary artery    a. s/p BMS to RCA and LCx in 2009 b. DES to D1 and RCA in 2015 c. CABG in 09/2019 with LIMA-LAD and SVG-D1   Essential hypertension    HFrEF (heart failure with reduced ejection fraction) ([place])    a. EF 45-50% in 09/2019 b. EF 20-25% in 06/2021 (had been off medications in the interim)   Hyperlipidemia    Myocardial infarction [facility]) 1998   NSTEMI (non-ST elevated myocardial  infarction) ([place]) 03/2008   Seasonal allergies     Past Surgical History:  Procedure Laterality Date   CARDIAC CATHETERIZATION     Cath 05/18/2014 DES to D1 and mid RCA   COLONOSCOPY  05/01/2011   Procedure: COLONOSCOPY;  Surgeon: Daneil Dolin, MD;  [facility];  Service: Endoscopy;  Laterality: N/A;  11:30   CORONARY ANGIOPLASTY WITH STENT PLACEMENT  07/1996; 03/2008; 05/18/2014   "?3; ?2; 2"   CORONARY ARTERY BYPASS GRAFT N/A 10/14/2019   Procedure: CORONARY ARTERY BYPASS GRAFTING (CABG) x 2;  Surgeon: Gaye Pollack, MD;  Location: [place];  Service: Open Heart Surgery;  Laterality: N/A;   ENDOVEIN HARVEST OF GREATER SAPHENOUS VEIN Right 10/14/2019   Procedure: [place] Of Greater Saphenous Vein;  Surgeon: Gaye Pollack, MD;  Location: [facility];  Service: Open Heart Surgery;  Laterality: Right;   INTRAVASCULAR PRESSURE WIRE/FFR STUDY N/A 10/10/2019   Procedure: INTRAVASCULAR PRESSURE WIRE/FFR STUDY;  Surgeon: Nelva Bush, MD;  Location: [place] CV LAB;  Service: Cardiovascular;  Laterality: N/A;   LEFT HEART CATH AND CORONARY ANGIOGRAPHY N/A 10/10/2019   Procedure: LEFT HEART CATH AND CORONARY ANGIOGRAPHY;  Surgeon: Nelva Bush, MD;  Location: [place] CV LAB;  Service: Cardiovascular;  Laterality: N/A;   LEFT HEART CATHETERIZATION WITH CORONARY ANGIOGRAM N/A 05/18/2014   Procedure: LEFT HEART CATHETERIZATION WITH CORONARY ANGIOGRAM;  Surgeon: Blane Ohara,  MD;  Location: [place] CATH LAB;  Service: Cardiovascular;  Laterality: N/A;   TEE WITHOUT CARDIOVERSION N/A 10/14/2019   Procedure: TRANSESOPHAGEAL ECHOCARDIOGRAM (TEE);  Surgeon: Gaye Pollack, MD;  Location: [place];  Service: Open Heart Surgery;  Laterality: N/A;    Current Outpatient Medications on File Prior to Visit  Medication Sig Dispense Refill   Ascorbic Acid (VITAMIN C) 1000 MG tablet Take 1,000 mg by mouth daily.     atorvastatin (LIPITOR) 80 MG tablet Take 1 tablet daily at 6 pm. 90 tablet  3   carvedilol (COREG) 12.5 MG tablet Take 1 tablet (12.5 mg total) by mouth 2 (two) times daily. 180 tablet 3   Cholecalciferol 50 MCG (2000 UT) CAPS Take by mouth.     cilostazol (PLETAL) 50 MG tablet Take 1 tablet (50 mg total) by mouth 2 (two) times daily. 180 tablet 1   clopidogrel (PLAVIX) 75 MG tablet TAKE 1 TABLET BY MOUTH ONCE DAILY - NEED FOLLOW UP FOR REFILLS 90 tablet 3   ENTRESTO 97-103 MG Take 1 tablet by mouth twice daily 180 tablet 1   ezetimibe (ZETIA) 10 MG tablet Take 1 tablet by mouth once daily 90 tablet 1   furosemide (LASIX) 20 MG tablet Take 20 mg by mouth every other day.     hydrALAZINE (APRESOLINE) 50 MG tablet Take 50 mg by mouth 2 (two) times daily.     Omega-3 Fatty Acids (FISH OIL) 1000 MG CAPS Take 1,000 mg by mouth 2 (two) times daily.     spironolactone (ALDACTONE) 25 MG tablet Take 25 mg by mouth every other day.     vitamin E 1000 UNIT capsule Take 1 capsule (1,000 Units total) by mouth daily.     No current facility-administered medications on file prior to visit.    Social History   Socioeconomic History   Marital status: Widowed    Spouse name: Not on file   Number of children: Not on file   Years of education: Not on file   Highest education level: Not on file  Occupational History   Occupation: Retired    Comment: Quarry manager  Tobacco Use   Smoking status: Former    Packs/day: 2.00    Years: 45.00    Total pack years: 90.00    Types: Cigarettes    Quit date: 07/31/1996    Years since quitting: 25.7   Smokeless tobacco: Never  Vaping Use   Vaping Use: Never used  Substance and Sexual Activity   Alcohol use: No   Drug use: No   Sexual activity: Not Currently  Other Topics Concern   Not on file  Social History Narrative   Not on file   Social Determinants of Health   Financial Resource Strain: Not on file  Food Insecurity: Not on file  Transportation Needs: Not on file  Physical Activity: Not on file  Stress: Not on file   Social Connections: Not on file  Intimate Partner Violence: Not on file    Family History  Problem Relation Age of Onset   Hypertension Other    Colon cancer Neg Hx     BP (!) 148/64   Pulse 70   Ht '5\' 8"'$  (1.727 m)   Wt 227 lb (103 kg)   BMI 34.52 kg/m   Body mass index is 34.52 kg/m.      Objective:   Physical Exam Vitals and nursing note reviewed. Exam conducted with a chaperone present.  Constitutional:  Appearance: He is well-developed.  HENT:     Head: Normocephalic and atraumatic.  Eyes:     Conjunctiva/sclera: Conjunctivae normal.     Pupils: Pupils are equal, round, and reactive to light.  Cardiovascular:     Rate and Rhythm: Normal rate and regular rhythm.  Pulmonary:     Effort: Pulmonary effort is normal.  Abdominal:     Palpations: Abdomen is soft.  Musculoskeletal:     Cervical back: Normal range of motion and neck supple.       Legs:  Skin:    General: Skin is warm and dry.  Neurological:     Mental Status: He is alert and oriented to person, place, and time.     Cranial Nerves: No cranial nerve deficit.     Motor: No abnormal muscle tone.     Coordination: Coordination normal.     Deep Tendon Reflexes: Reflexes are normal and symmetric. Reflexes normal.  Psychiatric:        Behavior: Behavior normal.        Thought Content: Thought content normal.        Judgment: Judgment normal.           Assessment & Plan:   Encounter Diagnosis  Name Primary?   Strain of right ankle, initial encounter Yes   I have given sheet of instructions for contrast baths.  I have given CAM walker.  Return in two weeks.  Use walker as needed.  Call if any problem.  Precautions discussed.  Electronically Signed Sanjuana Kava, MD 10/31/202310:31 AM

## 2022-05-11 ENCOUNTER — Other Ambulatory Visit: Payer: Self-pay | Admitting: Cardiology

## 2022-05-13 ENCOUNTER — Ambulatory Visit: Payer: Medicare HMO | Admitting: Orthopaedic Surgery

## 2022-05-13 ENCOUNTER — Encounter: Payer: Self-pay | Admitting: Orthopaedic Surgery

## 2022-05-13 VITALS — BP 148/59 | HR 67 | Ht 68.0 in | Wt 250.0 lb

## 2022-05-13 DIAGNOSIS — S96911D Strain of unspecified muscle and tendon at ankle and foot level, right foot, subsequent encounter: Secondary | ICD-10-CM | POA: Diagnosis not present

## 2022-05-13 NOTE — Progress Notes (Signed)
I am better.  He has been using the CAM walker and the right ankle pain is less.  He has less swelling.  He is walking better.  Right ankle is less painful, less swollen but still has some lateral swelling. ROM is full.  NV intact.  Encounter Diagnosis  Name Primary?   Strain of right ankle, subsequent encounter Yes   Gradually come out of the CAM walker.  Return in three weeks.  Call if any problem.  Precautions discussed.  Electronically Signed Sanjuana Kava, MD 11/14/202310:42 AM

## 2022-05-13 NOTE — Patient Instructions (Addendum)
You can start coming out of your boot slowly.   Have a very blessed Thanksgiving!!

## 2022-05-26 ENCOUNTER — Other Ambulatory Visit (HOSPITAL_COMMUNITY)
Admission: RE | Admit: 2022-05-26 | Discharge: 2022-05-26 | Disposition: A | Discharge status: Home / Self Care | Payer: Medicare HMO | Source: Ambulatory Visit | Attending: Nephrology | Admitting: Nephrology

## 2022-05-26 DIAGNOSIS — N170 Acute kidney failure with tubular necrosis: Secondary | ICD-10-CM | POA: Diagnosis not present

## 2022-05-26 DIAGNOSIS — R809 Proteinuria, unspecified: Secondary | ICD-10-CM | POA: Insufficient documentation

## 2022-05-26 DIAGNOSIS — R9341 Abnormal radiologic findings on diagnostic imaging of renal pelvis, ureter, or bladder: Secondary | ICD-10-CM | POA: Diagnosis not present

## 2022-05-26 DIAGNOSIS — N280 Ischemia and infarction of kidney: Secondary | ICD-10-CM | POA: Diagnosis not present

## 2022-05-26 DIAGNOSIS — D638 Anemia in other chronic diseases classified elsewhere: Secondary | ICD-10-CM | POA: Diagnosis not present

## 2022-05-26 DIAGNOSIS — N400 Enlarged prostate without lower urinary tract symptoms: Secondary | ICD-10-CM | POA: Diagnosis not present

## 2022-05-26 DIAGNOSIS — Z7689 Persons encountering health services in other specified circumstances: Secondary | ICD-10-CM | POA: Insufficient documentation

## 2022-05-26 DIAGNOSIS — I130 Hypertensive heart and chronic kidney disease with heart failure and stage 1 through stage 4 chronic kidney disease, or unspecified chronic kidney disease: Secondary | ICD-10-CM | POA: Diagnosis present

## 2022-05-26 DIAGNOSIS — I5042 Chronic combined systolic (congestive) and diastolic (congestive) heart failure: Secondary | ICD-10-CM | POA: Diagnosis not present

## 2022-05-26 DIAGNOSIS — N1832 Chronic kidney disease, stage 3b: Secondary | ICD-10-CM | POA: Insufficient documentation

## 2022-05-26 DIAGNOSIS — N270 Small kidney, unilateral: Secondary | ICD-10-CM | POA: Diagnosis not present

## 2022-05-26 LAB — CBC
HCT: 35.1 % — ABNORMAL LOW (ref 39.0–52.0)
Hemoglobin: 11.2 g/dL — ABNORMAL LOW (ref 13.0–17.0)
MCH: 27.5 pg (ref 26.0–34.0)
MCHC: 31.9 g/dL (ref 30.0–36.0)
MCV: 86.2 fL (ref 80.0–100.0)
Platelets: 212 10*3/uL (ref 150–400)
RBC: 4.07 MIL/uL — ABNORMAL LOW (ref 4.22–5.81)
RDW: 16.3 % — ABNORMAL HIGH (ref 11.5–15.5)
WBC: 5.3 10*3/uL (ref 4.0–10.5)
nRBC: 0 % (ref 0.0–0.2)

## 2022-05-26 LAB — RENAL FUNCTION PANEL
Albumin: 3.6 g/dL (ref 3.5–5.0)
Anion gap: 6 (ref 5–15)
BUN: 22 mg/dL (ref 8–23)
CO2: 26 mmol/L (ref 22–32)
Calcium: 9.2 mg/dL (ref 8.9–10.3)
Chloride: 108 mmol/L (ref 98–111)
Creatinine, Ser: 1.72 mg/dL — ABNORMAL HIGH (ref 0.61–1.24)
GFR, Estimated: 39 mL/min — ABNORMAL LOW (ref >60)
Glucose, Bld: 82 mg/dL (ref 70–99)
Phosphorus: 3.1 mg/dL (ref 2.5–4.6)
Potassium: 4.2 mmol/L (ref 3.5–5.1)
Sodium: 140 mmol/L (ref 135–145)

## 2022-05-26 LAB — PROTEIN / CREATININE RATIO, URINE
Creatinine, Urine: 79.34 mg/dL
Protein Creatinine Ratio: 0.3 mg/mg{Cre} — ABNORMAL HIGH (ref 0.00–0.15)
Total Protein, Urine: 24 mg/dL

## 2022-05-27 LAB — PTH, INTACT AND CALCIUM
Calcium, Total (PTH): 9.5 mg/dL (ref 8.6–10.2)
PTH: 60 pg/mL (ref 15–65)

## 2022-06-03 ENCOUNTER — Ambulatory Visit: Payer: Medicare HMO | Admitting: Orthopaedic Surgery

## 2022-06-10 ENCOUNTER — Ambulatory Visit: Payer: Medicare HMO | Admitting: Cardiovascular Disease

## 2022-07-11 ENCOUNTER — Ambulatory Visit: Payer: Medicare HMO | Attending: Cardiology | Admitting: Cardiology

## 2022-07-11 ENCOUNTER — Encounter: Payer: Self-pay | Admitting: Cardiology

## 2022-07-11 ENCOUNTER — Other Ambulatory Visit: Payer: Self-pay | Admitting: Physician Assistant

## 2022-07-11 VITALS — BP 146/88 | HR 77 | Ht 68.0 in | Wt 231.4 lb

## 2022-07-11 DIAGNOSIS — I25119 Atherosclerotic heart disease of native coronary artery with unspecified angina pectoris: Secondary | ICD-10-CM

## 2022-07-11 DIAGNOSIS — E782 Mixed hyperlipidemia: Secondary | ICD-10-CM | POA: Diagnosis not present

## 2022-07-11 DIAGNOSIS — I340 Nonrheumatic mitral (valve) insufficiency: Secondary | ICD-10-CM | POA: Diagnosis not present

## 2022-07-11 DIAGNOSIS — I5020 Unspecified systolic (congestive) heart failure: Secondary | ICD-10-CM | POA: Diagnosis not present

## 2022-07-11 DIAGNOSIS — N1832 Chronic kidney disease, stage 3b: Secondary | ICD-10-CM

## 2022-07-11 MED ORDER — DAPAGLIFLOZIN PROPANEDIOL 10 MG PO TABS: 10.0000 mg | ORAL_TABLET | Freq: Every day | ORAL | 11 refills | Status: DC

## 2022-07-11 MED ORDER — FUROSEMIDE 20 MG PO TABS: 20.0000 mg | ORAL_TABLET | ORAL | 3 refills | Status: DC

## 2022-07-11 NOTE — Patient Instructions (Signed)
Medication Instructions:    DECREASE Lasix to 20 mg every OTHER day    Take Farxiga 10 mg daily   Labwork: None today  Testing/Procedures: None today  Follow-Up: 6 months  Any Other Special Instructions Will Be Listed Below (If Applicable).  If you need a refill on your cardiac medications before your next appointment, please call your pharmacy.

## 2022-07-11 NOTE — Progress Notes (Signed)
Cardiology Office Note  Date: 07/11/2022   ID: Corey [name], DOB Oct 02, 1935, MRN [id number]  PCP:  Redmond School, MD  Cardiologist:  Rozann Lesches, MD Electrophysiologist:  None   Chief Complaint  Patient presents with   Cardiac follow-up    History of Present Illness: Corey [name] is an 87 y.o. male last seen in August 2023 by Ms. Dunn PA-C, I reviewed the note.  He has also had interval follow-up with Dr. Gwenlyn Found for PAD.  He is here for a routine visit.  States that he has had a recent cold, but prior to that was going to the [facility] as usual for his typical exercise.  He reports NYHA class II dyspnea, no angina or palpitations.  No syncope.  We went over his medications today.  I discussed addition of [place].  He had lab work per nephrology which I reviewed and states that he is taking his medications regularly.  Most recent creatinine was 1.72 with normal potassium.  His LDL was 46 at last check.  He tells me that systolic blood pressure at home ranges between 120 and 140.  Past Medical History:  Diagnosis Date   Anaphylaxis    Aspirin   Chronic kidney disease, stage 3b ([place])    Coronary atherosclerosis of native coronary artery    a. s/p BMS to RCA and LCx in 2009 b. DES to D1 and RCA in 2015 c. CABG in 09/2019 with LIMA-LAD and SVG-D1   Essential hypertension    HFrEF (heart failure with reduced ejection fraction) ([place])    a. EF 45-50% in 09/2019 b. EF 20-25% in 06/2021 (had been off medications in the interim)   Hyperlipidemia    Myocardial infarction [facility]) 1998   NSTEMI (non-ST elevated myocardial infarction) ([place]) 03/2008   Seasonal allergies     Past Surgical History:  Procedure Laterality Date   CARDIAC CATHETERIZATION     Cath 05/18/2014 DES to D1 and mid RCA   COLONOSCOPY  05/01/2011   Procedure: COLONOSCOPY;  Surgeon: Daneil Dolin, MD;  [facility];  Service: Endoscopy;  Laterality: N/A;  11:30   CORONARY ANGIOPLASTY WITH STENT  PLACEMENT  07/1996; 03/2008; 05/18/2014   "?3; ?2; 2"   CORONARY ARTERY BYPASS GRAFT N/A 10/14/2019   Procedure: CORONARY ARTERY BYPASS GRAFTING (CABG) x 2;  Surgeon: Gaye Pollack, MD;  Location: [place];  Service: Open Heart Surgery;  Laterality: N/A;   ENDOVEIN HARVEST OF GREATER SAPHENOUS VEIN Right 10/14/2019   Procedure: [place] Of Greater Saphenous Vein;  Surgeon: Gaye Pollack, MD;  Location: [facility] OR;  Service: Open Heart Surgery;  Laterality: Right;   INTRAVASCULAR PRESSURE WIRE/FFR STUDY N/A 10/10/2019   Procedure: INTRAVASCULAR PRESSURE WIRE/FFR STUDY;  Surgeon: Nelva Bush, MD;  Location: [place] CV LAB;  Service: Cardiovascular;  Laterality: N/A;   LEFT HEART CATH AND CORONARY ANGIOGRAPHY N/A 10/10/2019   Procedure: LEFT HEART CATH AND CORONARY ANGIOGRAPHY;  Surgeon: Nelva Bush, MD;  Location: [place] CV LAB;  Service: Cardiovascular;  Laterality: N/A;   LEFT HEART CATHETERIZATION WITH CORONARY ANGIOGRAM N/A 05/18/2014   Procedure: LEFT HEART CATHETERIZATION WITH CORONARY ANGIOGRAM;  Surgeon: Blane Ohara, MD;  Location: [facility] CATH LAB;  Service: Cardiovascular;  Laterality: N/A;   TEE WITHOUT CARDIOVERSION N/A 10/14/2019   Procedure: TRANSESOPHAGEAL ECHOCARDIOGRAM (TEE);  Surgeon: Gaye Pollack, MD;  Location: [place];  Service: Open Heart Surgery;  Laterality: N/A;    Current Outpatient Medications  Medication Sig  Dispense Refill   Ascorbic Acid (VITAMIN C) 1000 MG tablet Take 1,000 mg by mouth daily.     atorvastatin (LIPITOR) 80 MG tablet Take 1 tablet daily at 6 pm. 90 tablet 3   carvedilol (COREG) 12.5 MG tablet Take 1 tablet (12.5 mg total) by mouth 2 (two) times daily. 180 tablet 3   Cholecalciferol 50 MCG (2000 UT) CAPS Take by mouth.     cilostazol (PLETAL) 50 MG tablet Take 1 tablet (50 mg total) by mouth 2 (two) times daily. 180 tablet 1   clopidogrel (PLAVIX) 75 MG tablet TAKE 1 TABLET BY MOUTH ONCE DAILY - NEED FOLLOW UP FOR REFILLS 90 tablet 3    dapagliflozin propanediol (FARXIGA) 10 MG TABS tablet Take 1 tablet (10 mg total) by mouth daily before breakfast. 30 tablet 11   ezetimibe (ZETIA) 10 MG tablet Take 1 tablet by mouth once daily 90 tablet 0   furosemide (LASIX) 20 MG tablet Take 1 tablet (20 mg total) by mouth every other day. 45 tablet 3   hydrALAZINE (APRESOLINE) 50 MG tablet Take 50 mg by mouth 2 (two) times daily.     Omega-3 Fatty Acids (FISH OIL) 1000 MG CAPS Take 1,000 mg by mouth 2 (two) times daily.     sacubitril-valsartan (ENTRESTO) 97-103 MG Take 1 tablet by mouth twice daily 180 tablet 3   spironolactone (ALDACTONE) 25 MG tablet Take 25 mg by mouth every other day.     vitamin E 1000 UNIT capsule Take 1 capsule (1,000 Units total) by mouth daily.     No current facility-administered medications for this visit.   Allergies:  Aspirin, Other, and Penicillins   ROS: No palpitations or syncope.  Improved leg edema.  Physical Exam: VS:  BP (!) 146/88   Pulse 77   Ht '5\' 8"'$  (1.727 m)   Wt 231 lb 6.4 oz (105 kg)   SpO2 97%   BMI 35.18 kg/m , BMI Body mass index is 35.18 kg/m.  Wt Readings from Last 3 Encounters:  07/11/22 231 lb 6.4 oz (105 kg)  05/13/22 250 lb (113.4 kg)  04/29/22 227 lb (103 kg)    General: Patient appears comfortable at rest. HEENT: Conjunctiva and lids normal. Neck: Supple, no elevated JVP or carotid bruits. Lungs: Clear to auscultation, nonlabored breathing at rest. Cardiac: Regular rate and rhythm, no S3, 2/6 systolic murmur. Extremities: Mild ankle edema.  ECG:  An ECG dated 11/12/2021 was personally reviewed today and demonstrated:  Sinus rhythm with LVH and repolarization abnormalities, PVCs.  Recent Labwork: 11/11/2021: TSH 3.041 12/10/2021: BNP 799.3 12/25/2021: ALT 23; AST 23; Magnesium 1.9 05/26/2022: BUN 22; Creatinine, Ser 1.72; Hemoglobin 11.2; Platelets 212; Potassium 4.2; Sodium 140     Component Value Date/Time   CHOL 101 11/11/2021 1108   TRIG 102 11/11/2021 1108    HDL 35 (L) 11/11/2021 1108   CHOLHDL 2.9 11/11/2021 1108   VLDL 20 11/11/2021 1108   LDLCALC 46 11/11/2021 1108   LDLDIRECT 60.8 11/11/2021 1108    Other Studies Reviewed Today:  Myocardial PET imaging 01/14/2022:   Findings are consistent with prior myocardial infarction with mild peri-infarct ischemia. The study is intermediate risk due to LV dysfunction.   LV perfusion is abnormal. There is evidence of ischemia. There is evidence of infarction. Defect 1: There is a medium defect with moderate reduction in uptake present in the apical to basal inferior and inferoseptal location(s) that is partially reversible. There is abnormal wall motion in the defect area.  Consistent with infarction and peri-infarct ischemia.   Rest left ventricular function is abnormal. Rest global function is severely reduced. Rest EF: 20 %. Stress left ventricular function is abnormal. Stress global function is severely reduced. Stress EF: 30 %. End diastolic cavity size is severely enlarged. End systolic cavity size is severely enlarged.   Myocardial blood flow reserve is not reported in this patient due to technical or patient-specific concerns that affect accuracy.   Coronary calcium assessment not performed due to prior revascularization.  Echocardiogram 11/06/2021:  1. Limited echo.   2. NO significant change in LVEF from echo in Jan 2023.   3. Left ventricular ejection fraction, by estimation, is 25%%. The left  ventricle demonstrates global hypokinesis. There is mild concentric left  ventricular hypertrophy.   4. Right ventricular systolic function is normal. The right ventricular  size is normal.   5. The inferior vena cava is dilated in size with >50% respiratory  variability, suggesting right atrial pressure of 8 mmHg.   Assessment and Plan:  1.  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  LVEF 25 to 30% range overall.  Myocardial PET imaging from July of last year showed  mixed scar with mild peri-infarct ischemia and plan has been medical therapy optimization.  Plan is to continue Coreg, hydralazine, Entresto, and Aldactone.  Add Farxiga 10 mg daily and reduce Lasix to 20 mg every other day.  He will continue to have lab work per nephrology.  2.  CKD stage IIIb, continue to follow with nephrology.  Most recent creatinine was 1.72.  3.  Multivessel CAD status post CABG in 2021.  No angina reported.  Continue Plavix and Lipitor.  Most recent LDL was 46.  4.  Mild to moderate mitral regurgitation by echocardiogram in January 2023.  5.  PAD evaluated by Dr. Gwenlyn Found.  Medication Adjustments/Labs and Tests Ordered: Current medicines are reviewed at length with the patient today.  Concerns regarding medicines are outlined above.   Tests Ordered: No orders of the defined types were placed in this encounter.   Medication Changes: Meds ordered this encounter  Medications   dapagliflozin propanediol (FARXIGA) 10 MG TABS tablet    Sig: Take 1 tablet (10 mg total) by mouth daily before breakfast.    Dispense:  30 tablet    Refill:  11   furosemide (LASIX) 20 MG tablet    Sig: Take 1 tablet (20 mg total) by mouth every other day.    Dispense:  45 tablet    Refill:  3    Disposition:  Follow up  6 months.  Signed, Satira Sark, MD, [facility] 07/11/2022 4:04 PM    [facility] at [facility] [address], [place], [place] [address] Phone: [phone]; Fax: [phone]

## 2022-07-14 ENCOUNTER — Telehealth: Payer: Self-pay | Admitting: Cardiology

## 2022-07-14 NOTE — Telephone Encounter (Signed)
Patient notified and verbalized understanding. Patient had no further questions or concerns at this time.  

## 2022-07-14 NOTE — Telephone Encounter (Signed)
Mr. Lina was here last week he was able to pick up all his prescriptions except for the entresto. [facility] will not fill it for him.

## 2022-07-14 NOTE — Telephone Encounter (Signed)
Spoke to [employer] who stated that when patient attempted to pick Entresto up last week, it was too early to pick up. Pharmacist re-ran medication and it went through. Will inform patient.

## 2022-08-09 ENCOUNTER — Other Ambulatory Visit: Payer: Self-pay | Admitting: Cardiology

## 2022-08-19 ENCOUNTER — Other Ambulatory Visit (HOSPITAL_COMMUNITY)
Admission: RE | Admit: 2022-08-19 | Discharge: 2022-08-19 | Disposition: A | Discharge status: Home / Self Care | Payer: Medicare HMO | Source: Ambulatory Visit | Attending: Nephrology | Admitting: Nephrology

## 2022-08-19 DIAGNOSIS — N1832 Chronic kidney disease, stage 3b: Secondary | ICD-10-CM | POA: Diagnosis present

## 2022-08-19 LAB — RENAL FUNCTION PANEL
Albumin: 3.5 g/dL (ref 3.5–5.0)
Anion gap: 7 (ref 5–15)
BUN: 21 mg/dL (ref 8–23)
CO2: 20 mmol/L — ABNORMAL LOW (ref 22–32)
Calcium: 8.5 mg/dL — ABNORMAL LOW (ref 8.9–10.3)
Chloride: 108 mmol/L (ref 98–111)
Creatinine, Ser: 1.85 mg/dL — ABNORMAL HIGH (ref 0.61–1.24)
GFR, Estimated: 35 mL/min — ABNORMAL LOW (ref >60)
Glucose, Bld: 105 mg/dL — ABNORMAL HIGH (ref 70–99)
Phosphorus: 3.2 mg/dL (ref 2.5–4.6)
Potassium: 3.8 mmol/L (ref 3.5–5.1)
Sodium: 135 mmol/L (ref 135–145)

## 2022-08-19 LAB — CBC
HCT: 36.4 % — ABNORMAL LOW (ref 39.0–52.0)
Hemoglobin: 12 g/dL — ABNORMAL LOW (ref 13.0–17.0)
MCH: 28.2 pg (ref 26.0–34.0)
MCHC: 33 g/dL (ref 30.0–36.0)
MCV: 85.6 fL (ref 80.0–100.0)
Platelets: 180 10*3/uL (ref 150–400)
RBC: 4.25 MIL/uL (ref 4.22–5.81)
RDW: 16.4 % — ABNORMAL HIGH (ref 11.5–15.5)
WBC: 5.1 10*3/uL (ref 4.0–10.5)
nRBC: 0 % (ref 0.0–0.2)

## 2022-08-19 LAB — PROTEIN / CREATININE RATIO, URINE
Creatinine, Urine: 99 mg/dL
Protein Creatinine Ratio: 0.28 mg/mg{Cre} — ABNORMAL HIGH (ref 0.00–0.15)
Total Protein, Urine: 28 mg/dL

## 2022-08-19 LAB — IRON AND TIBC
Iron: 68 ug/dL (ref 45–182)
Saturation Ratios: 22 % (ref 17.9–39.5)
TIBC: 308 ug/dL (ref 250–450)
UIBC: 240 ug/dL

## 2022-08-19 LAB — VITAMIN B12: Vitamin B-12: 611 pg/mL (ref 180–914)

## 2022-08-19 LAB — FERRITIN: Ferritin: 25 ng/mL (ref 24–336)

## 2022-08-20 LAB — PTH, INTACT AND CALCIUM
Calcium, Total (PTH): 8.6 mg/dL (ref 8.6–10.2)
PTH: 60 pg/mL (ref 15–65)

## 2022-08-28 ENCOUNTER — Encounter: Payer: Self-pay | Admitting: Radiology

## 2022-08-30 ENCOUNTER — Other Ambulatory Visit: Payer: Self-pay | Admitting: Cardiovascular Disease

## 2022-09-08 ENCOUNTER — Ambulatory Visit: Payer: Medicare HMO | Admitting: Cardiology

## 2022-10-21 ENCOUNTER — Other Ambulatory Visit (HOSPITAL_COMMUNITY)
Admission: RE | Admit: 2022-10-21 | Discharge: 2022-10-21 | Disposition: A | Discharge status: Home / Self Care | Payer: Medicare HMO | Source: Ambulatory Visit | Attending: Nephrology | Admitting: Nephrology

## 2022-10-21 DIAGNOSIS — N1832 Chronic kidney disease, stage 3b: Secondary | ICD-10-CM | POA: Insufficient documentation

## 2022-10-21 DIAGNOSIS — Z7689 Persons encountering health services in other specified circumstances: Secondary | ICD-10-CM | POA: Insufficient documentation

## 2022-10-21 DIAGNOSIS — D638 Anemia in other chronic diseases classified elsewhere: Secondary | ICD-10-CM | POA: Insufficient documentation

## 2022-10-21 DIAGNOSIS — I129 Hypertensive chronic kidney disease with stage 1 through stage 4 chronic kidney disease, or unspecified chronic kidney disease: Secondary | ICD-10-CM | POA: Insufficient documentation

## 2022-10-21 DIAGNOSIS — I5042 Chronic combined systolic (congestive) and diastolic (congestive) heart failure: Secondary | ICD-10-CM | POA: Diagnosis not present

## 2022-10-21 LAB — RENAL FUNCTION PANEL
Albumin: 3.7 g/dL (ref 3.5–5.0)
Anion gap: 9 (ref 5–15)
BUN: 27 mg/dL — ABNORMAL HIGH (ref 8–23)
CO2: 18 mmol/L — ABNORMAL LOW (ref 22–32)
Calcium: 9 mg/dL (ref 8.9–10.3)
Chloride: 106 mmol/L (ref 98–111)
Creatinine, Ser: 1.87 mg/dL — ABNORMAL HIGH (ref 0.61–1.24)
GFR, Estimated: 35 mL/min — ABNORMAL LOW (ref >60)
Glucose, Bld: 90 mg/dL (ref 70–99)
Phosphorus: 3.4 mg/dL (ref 2.5–4.6)
Potassium: 4 mmol/L (ref 3.5–5.1)
Sodium: 133 mmol/L — ABNORMAL LOW (ref 135–145)

## 2022-10-21 LAB — PROTEIN / CREATININE RATIO, URINE
Creatinine, Urine: 33 mg/dL
Protein Creatinine Ratio: 0.39 mg/mg{Cre} — ABNORMAL HIGH (ref 0.00–0.15)
Total Protein, Urine: 13 mg/dL

## 2022-10-21 LAB — CBC
HCT: 37.9 % — ABNORMAL LOW (ref 39.0–52.0)
Hemoglobin: 12.6 g/dL — ABNORMAL LOW (ref 13.0–17.0)
MCH: 28.1 pg (ref 26.0–34.0)
MCHC: 33.2 g/dL (ref 30.0–36.0)
MCV: 84.6 fL (ref 80.0–100.0)
Platelets: 182 10*3/uL (ref 150–400)
RBC: 4.48 MIL/uL (ref 4.22–5.81)
RDW: 16.1 % — ABNORMAL HIGH (ref 11.5–15.5)
WBC: 5.3 10*3/uL (ref 4.0–10.5)
nRBC: 0 % (ref 0.0–0.2)

## 2022-10-23 LAB — PTH, INTACT AND CALCIUM
Calcium, Total (PTH): 9.3 mg/dL (ref 8.6–10.2)
PTH: 74 pg/mL — ABNORMAL HIGH (ref 15–65)

## 2022-11-08 ENCOUNTER — Other Ambulatory Visit: Payer: Self-pay | Admitting: Cardiovascular Disease

## 2022-12-01 ENCOUNTER — Other Ambulatory Visit (HOSPITAL_COMMUNITY)
Admission: RE | Admit: 2022-12-01 | Discharge: 2022-12-01 | Disposition: A | Discharge status: Home / Self Care | Payer: Medicare HMO | Source: Ambulatory Visit | Attending: Nephrology | Admitting: Nephrology

## 2022-12-01 DIAGNOSIS — N1832 Chronic kidney disease, stage 3b: Secondary | ICD-10-CM | POA: Diagnosis not present

## 2022-12-01 DIAGNOSIS — R809 Proteinuria, unspecified: Secondary | ICD-10-CM | POA: Insufficient documentation

## 2022-12-01 DIAGNOSIS — D631 Anemia in chronic kidney disease: Secondary | ICD-10-CM | POA: Insufficient documentation

## 2022-12-01 DIAGNOSIS — Z79899 Other long term (current) drug therapy: Secondary | ICD-10-CM | POA: Insufficient documentation

## 2022-12-01 LAB — CBC WITH DIFFERENTIAL/PLATELET
Abs Immature Granulocytes: 0.01 10*3/uL (ref 0.00–0.07)
Basophils Absolute: 0 10*3/uL (ref 0.0–0.1)
Basophils Relative: 1 %
Eosinophils Absolute: 0.5 10*3/uL (ref 0.0–0.5)
Eosinophils Relative: 10 %
HCT: 37.1 % — ABNORMAL LOW (ref 39.0–52.0)
Hemoglobin: 12 g/dL — ABNORMAL LOW (ref 13.0–17.0)
Immature Granulocytes: 0 %
Lymphocytes Relative: 31 %
Lymphs Abs: 1.5 10*3/uL (ref 0.7–4.0)
MCH: 27.6 pg (ref 26.0–34.0)
MCHC: 32.3 g/dL (ref 30.0–36.0)
MCV: 85.3 fL (ref 80.0–100.0)
Monocytes Absolute: 0.6 10*3/uL (ref 0.1–1.0)
Monocytes Relative: 12 %
Neutro Abs: 2.2 10*3/uL (ref 1.7–7.7)
Neutrophils Relative %: 46 %
Platelets: 160 10*3/uL (ref 150–400)
RBC: 4.35 MIL/uL (ref 4.22–5.81)
RDW: 16 % — ABNORMAL HIGH (ref 11.5–15.5)
WBC: 4.8 10*3/uL (ref 4.0–10.5)
nRBC: 0 % (ref 0.0–0.2)

## 2022-12-01 LAB — RENAL FUNCTION PANEL
Albumin: 3.7 g/dL (ref 3.5–5.0)
Anion gap: 9 (ref 5–15)
BUN: 28 mg/dL — ABNORMAL HIGH (ref 8–23)
CO2: 19 mmol/L — ABNORMAL LOW (ref 22–32)
Calcium: 8.8 mg/dL — ABNORMAL LOW (ref 8.9–10.3)
Chloride: 107 mmol/L (ref 98–111)
Creatinine, Ser: 2.05 mg/dL — ABNORMAL HIGH (ref 0.61–1.24)
GFR, Estimated: 31 mL/min — ABNORMAL LOW (ref >60)
Glucose, Bld: 106 mg/dL — ABNORMAL HIGH (ref 70–99)
Phosphorus: 3.5 mg/dL (ref 2.5–4.6)
Potassium: 4.6 mmol/L (ref 3.5–5.1)
Sodium: 135 mmol/L (ref 135–145)

## 2022-12-01 LAB — PROTEIN / CREATININE RATIO, URINE
Creatinine, Urine: 34 mg/dL
Protein Creatinine Ratio: 0.21 mg/mg{Cre} — ABNORMAL HIGH (ref 0.00–0.15)
Total Protein, Urine: 7 mg/dL

## 2022-12-20 ENCOUNTER — Other Ambulatory Visit (HOSPITAL_BASED_OUTPATIENT_CLINIC_OR_DEPARTMENT_OTHER): Payer: Self-pay | Admitting: Family

## 2022-12-26 ENCOUNTER — Encounter (HOSPITAL_COMMUNITY): Payer: Self-pay | Admitting: Emergency Medicine

## 2022-12-26 ENCOUNTER — Other Ambulatory Visit: Payer: Self-pay

## 2022-12-26 ENCOUNTER — Emergency Department (HOSPITAL_COMMUNITY): Payer: Medicare HMO

## 2022-12-26 ENCOUNTER — Emergency Department (HOSPITAL_COMMUNITY)
Admission: EM | Admit: 2022-12-26 | Discharge: 2022-12-27 | Disposition: A | Discharge status: Home / Self Care | Payer: Medicare HMO | Attending: Emergency Medicine | Admitting: Emergency Medicine

## 2022-12-26 DIAGNOSIS — I130 Hypertensive heart and chronic kidney disease with heart failure and stage 1 through stage 4 chronic kidney disease, or unspecified chronic kidney disease: Secondary | ICD-10-CM | POA: Insufficient documentation

## 2022-12-26 DIAGNOSIS — Z7902 Long term (current) use of antithrombotics/antiplatelets: Secondary | ICD-10-CM | POA: Diagnosis not present

## 2022-12-26 DIAGNOSIS — N189 Chronic kidney disease, unspecified: Secondary | ICD-10-CM | POA: Diagnosis not present

## 2022-12-26 DIAGNOSIS — I959 Hypotension, unspecified: Secondary | ICD-10-CM | POA: Diagnosis not present

## 2022-12-26 DIAGNOSIS — R55 Syncope and collapse: Secondary | ICD-10-CM

## 2022-12-26 DIAGNOSIS — I509 Heart failure, unspecified: Secondary | ICD-10-CM | POA: Diagnosis not present

## 2022-12-26 DIAGNOSIS — R464 Slowness and poor responsiveness: Secondary | ICD-10-CM | POA: Insufficient documentation

## 2022-12-26 DIAGNOSIS — R404 Transient alteration of awareness: Secondary | ICD-10-CM

## 2022-12-26 LAB — CBC
HCT: 34.8 % — ABNORMAL LOW (ref 39.0–52.0)
Hemoglobin: 11.1 g/dL — ABNORMAL LOW (ref 13.0–17.0)
MCH: 27.4 pg (ref 26.0–34.0)
MCHC: 31.9 g/dL (ref 30.0–36.0)
MCV: 85.9 fL (ref 80.0–100.0)
Platelets: 170 10*3/uL (ref 150–400)
RBC: 4.05 MIL/uL — ABNORMAL LOW (ref 4.22–5.81)
RDW: 16 % — ABNORMAL HIGH (ref 11.5–15.5)
WBC: 4.3 10*3/uL (ref 4.0–10.5)
nRBC: 0 % (ref 0.0–0.2)

## 2022-12-26 LAB — BASIC METABOLIC PANEL
Anion gap: 8 (ref 5–15)
BUN: 26 mg/dL — ABNORMAL HIGH (ref 8–23)
CO2: 19 mmol/L — ABNORMAL LOW (ref 22–32)
Calcium: 8.6 mg/dL — ABNORMAL LOW (ref 8.9–10.3)
Chloride: 111 mmol/L (ref 98–111)
Creatinine, Ser: 2.23 mg/dL — ABNORMAL HIGH (ref 0.61–1.24)
GFR, Estimated: 28 mL/min — ABNORMAL LOW (ref >60)
Glucose, Bld: 150 mg/dL — ABNORMAL HIGH (ref 70–99)
Potassium: 4 mmol/L (ref 3.5–5.1)
Sodium: 138 mmol/L (ref 135–145)

## 2022-12-26 LAB — LACTIC ACID, PLASMA: Lactic Acid, Venous: 1.5 mmol/L (ref 0.5–1.9)

## 2022-12-26 LAB — MAGNESIUM: Magnesium: 2 mg/dL (ref 1.7–2.4)

## 2022-12-26 LAB — CBG MONITORING, ED: Glucose-Capillary: 143 mg/dL — ABNORMAL HIGH (ref 70–99)

## 2022-12-26 LAB — TROPONIN I (HIGH SENSITIVITY): Troponin I (High Sensitivity): 11 ng/L (ref <18)

## 2022-12-26 MED ORDER — LACTATED RINGERS IV BOLUS
1000.0000 mL | Freq: Once | INTRAVENOUS | Status: AC
  Administered 2022-12-26: 1000 mL via INTRAVENOUS

## 2022-12-26 NOTE — Discharge Instructions (Signed)
You are seen in the emergency department after an unresponsive spell at home.  Your blood pressure was low but improved with some time and IV fluids.  Your lab work did not show an obvious explanation for your symptoms.  It potentially is related to some dehydration and medication.  Please rest and hydrate at home.  Follow-up with your regular doctor.  Return to the emergency department if any worsening or concerning symptoms.

## 2022-12-26 NOTE — ED Triage Notes (Signed)
EMS called by family for unconscious person. EMS states that upon their arrival, pt was unconscious in a chair and remained that way while they placed him on their stretcher and during loading into ambulance. EMS states that pt woke up when they "stuck him" while trying to obtain an IV. EMS also states pt's initial BP was 64/50. Pt currently alert and oriented x 4.

## 2022-12-26 NOTE — ED Notes (Signed)
Assumed care of pt, who additionally states he has had L shoulder pain x 2 days, and took "some medication for pain, and thinks he passed out after taking it"

## 2022-12-26 NOTE — ED Provider Notes (Signed)
  Provider Note MRN:  [id number]  Arrival date & time: 12/27/22    ED Course and Medical Decision Making  Assumed care from Dr. Charm Barges at shift change.  Difficult to wake at home, hypotensive but improving, feels like he is back to his baseline at this time.  Will continue to monitor blood pressures.  Patient observed for nearly 5 hours in the emergency department.  With IV fluids he has had a good recovery and his blood pressure back to his baseline.  He has felt well the entire time, no symptoms.  Workup is reassuring.  He has been outside in the heat recently, possibly just a bit of dehydration causing the low blood pressures and the episode of difficulty waking possibly related to muscle relaxer use.  No emergent process is evident, appropriate for discharge.  Procedures  Final Clinical Impressions(s) / ED Diagnoses     ICD-10-CM   1. Hypotension, unspecified hypotension type  I95.9     2. Episode of unresponsiveness  R40.4       ED Discharge Orders     None         Discharge Instructions      You are seen in the emergency department after an unresponsive spell at home.  Your blood pressure was low but improved with some time and IV fluids.  Your lab work did not show an obvious explanation for your symptoms.  It potentially is related to some dehydration and medication.  Please rest and hydrate at home.  Follow-up with your regular doctor.  Return to the emergency department if any worsening or concerning symptoms.    Elmer Sow. Pilar Plate, MD [facility] Emergency Medicine [facility] [email or web address]    Sabas Sous, MD 12/27/22 [phone]

## 2022-12-26 NOTE — ED Provider Notes (Signed)
[place] EMERGENCY DEPARTMENT AT [facility] Provider Note   CSN: [id number] Arrival date & time: 12/26/22  2054     History {Add pertinent medical, surgical, social history, OB history to HPI:1} Chief Complaint  Patient presents with   Hypotension   Loss of Consciousness    Corey [name] is a 87 y.o. male.  He has a history of MI, CKD, CHF, hypertension.  EMS was called to his house after he was found unresponsive.  Patient responded to painful stimuli and is now awake alert.  Blood pressure is low.  He denies any specific complaints, remembers watching TV.  He denies any chest pain.  No short of breath.  He said Dr. Carlena Sax gave him the medication for some arthritis pain that he took tonight.  He denies any smoking alcohol or drugs.  The history is provided by the patient and the EMS personnel.  Loss of Consciousness Episode history:  Single Most recent episode:  Today Progression:  Resolved Chronicity:  New Relieved by:  Nothing Worsened by:  Nothing Ineffective treatments:  None tried Associated symptoms: no chest pain, no difficulty breathing, no fever, no headaches, no nausea, no shortness of breath and no vomiting   Risk factors: coronary artery disease        Home Medications Prior to Admission medications   Medication Sig Start Date End Date Taking? Authorizing Provider  Ascorbic Acid (VITAMIN C) 1000 MG tablet Take 1,000 mg by mouth daily.    [provider]  atorvastatin (LIPITOR) 80 MG tablet Take 1 tablet daily at 6 pm. 04/21/19   Jonelle Sidle, MD  carvedilol (COREG) 12.5 MG tablet Take 1 tablet by mouth twice daily 12/22/22   Jonelle Sidle, MD  Cholecalciferol 50 MCG (2000 UT) CAPS Take by mouth. 01/09/22 01/09/23  [provider]  cilostazol (PLETAL) 50 MG tablet Take 1 tablet by mouth twice daily 11/10/22   Runell Gess, MD  clopidogrel (PLAVIX) 75 MG tablet TAKE 1 TABLET BY MOUTH ONCE DAILY - NEED FOLLOW UP FOR  REFILLS 04/21/19   Jonelle Sidle, MD  dapagliflozin propanediol (FARXIGA) 10 MG TABS tablet Take 1 tablet (10 mg total) by mouth daily before breakfast. 07/11/22   Jonelle Sidle, MD  ezetimibe (ZETIA) 10 MG tablet Take 1 tablet by mouth once daily 08/11/22   Jonelle Sidle, MD  furosemide (LASIX) 20 MG tablet Take 1 tablet (20 mg total) by mouth every other day. 07/11/22 07/11/23  Jonelle Sidle, MD  hydrALAZINE (APRESOLINE) 50 MG tablet Take 50 mg by mouth 2 (two) times daily. 01/09/22   [provider]  Omega-3 Fatty Acids (FISH OIL) 1000 MG CAPS Take 1,000 mg by mouth 2 (two) times daily.    [provider]  sacubitril-valsartan (ENTRESTO) 97-103 MG Take 1 tablet by mouth twice daily 07/11/22   Dunn, Tacey Ruiz, PA-C  spironolactone (ALDACTONE) 25 MG tablet Take 25 mg by mouth every other day. 12/11/21   [provider]  vitamin E 1000 UNIT capsule Take 1 capsule (1,000 Units total) by mouth daily. 10/29/19   Ardelle Balls, PA-C      Allergies    Aspirin, Other, and Penicillins    Review of Systems   Review of Systems  Constitutional:  Negative for fever.  Respiratory:  Negative for shortness of breath.   Cardiovascular:  Positive for syncope. Negative for chest pain.  Gastrointestinal:  Negative for nausea and vomiting.  Musculoskeletal:  Positive  for arthralgias.  Neurological:  Negative for headaches.    Physical Exam Updated Vital Signs BP (!) 76/51   Pulse (!) 59   Temp (!) 97.5 F (36.4 C) (Oral)   Resp (!) 24   Ht 5\' 8"  (1.727 m)   Wt 102.1 kg   SpO2 94%   BMI 34.21 kg/m  Physical Exam Vitals and nursing note reviewed.  Constitutional:      General: He is not in acute distress.    Appearance: Normal appearance. He is well-developed.  HENT:     Head: Normocephalic and atraumatic.  Eyes:     Conjunctiva/sclera: Conjunctivae normal.  Cardiovascular:     Rate and Rhythm: Normal rate and regular rhythm.     Heart sounds: No  murmur heard. Pulmonary:     Effort: Pulmonary effort is normal. No respiratory distress.     Breath sounds: Normal breath sounds.  Abdominal:     Palpations: Abdomen is soft.     Tenderness: There is no abdominal tenderness. There is no guarding or rebound.  Musculoskeletal:        General: No swelling.     Cervical back: Neck supple.  Skin:    General: Skin is warm and dry.     Capillary Refill: Capillary refill takes less than 2 seconds.  Neurological:     General: No focal deficit present.     Mental Status: He is alert.     Cranial Nerves: No cranial nerve deficit.     Motor: No weakness.     ED Results / Procedures / Treatments   Labs (all labs ordered are listed, but only abnormal results are displayed) Labs Reviewed  BASIC METABOLIC PANEL - Abnormal; Notable for the following components:      Result Value   CO2 19 (*)    Glucose, Bld 150 (*)    BUN 26 (*)    Creatinine, Ser 2.23 (*)    Calcium 8.6 (*)    GFR, Estimated 28 (*)    All other components within normal limits  CBC - Abnormal; Notable for the following components:   RBC 4.05 (*)    Hemoglobin 11.1 (*)    HCT 34.8 (*)    RDW 16.0 (*)    All other components within normal limits  CBG MONITORING, ED - Abnormal; Notable for the following components:   Glucose-Capillary 143 (*)    All other components within normal limits  URINALYSIS, ROUTINE W REFLEX MICROSCOPIC  MAGNESIUM  CBG MONITORING, ED  TROPONIN I (HIGH SENSITIVITY)    EKG EKG Interpretation Date/Time:  Friday December 26 2022 21:04:08 EDT Ventricular Rate:  67 PR Interval:  201 QRS Duration:  145 QT Interval:  430 QTC Calculation: 454 R Axis:   -26  Text Interpretation: Sinus rhythm Ventricular trigeminy Left bundle branch block increased ectopy from prior 4/21 Confirmed by Meridee Score [phone]) on 12/26/2022 9:10:23 PM  Radiology No results found.  Procedures Procedures  {Document cardiac monitor, telemetry assessment procedure  when appropriate:1}  Medications Ordered in ED Medications  lactated ringers bolus 1,000 mL (has no administration in time range)    ED Course/ Medical Decision Making/ A&P   {   Click here for ABCD2, HEART and other calculatorsREFRESH Note before signing :1}                          Medical Decision Making Amount and/or Complexity of Data Reviewed Labs: ordered.   This  patient complains of ***; this involves an extensive number of treatment Options and is a complaint that carries with it a high risk of complications and morbidity. The differential includes ***  I ordered, reviewed and interpreted labs, which included *** I ordered medication *** and reviewed PMP when indicated. I ordered imaging studies which included *** and I independently    visualized and interpreted imaging which showed *** Additional history obtained from *** Previous records obtained and reviewed *** I consulted *** and discussed lab and imaging findings and discussed disposition.  Cardiac monitoring reviewed, *** Social determinants considered, *** Critical Interventions: ***  After the interventions stated above, I reevaluated the patient and found *** Admission and further testing considered, ***   {Document critical care time when appropriate:1} {Document review of labs and clinical decision tools ie heart score, Chads2Vasc2 etc:1}  {Document your independent review of radiology images, and any outside records:1} {Document your discussion with family members, caretakers, and with consultants:1} {Document social determinants of health affecting pt's care:1} {Document your decision making why or why not admission, treatments were needed:1} Final Clinical Impression(s) / ED Diagnoses Final diagnoses:  None    Rx / DC Orders ED Discharge Orders     None

## 2022-12-27 LAB — TROPONIN I (HIGH SENSITIVITY): Troponin I (High Sensitivity): 10 ng/L (ref <18)

## 2022-12-27 NOTE — ED Notes (Signed)
Patient verbalizes understanding of discharge instructions. Opportunity for questioning and answers were provided. Armband removed by staff, pt discharged from ED. Wheeled out to lobby with daughter  

## 2023-01-14 ENCOUNTER — Ambulatory Visit: Payer: Medicare HMO | Attending: Cardiology | Admitting: Cardiology

## 2023-01-14 ENCOUNTER — Encounter: Payer: Self-pay | Admitting: Cardiology

## 2023-01-14 VITALS — BP 144/62 | HR 76 | Ht 68.0 in | Wt 231.0 lb

## 2023-01-14 DIAGNOSIS — I25119 Atherosclerotic heart disease of native coronary artery with unspecified angina pectoris: Secondary | ICD-10-CM

## 2023-01-14 DIAGNOSIS — I5020 Unspecified systolic (congestive) heart failure: Secondary | ICD-10-CM

## 2023-01-14 DIAGNOSIS — N1832 Chronic kidney disease, stage 3b: Secondary | ICD-10-CM | POA: Diagnosis not present

## 2023-01-14 DIAGNOSIS — E782 Mixed hyperlipidemia: Secondary | ICD-10-CM | POA: Diagnosis not present

## 2023-01-14 NOTE — Progress Notes (Signed)
Cardiology Office Note  Date: 01/14/2023   ID: POPE BRUNTY, DOB [birth year], MRN [id number]  History of Present Illness: Corey [name] is an 87 y.o. male last seen in January.  He is here for a follow-up visit.  States that he continues to go to the [facility] Monday through Friday most weeks, uses the treadmill and NuStep machine.  Describes NYHA class II dyspnea overall, no angina, no palpitations or syncope.  I reviewed his medications which are stable from a cardiac perspective.  Also went over recent lab work.  His echocardiogram from May of last year revealed LVEF approximately 25%.  We have continued with GDMT as tolerated and have not pursued an ICD at advanced age.  Physical Exam: VS:  BP (!) 144/62 (BP Location: Left Arm, Patient Position: Sitting, Cuff Size: Normal)   Pulse 76   Ht 5\' 8"  (1.727 m)   Wt 231 lb (104.8 kg)   SpO2 96%   BMI 35.12 kg/m , BMI Body mass index is 35.12 kg/m.  Wt Readings from Last 3 Encounters:  01/14/23 231 lb (104.8 kg)  12/26/22 225 lb (102.1 kg)  07/11/22 231 lb 6.4 oz (105 kg)    General: Patient appears comfortable at rest. HEENT: Conjunctiva and lids normal. Neck: Supple, no elevated JVP or carotid bruits. Lungs: Clear to auscultation, nonlabored breathing at rest. Cardiac: Regular rate and rhythm, no S3, 2/6 systolic murmur. Extremities: Stable mild ankle edema.  ECG:  An ECG dated 12/26/2022 was personally reviewed today and demonstrated:  Sinus rhythm with ventricular trigeminy and IVCD consistent with left bundle branch block.  Labwork: 12/26/2022: BUN 26; Creatinine, Ser 2.23; Hemoglobin 11.1; Magnesium 2.0; Platelets 170; Potassium 4.0; Sodium 138     Component Value Date/Time   CHOL 101 11/11/2021 1108   TRIG 102 11/11/2021 1108   HDL 35 (L) 11/11/2021 1108   CHOLHDL 2.9 11/11/2021 1108   VLDL 20 11/11/2021 1108   LDLCALC 46 11/11/2021 1108   LDLDIRECT 60.8 11/11/2021 1108   Other Studies Reviewed  Today:  Echocardiogram 11/06/2021:  1. Limited echo.   2. NO significant change in LVEF from echo in Jan 2023.   3. Left ventricular ejection fraction, by estimation, is 25%. The left  ventricle demonstrates global hypokinesis. There is mild concentric left  ventricular hypertrophy.   4. Right ventricular systolic function is normal. The right ventricular  size is normal.   5. The inferior vena cava is dilated in size with >50% respiratory  variability, suggesting right atrial pressure of 8 mmHg.   Assessment and Plan:  1.  CAD status post BMS to the RCA and circumflex in 2009, DES to the first diagonal and RCA in 2015, and subsequent CABG in 2021 with LIMA to LAD and SVG to first diagonal.  He does not describe any active angina at this time and continues with a regular exercise plan.  Continue Plavix and Lipitor along with Zetia.  2.  Essential hypertension.  Systolic in the 140s today.  States that he checks blood pressure at home and similar range although has had some lightheadedness when he takes his medications.  Do not plan on further uptitrating therapy at this time.  3.  Mixed hyperlipidemia.  Continues on Lipitor and Zetia.  LDL 61 in May of last year.  4.  HFrEF with mixed cardiomyopathy.  LVEF 25 to 30% range.  Continue Coreg, Farxiga, hydralazine, Entresto, Aldactone, and Lasix.  Plan to update echocardiogram.  Do not plan on  pursuing ICD at advanced age.  5.  CKD stage IIIb.  Creatinine 2.23 in June.  Disposition:  Follow up  6 months.  Signed, Jonelle Sidle, M.D., F.A.C.C. [facility]

## 2023-01-14 NOTE — Patient Instructions (Signed)
 Medication Instructions:  Your physician recommends that you continue on your current medications as directed. Please refer to the Current Medication list given to you today.   Labwork: None today   Testing/Procedures: Your physician has requested that you have an echocardiogram. Echocardiography is a painless test that uses sound waves to create images of your heart. It provides your doctor with information about the size and shape of your heart and how well your heart's chambers and valves are working. This procedure takes approximately one hour. There are no restrictions for this procedure. Please do NOT wear cologne, perfume, aftershave, or lotions (deodorant is allowed). Please arrive 15 minutes prior to your appointment time.   Follow-Up: 6 months  Any Other Special Instructions Will Be Listed Below (If Applicable).  If you need a refill on your cardiac medications before your next appointment, please call your pharmacy.  

## 2023-01-23 ENCOUNTER — Other Ambulatory Visit: Payer: Self-pay | Admitting: *Deleted

## 2023-01-23 MED ORDER — DAPAGLIFLOZIN PROPANEDIOL 10 MG PO TABS: 10.0000 mg | ORAL_TABLET | Freq: Every day | ORAL | 11 refills | Status: DC

## 2023-02-19 ENCOUNTER — Ambulatory Visit (HOSPITAL_COMMUNITY)
Admission: RE | Admit: 2023-02-19 | Discharge: 2023-02-19 | Disposition: A | Discharge status: Home / Self Care | Payer: Medicare HMO | Source: Ambulatory Visit | Attending: Cardiology | Admitting: Cardiology

## 2023-02-19 DIAGNOSIS — I5020 Unspecified systolic (congestive) heart failure: Secondary | ICD-10-CM | POA: Diagnosis present

## 2023-02-19 DIAGNOSIS — I428 Other cardiomyopathies: Secondary | ICD-10-CM | POA: Diagnosis not present

## 2023-02-19 LAB — ECHOCARDIOGRAM COMPLETE
Area-P 1/2: 1.72 cm2
Calc EF: 43.5 %
MV VTI: 1.57 cm2
S' Lateral: 3.6 cm
Single Plane A2C EF: 28.9 %
Single Plane A4C EF: 50.1 %

## 2023-02-19 NOTE — Progress Notes (Signed)
*  PRELIMINARY RESULTS* Echocardiogram 2D Echocardiogram has been performed.  Stacey Drain 02/19/2023, 11:40 AM

## 2023-03-04 ENCOUNTER — Other Ambulatory Visit (HOSPITAL_COMMUNITY)
Admission: RE | Admit: 2023-03-04 | Discharge: 2023-03-04 | Disposition: A | Discharge status: Home / Self Care | Payer: Medicare HMO | Source: Ambulatory Visit | Attending: Nephrology | Admitting: Nephrology

## 2023-03-04 DIAGNOSIS — N189 Chronic kidney disease, unspecified: Secondary | ICD-10-CM | POA: Diagnosis present

## 2023-03-04 DIAGNOSIS — N2581 Secondary hyperparathyroidism of renal origin: Secondary | ICD-10-CM | POA: Diagnosis not present

## 2023-03-04 DIAGNOSIS — I5042 Chronic combined systolic (congestive) and diastolic (congestive) heart failure: Secondary | ICD-10-CM | POA: Diagnosis not present

## 2023-03-04 DIAGNOSIS — D7589 Other specified diseases of blood and blood-forming organs: Secondary | ICD-10-CM | POA: Insufficient documentation

## 2023-03-04 DIAGNOSIS — D649 Anemia, unspecified: Secondary | ICD-10-CM | POA: Diagnosis not present

## 2023-03-04 DIAGNOSIS — D539 Nutritional anemia, unspecified: Secondary | ICD-10-CM | POA: Insufficient documentation

## 2023-03-04 DIAGNOSIS — Z7689 Persons encountering health services in other specified circumstances: Secondary | ICD-10-CM | POA: Insufficient documentation

## 2023-03-04 DIAGNOSIS — D638 Anemia in other chronic diseases classified elsewhere: Secondary | ICD-10-CM | POA: Insufficient documentation

## 2023-03-04 DIAGNOSIS — R809 Proteinuria, unspecified: Secondary | ICD-10-CM | POA: Insufficient documentation

## 2023-03-04 DIAGNOSIS — N1832 Chronic kidney disease, stage 3b: Secondary | ICD-10-CM | POA: Insufficient documentation

## 2023-03-04 LAB — RENAL FUNCTION PANEL
Albumin: 3.7 g/dL (ref 3.5–5.0)
Anion gap: 8 (ref 5–15)
BUN: 27 mg/dL — ABNORMAL HIGH (ref 8–23)
CO2: 22 mmol/L (ref 22–32)
Calcium: 8.9 mg/dL (ref 8.9–10.3)
Chloride: 102 mmol/L (ref 98–111)
Creatinine, Ser: 2.11 mg/dL — ABNORMAL HIGH (ref 0.61–1.24)
GFR, Estimated: 30 mL/min — ABNORMAL LOW
Glucose, Bld: 99 mg/dL (ref 70–99)
Phosphorus: 2.8 mg/dL (ref 2.5–4.6)
Potassium: 4.9 mmol/L (ref 3.5–5.1)
Sodium: 132 mmol/L — ABNORMAL LOW (ref 135–145)

## 2023-03-04 LAB — PROTEIN / CREATININE RATIO, URINE
Creatinine, Urine: 56 mg/dL
Protein Creatinine Ratio: 0.21 mg/mg{creat} — ABNORMAL HIGH (ref 0.00–0.15)
Total Protein, Urine: 12 mg/dL

## 2023-03-04 LAB — CBC
HCT: 39.7 % (ref 39.0–52.0)
Hemoglobin: 12.5 g/dL — ABNORMAL LOW (ref 13.0–17.0)
MCH: 27.8 pg (ref 26.0–34.0)
MCHC: 31.5 g/dL (ref 30.0–36.0)
MCV: 88.2 fL (ref 80.0–100.0)
Platelets: 184 K/uL (ref 150–400)
RBC: 4.5 MIL/uL (ref 4.22–5.81)
RDW: 15.7 % — ABNORMAL HIGH (ref 11.5–15.5)
WBC: 4.8 K/uL (ref 4.0–10.5)
nRBC: 0 % (ref 0.0–0.2)

## 2023-03-04 LAB — IRON AND TIBC
Iron: 71 ug/dL (ref 45–182)
Saturation Ratios: 22 % (ref 17.9–39.5)
TIBC: 331 ug/dL (ref 250–450)
UIBC: 260 ug/dL

## 2023-03-04 LAB — VITAMIN B12: Vitamin B-12: >7500 pg/mL — ABNORMAL HIGH (ref 180–914)

## 2023-03-06 LAB — PARATHYROID HORMONE, INTACT (NO CA): PTH: 78 pg/mL — ABNORMAL HIGH (ref 15–65)

## 2023-05-03 ENCOUNTER — Telehealth: Payer: Medicare HMO | Admitting: Physician Assistant

## 2023-05-03 DIAGNOSIS — R0981 Nasal congestion: Secondary | ICD-10-CM

## 2023-05-03 MED ORDER — IPRATROPIUM BROMIDE 0.03 % NA SOLN: 2.0000 | Freq: Two times a day (BID) | NASAL | 12 refills | Status: DC

## 2023-05-03 NOTE — Progress Notes (Signed)
Virtual Visit Consent   Corey [name], you are scheduled for a virtual visit with a [place] provider today. Just as with appointments in the office, your consent must be obtained to participate. Your consent will be active for this visit and any virtual visit you may have with one of our providers in the next 365 days. If you have a MyChart account, a copy of this consent can be sent to you electronically.  As this is a virtual visit, video technology does not allow for your provider to perform a traditional examination. This may limit your provider's ability to fully assess your condition. If your provider identifies any concerns that need to be evaluated in person or the need to arrange testing (such as labs, EKG, etc.), we will make arrangements to do so. Although advances in technology are sophisticated, we cannot ensure that it will always work on either your end or our end. If the connection with a video visit is poor, the visit may have to be switched to a telephone visit. With either a video or telephone visit, we are not always able to ensure that we have a secure connection.  By engaging in this virtual visit, you consent to the provision of healthcare and authorize for your insurance to be billed (if applicable) for the services provided during this visit. Depending on your insurance coverage, you may receive a charge related to this service.  I need to obtain your verbal consent now. Are you willing to proceed with your visit today? Corey [name] has provided verbal consent on 05/03/2023 for a virtual visit (video or telephone). Corey Fantasia Ward, PA-C  Date: 05/03/2023 7:08 PM  Virtual Visit via Video Note   I, Corey [name], connected with  Corey [name]  ([id number], April 21, 1936) on 05/03/23 at  7:00 PM EST by a video-enabled telemedicine application and verified that I am speaking with the correct person using two identifiers.  Location: Patient: Virtual Visit Location  Patient: Home Provider: Virtual Visit Location Provider: Home Office   I discussed the limitations of evaluation and management by telemedicine and the availability of in person appointments. The patient expressed understanding and agreed to proceed.    History of Present Illness: Corey [name] is a 87 y.o. who identifies as a male who was assigned male at birth, and is being seen today for congestion that started two days ago.  Pt denies cough, fever, chills, shortness of breath, wheezing.  He has been using Vick's salve with some relief. Reports trouble sleeping due to not being able to breathe out of his nose.   HPI: HPI  Problems:  Patient Active Problem List   Diagnosis Date Noted   Peripheral arterial disease (HCC) 03/11/2022   Visit for wound check 11/14/2019   S/P CABG x 2 10/14/2019   Coronary artery disease 10/14/2019   Non-STEMI (non-ST elevated myocardial infarction) (HCC) 10/09/2019   NSTEMI (non-ST elevated myocardial infarction) (HCC) 10/09/2019   Chronic combined systolic and diastolic heart failure (HCC)    Pure hypercholesterolemia    Exertional angina (HCC) 05/18/2014   Abnormal myocardial perfusion study 05/16/2014   Accelerating angina (HCC) 05/16/2014   Screening for colon cancer 04/10/2011   Mixed hyperlipidemia 08/09/2009   Essential hypertension 08/09/2009   CORONARY ATHEROSCLEROSIS NATIVE CORONARY ARTERY 08/09/2009    Allergies:  Allergies  Allergen Reactions   Aspirin Anaphylaxis   Other     Foods that are high in acid content cause sores and a  rash.    Penicillins Nausea And Vomiting   Medications:  Current Outpatient Medications:    ipratropium (ATROVENT) 0.03 % nasal spray, Place 2 sprays into both nostrils every 12 (twelve) hours., Disp: 30 mL, Rfl: 12   Ascorbic Acid (VITAMIN C) 1000 MG tablet, Take 1,000 mg by mouth daily., Disp: , Rfl:    atorvastatin (LIPITOR) 80 MG tablet, Take 1 tablet daily at 6 pm., Disp: 90 tablet, Rfl: 3    carvedilol (COREG) 12.5 MG tablet, Take 1 tablet by mouth twice daily, Disp: 180 tablet, Rfl: 2   cilostazol (PLETAL) 50 MG tablet, Take 1 tablet by mouth twice daily, Disp: 180 tablet, Rfl: 1   clopidogrel (PLAVIX) 75 MG tablet, TAKE 1 TABLET BY MOUTH ONCE DAILY - NEED FOLLOW UP FOR REFILLS, Disp: 90 tablet, Rfl: 3   dapagliflozin propanediol (FARXIGA) 10 MG TABS tablet, Take 1 tablet (10 mg total) by mouth daily before breakfast., Disp: 30 tablet, Rfl: 11   ezetimibe (ZETIA) 10 MG tablet, Take 1 tablet by mouth once daily, Disp: 90 tablet, Rfl: 3   furosemide (LASIX) 20 MG tablet, Take 1 tablet (20 mg total) by mouth every other day., Disp: 45 tablet, Rfl: 3   hydrALAZINE (APRESOLINE) 50 MG tablet, Take 50 mg by mouth 2 (two) times daily., Disp: , Rfl:    Omega-3 Fatty Acids (FISH OIL) 1000 MG CAPS, Take 1,000 mg by mouth 2 (two) times daily., Disp: , Rfl:    sacubitril-valsartan (ENTRESTO) 97-103 MG, Take 1 tablet by mouth twice daily, Disp: 180 tablet, Rfl: 3   spironolactone (ALDACTONE) 25 MG tablet, Take 25 mg by mouth every other day., Disp: , Rfl:    vitamin E 1000 UNIT capsule, Take 1 capsule (1,000 Units total) by mouth daily., Disp:  , Rfl:   Observations/Objective: Patient is well-developed, well-nourished in no acute distress.  Resting comfortably at home.  Head is normocephalic, atraumatic.  No labored breathing.  Speech is clear and coherent with logical content.  Patient is alert and oriented at baseline.    Assessment and Plan: 1. Nasal congestion  Atrovent nasal spray prescribed Supportive care discussed.   Follow Up Instructions: I discussed the assessment and treatment plan with the patient. The patient was provided an opportunity to ask questions and all were answered. The patient agreed with the plan and demonstrated an understanding of the instructions.  A copy of instructions were sent to the patient via MyChart unless otherwise noted below.     The patient was  advised to call back or seek an in-person evaluation if the symptoms worsen or if the condition fails to improve as anticipated.    Corey Fantasia Ward, PA-C

## 2023-05-03 NOTE — Patient Instructions (Signed)
Corey [name], thank you for joining Tylene Fantasia Ward, PA-C for today's virtual visit.  While this provider is not your primary care provider (PCP), if your PCP is located in our provider database this encounter information will be shared with them immediately following your visit.   A [facility] MyChart account gives you access to today's visit and all your visits, tests, and labs performed at [facility] " click here if you don't have a [facility] MyChart account or go to [email or web address]  Consent: (Patient) Corey [name] provided verbal consent for this virtual visit at the beginning of the encounter.  Current Medications:  Current Outpatient Medications:    ipratropium (ATROVENT) 0.03 % nasal spray, Place 2 sprays into both nostrils every 12 (twelve) hours., Disp: 30 mL, Rfl: 12   Ascorbic Acid (VITAMIN C) 1000 MG tablet, Take 1,000 mg by mouth daily., Disp: , Rfl:    atorvastatin (LIPITOR) 80 MG tablet, Take 1 tablet daily at 6 pm., Disp: 90 tablet, Rfl: 3   carvedilol (COREG) 12.5 MG tablet, Take 1 tablet by mouth twice daily, Disp: 180 tablet, Rfl: 2   cilostazol (PLETAL) 50 MG tablet, Take 1 tablet by mouth twice daily, Disp: 180 tablet, Rfl: 1   clopidogrel (PLAVIX) 75 MG tablet, TAKE 1 TABLET BY MOUTH ONCE DAILY - NEED FOLLOW UP FOR REFILLS, Disp: 90 tablet, Rfl: 3   dapagliflozin propanediol (FARXIGA) 10 MG TABS tablet, Take 1 tablet (10 mg total) by mouth daily before breakfast., Disp: 30 tablet, Rfl: 11   ezetimibe (ZETIA) 10 MG tablet, Take 1 tablet by mouth once daily, Disp: 90 tablet, Rfl: 3   furosemide (LASIX) 20 MG tablet, Take 1 tablet (20 mg total) by mouth every other day., Disp: 45 tablet, Rfl: 3   hydrALAZINE (APRESOLINE) 50 MG tablet, Take 50 mg by mouth 2 (two) times daily., Disp: , Rfl:    Omega-3 Fatty Acids (FISH OIL) 1000 MG CAPS, Take 1,000 mg by mouth 2 (two) times daily., Disp: , Rfl:    sacubitril-valsartan (ENTRESTO) 97-103 MG,  Take 1 tablet by mouth twice daily, Disp: 180 tablet, Rfl: 3   spironolactone (ALDACTONE) 25 MG tablet, Take 25 mg by mouth every other day., Disp: , Rfl:    vitamin E 1000 UNIT capsule, Take 1 capsule (1,000 Units total) by mouth daily., Disp:  , Rfl:    Medications ordered in this encounter:  Meds ordered this encounter  Medications   DISCONTD: ipratropium (ATROVENT) 0.03 % nasal spray    Sig: Place 2 sprays into both nostrils every 12 (twelve) hours.    Dispense:  30 mL    Refill:  12    Order Specific Question:   Supervising Provider    Answer:   Merrilee Jansky [[id number]]   ipratropium (ATROVENT) 0.03 % nasal spray    Sig: Place 2 sprays into both nostrils every 12 (twelve) hours.    Dispense:  30 mL    Refill:  12    Order Specific Question:   Supervising Provider    Answer:   Merrilee Jansky [id number]     *If you need refills on other medications prior to your next appointment, please contact your pharmacy*  Follow-Up: Call back or seek an in-person evaluation if the symptoms worsen or if the condition fails to improve as anticipated.  [facility] [phone]  Other Instructions Recommend Coricidin cold medication.  Recommend plain Mucinex, without the decongestant Drink plenty of water Can  use nasal spray that was sent to the pharmacy If symptoms become worse follow up with urgent care or your primary care physician    If you have been instructed to have an in-person evaluation today at a local Urgent Care facility, please use the link below. It will take you to a list of all of our available [facility], including address, phone number and hours of operation. Please do not delay care.  [facility]  If you or a family member do not have a primary care provider, use the link below to schedule a visit and establish care. When you choose a [facility] primary care physician or advanced practice provider, you gain a long-term  partner in health. Find a Primary Care Provider  Learn more about [facility]'s in-office and virtual care options:  - Get Care Now

## 2023-05-15 ENCOUNTER — Other Ambulatory Visit: Payer: Self-pay | Admitting: Physician Assistant

## 2023-05-15 ENCOUNTER — Other Ambulatory Visit: Payer: Self-pay | Admitting: Cardiovascular Disease

## 2023-06-02 ENCOUNTER — Other Ambulatory Visit (HOSPITAL_COMMUNITY)
Admission: RE | Admit: 2023-06-02 | Discharge: 2023-06-02 | Disposition: A | Discharge status: Home / Self Care | Payer: Medicare HMO | Source: Ambulatory Visit | Attending: Nephrology | Admitting: Nephrology

## 2023-06-02 DIAGNOSIS — N189 Chronic kidney disease, unspecified: Secondary | ICD-10-CM | POA: Diagnosis present

## 2023-06-02 LAB — RENAL FUNCTION PANEL
Albumin: 3.8 g/dL (ref 3.5–5.0)
Anion gap: 11 (ref 5–15)
BUN: 28 mg/dL — ABNORMAL HIGH (ref 8–23)
CO2: 20 mmol/L — ABNORMAL LOW (ref 22–32)
Calcium: 9.3 mg/dL (ref 8.9–10.3)
Chloride: 104 mmol/L (ref 98–111)
Creatinine, Ser: 1.92 mg/dL — ABNORMAL HIGH (ref 0.61–1.24)
GFR, Estimated: 34 mL/min — ABNORMAL LOW (ref >60)
Glucose, Bld: 94 mg/dL (ref 70–99)
Phosphorus: 3.1 mg/dL (ref 2.5–4.6)
Potassium: 4.5 mmol/L (ref 3.5–5.1)
Sodium: 135 mmol/L (ref 135–145)

## 2023-06-02 LAB — PROTEIN / CREATININE RATIO, URINE
Creatinine, Urine: 28 mg/dL
Protein Creatinine Ratio: 0.25 mg/mg{creat} — ABNORMAL HIGH (ref 0.00–0.15)
Total Protein, Urine: 7 mg/dL

## 2023-06-02 LAB — CBC
HCT: 39.1 % (ref 39.0–52.0)
Hemoglobin: 12.7 g/dL — ABNORMAL LOW (ref 13.0–17.0)
MCH: 28 pg (ref 26.0–34.0)
MCHC: 32.5 g/dL (ref 30.0–36.0)
MCV: 86.3 fL (ref 80.0–100.0)
Platelets: 176 10*3/uL (ref 150–400)
RBC: 4.53 MIL/uL (ref 4.22–5.81)
RDW: 16 % — ABNORMAL HIGH (ref 11.5–15.5)
WBC: 5.3 10*3/uL (ref 4.0–10.5)
nRBC: 0 % (ref 0.0–0.2)

## 2023-06-05 IMAGING — DX DG CHEST 2V
2 series · 2 of 2 positions shown · non-contrast
Comparison: 11/23/2019

CLINICAL DATA: 85-year-old male with a history of shortness of
breath

EXAM:
CHEST - 2 VIEW

[chest pa]
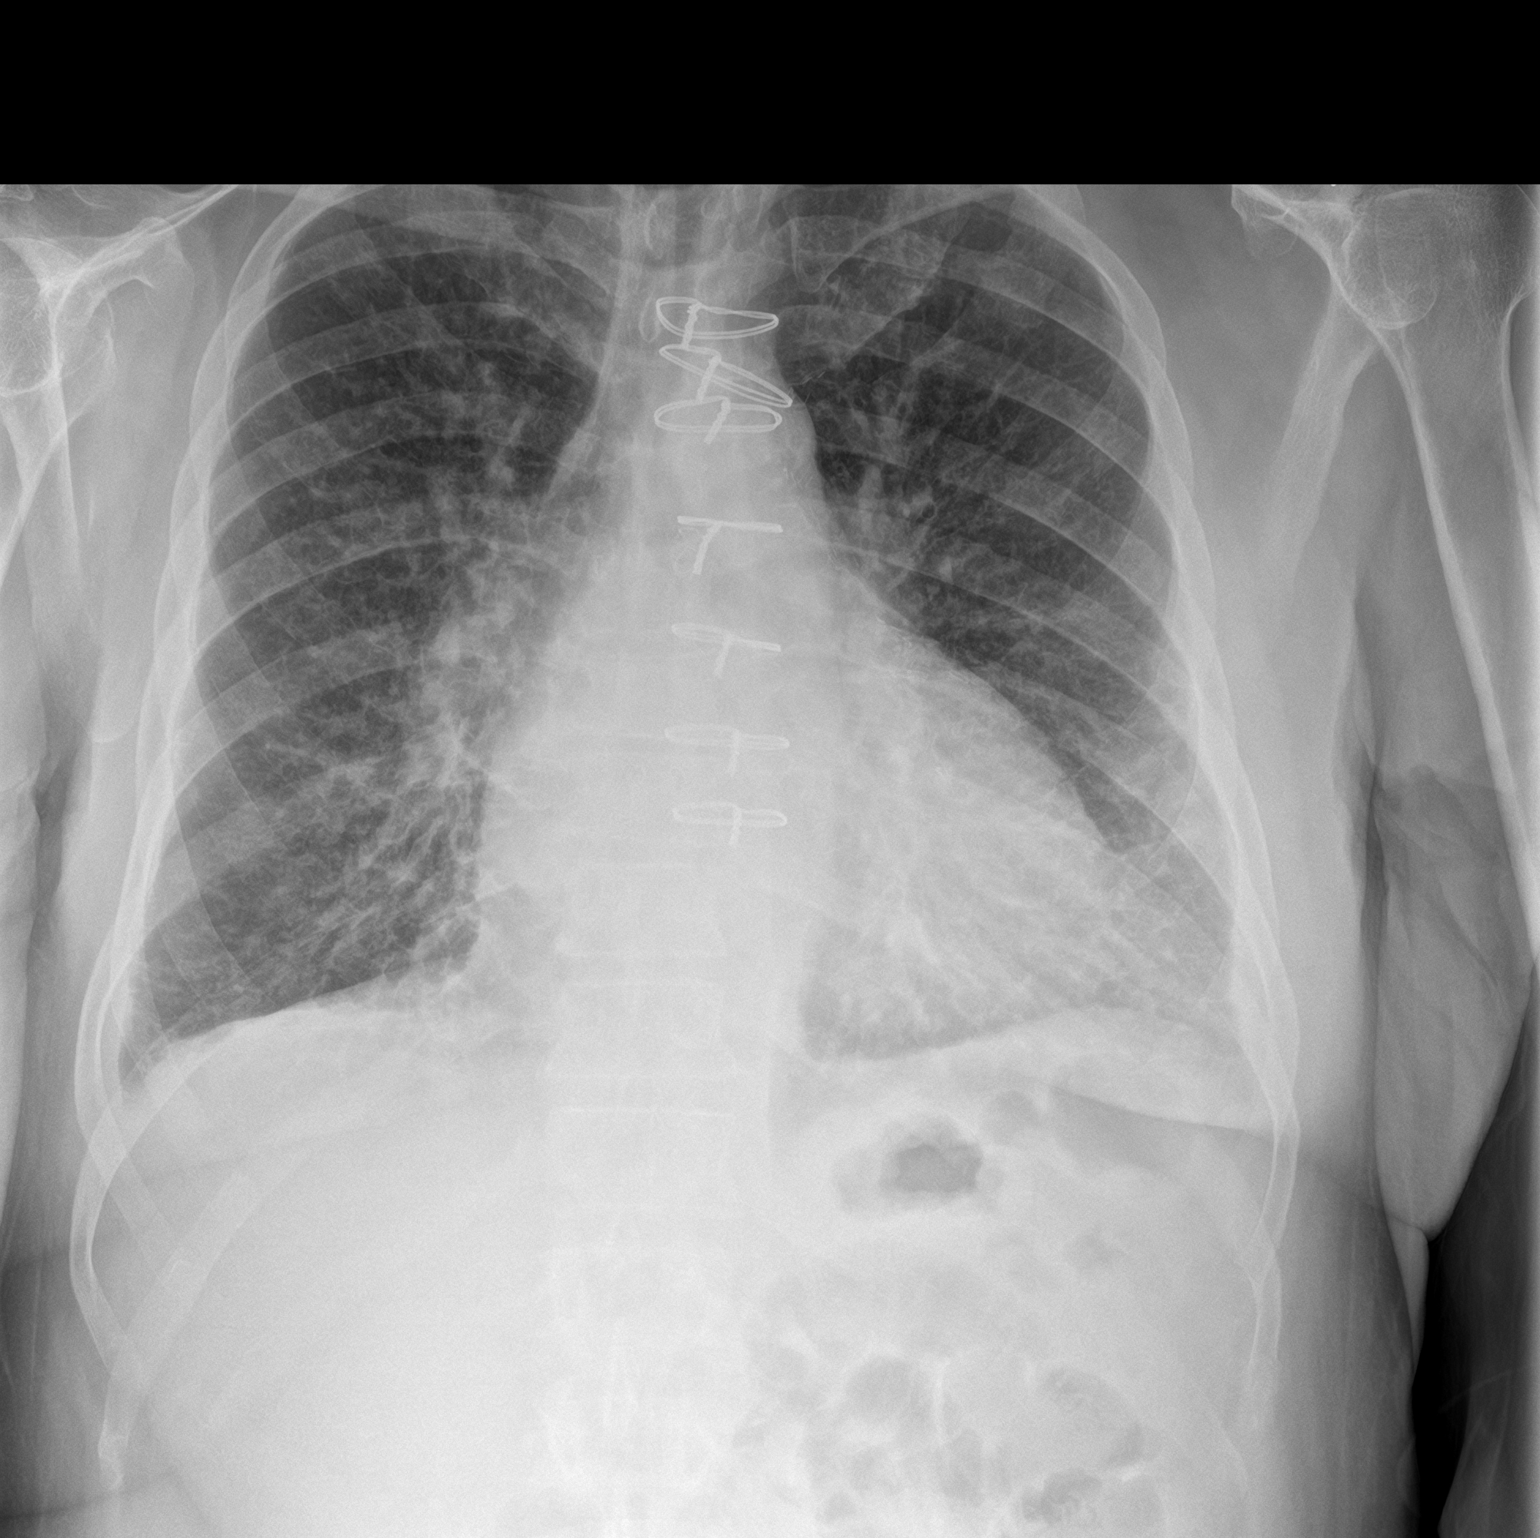

[chest lat]
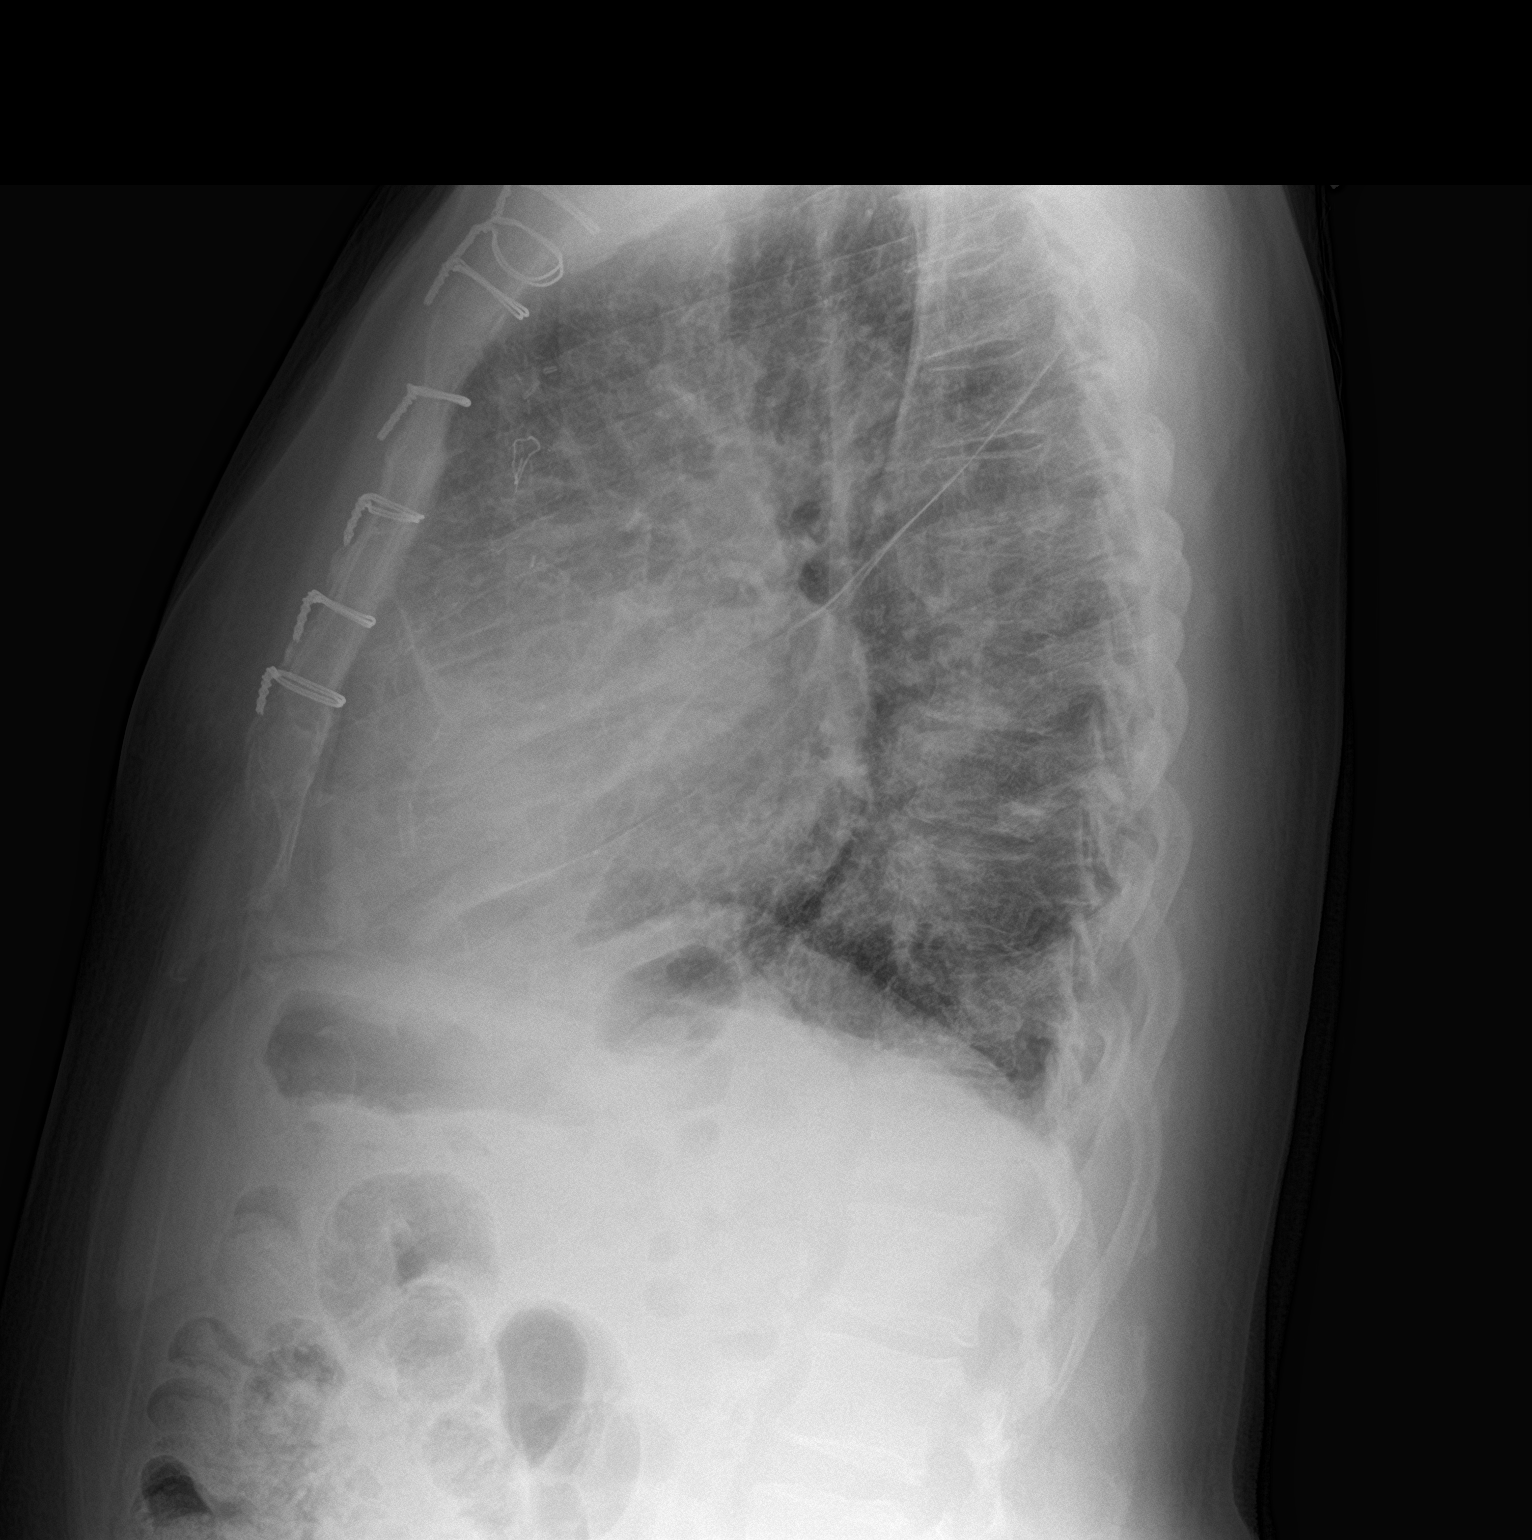

[2 of 2 positions shown; findings below may reference images not displayed]

FINDINGS: Cardiac diameter enlarged when compared to the prior plain film.
Surgical changes of median sternotomy and CABG.

Interlobular septal thickening with reticulonodular opacities of the
lungs.

Blunting of the bilateral costophrenic angles.

No pneumothorax

No confluent airspace disease.

No displaced fracture.  Degenerative changes of the spine.
IMPRESSION: New cardiomegaly and interlobular septal thickening, compatible with
acute CHF.

Surgical changes of median sternotomy and CABG.

## 2023-07-18 ENCOUNTER — Other Ambulatory Visit: Payer: Self-pay | Admitting: Cardiology

## 2023-07-22 ENCOUNTER — Ambulatory Visit: Payer: Medicare HMO | Attending: Cardiology | Admitting: Cardiology

## 2023-07-22 ENCOUNTER — Encounter: Payer: Self-pay | Admitting: Cardiology

## 2023-07-22 VITALS — BP 138/68 | HR 65 | Ht 68.0 in | Wt 233.0 lb

## 2023-07-22 DIAGNOSIS — I25119 Atherosclerotic heart disease of native coronary artery with unspecified angina pectoris: Secondary | ICD-10-CM

## 2023-07-22 DIAGNOSIS — I5020 Unspecified systolic (congestive) heart failure: Secondary | ICD-10-CM | POA: Diagnosis not present

## 2023-07-22 DIAGNOSIS — E782 Mixed hyperlipidemia: Secondary | ICD-10-CM | POA: Diagnosis not present

## 2023-07-22 DIAGNOSIS — N1832 Chronic kidney disease, stage 3b: Secondary | ICD-10-CM | POA: Diagnosis not present

## 2023-07-22 NOTE — Patient Instructions (Signed)
Medication Instructions:  Your physician recommends that you continue on your current medications as directed. Please refer to the Current Medication list given to you today.   Labwork: None today  Testing/Procedures: None today  Follow-Up: 6 months  Any Other Special Instructions Will Be Listed Below (If Applicable).  If you need a refill on your cardiac medications before your next appointment, please call your pharmacy.  

## 2023-07-22 NOTE — Progress Notes (Signed)
    Cardiology Office Note  Date: 07/22/2023   ID: FINNEGAN CARKHUFF, DOB [birth year], MRN [id number]  History of Present Illness: Corey [name] is an 88 y.o. male last seen in July 2024.  He is here for a follow-up visit.  Reports NYHA class II dyspnea, no definite fluid retention or increasing weight.  Still goes to the [employer] 4 to 5 days a week (treadmill, stationary bike, weight machines).  I reviewed his medications.  Current regimen includes Coreg, Aldactone, Entresto, Farxiga, hydralazine, Lipitor, Plavix, and Lasix.  He had recent lab work in December 2020 for which I reviewed as well.  He continues to follow with nephrology.  Echocardiogram from August 2024 showed improvement in LVEF to the range of 35 to 40%.  We do not plan to pursue ICD as discussed previously.  Physical Exam: VS:  BP 138/68   Pulse 65   Ht 5\' 8"  (1.727 m)   Wt 233 lb (105.7 kg)   SpO2 94%   BMI 35.43 kg/m , BMI Body mass index is 35.43 kg/m.  Wt Readings from Last 3 Encounters:  07/22/23 233 lb (105.7 kg)  01/14/23 231 lb (104.8 kg)  12/26/22 225 lb (102.1 kg)    General: Patient appears comfortable at rest. HEENT: Conjunctiva and lids normal. Neck: Supple, no elevated JVP or carotid bruits. Lungs: Clear to auscultation, nonlabored breathing at rest. Cardiac: Regular rate and rhythm, no S3, 2/6 systolic murmur. Extremities: No pitting edema.  ECG:  An ECG dated 12/26/2022 was personally reviewed today and demonstrated:  Sinus rhythm with ventricular trigeminy and IVCD of left bundle branch block type.  Labwork: 12/26/2022: Magnesium 2.0 06/02/2023: BUN 28; Creatinine, Ser 1.92; Hemoglobin 12.7; Platelets 176; Potassium 4.5; Sodium 135     Component Value Date/Time   CHOL 101 11/11/2021 1108   TRIG 102 11/11/2021 1108   HDL 35 (L) 11/11/2021 1108   CHOLHDL 2.9 11/11/2021 1108   VLDL 20 11/11/2021 1108   LDLCALC 46 11/11/2021 1108   LDLDIRECT 60.8 11/11/2021 1108   Other Studies Reviewed  Today:  No interval cardiac testing for review today.  Assessment and Plan:  1.  CAD status post BMS to the RCA and circumflex in 2009, DES to the first diagonal and RCA in 2015, and subsequent CABG in 2021 with LIMA to LAD and SVG to first diagonal.  No angina at current level of activity including his regular exercise plan.  Continue Plavix and Lipitor.   2.  Primary hypertension.  Systolic is in the 130s today, no changes to current regimen.  Continue Entresto, hydralazine, and Coreg.   3.  Mixed hyperlipidemia.  He remains on Lipitor, last LDL 46.  He is following lab work with PCP.   4.  HFrEF with mixed cardiomyopathy.  LVEF 35 to 40% by follow-up echocardiogram in August 2024, improved compared with prior assessment.  Symptomatically stable with NYHA class II dyspnea and no fluid retention.  Continue Coreg, Farxiga, Aldactone, Entresto, and Lasix.   5.  CKD stage IIIb.  Creatinine down to 1.92 with GFR 34 in December 2024.  He continues to follow with nephrology.  Disposition:  Follow up  6 months.  Signed, Jonelle Sidle, M.D., F.A.C.C. [facility]

## 2023-08-03 NOTE — Patient Instructions (Signed)
 Corey [name]  08/03/2023     @PREFPERIOPPHARMACY @   Your procedure is scheduled on 08/07/2023.  Report to [facility] at 0700 A.M.  Call this number if you have problems the morning of surgery:  [phone]  If you experience any cold or flu symptoms such as cough, fever, chills, shortness of breath, etc. between now and your scheduled surgery, please notify us  at the above number.   Remember:  Do not eat or drink after midnight.  You may drink clear liquids until 0200 .  Clear liquids allowed are:                    Water     Take these medicines the morning of surgery with A SIP OF WATER     Lipitor , coreg , pletal , plavix , zetia , hydralazine     Do not wear jewelry, make-up or nail polish, including gel polish,  artificial nails, or any other type of covering on natural nails (fingers and  toes).  Do not wear lotions, powders, or perfumes, or deodorant.  Do not shave 48 hours prior to surgery.  Men may shave face and neck.  Do not bring valuables to the hospital.  [facility] is not responsible for any belongings or valuables.  Contacts, dentures or bridgework may not be worn into surgery.  Leave your suitcase in the car.  After surgery it may be brought to your room.  For patients admitted to the hospital, discharge time will be determined by your treatment team.  Patients discharged the day of surgery will not be allowed to drive home.    Please read over the following fact sheets that you were given. Cataract Surgery, Care After The following information offers guidance on how to care for yourself after your procedure. Your health care provider may also give you more specific instructions. If you have problems or questions, contact your health care provider. What can I expect after the procedure? After the procedure, it is common to have: Itching. Discomfort. Discharge of fluid from the eye. Sensitivity to light and touch. Bruising or redness in or around the  eye. Mild blurred vision. Follow these instructions at home: Eye care  Do not touch or rub your eyes. Protect your eyes as told by your health care provider. You may be told to wear a protective eye shield, patch, or sunglasses. Do not put a contact lens into the affected eye or eyes until your health care provider approves. Keep the area around your eye clean and dry. To do this: Avoid swimming until your health care provider approves. Do not allow water  to hit you directly in the face while showering. Keep soap and shampoo out of your eyes. Check your eye every day for signs of infection. Watch for: More redness, swelling, or pain that is getting worse. More fluid, blood, or pus. Vision or sensitivity that is getting worse. Activity If you were given a sedative during the procedure, it can affect you for several hours. Do not drive or operate machinery until your health care provider says that it is safe. Avoid activities that take a lot of effort, such as playing contact sports, for as long as told by your health care provider. Do not bend over. Bending increases pressure in the eye. You can walk, climb stairs, and do light household chores. You may have to avoid lifting. Ask your health care provider how much you can safely lift. Ask your health care provider when you can return  to work. If you work in a dusty environment, you may be advised to wear protective eyewear for a period of time. Return to your normal activities as told by your health care provider. Ask your health care provider what activities are safe for you. General instructions Take or apply over-the-counter and prescription medicines only as told by your health care provider. This includes eye drops. Do not use any products that contain nicotine or tobacco. These products include cigarettes, chewing tobacco, and vaping devices, such as e-cigarettes. These can delay healing after surgery. If you need help quitting, ask  your health care provider. Keep all follow-up visits. This is important. Contact a health care provider if: You have pain that is not helped with medicine. You have a fever. You have increasing redness, swelling, or pain in your eye. You have more fluid or blood, or notice pus coming from your incision. Your vision or sensitivity to light gets worse. You develop nausea or vomiting. Get help right away if: You see flashes of light or spots (floaters). You have severe eye pain. You have sudden loss of vision. Summary After your procedure, it is common to have itching, discomfort, bruising, fluid discharge, or sensitivity to light. Follow instructions from your health care provider about caring for your eye after the procedure. Do not rub your eye after the procedure. You may need to wear eye protection or sunglasses. Avoid activities that take a lot of effort, such as playing contact sports, for as long as told by your health care provider. Get help right away if you suddenly lose your vision, see flashes of light or spots, or have severe pain in the eye. This information is not intended to replace advice given to you by your health care provider. Make sure you discuss any questions you have with your health care provider. Document Revised: 01/29/2021 Document Reviewed: 01/16/2021 Elsevier Patient Education  2024 [employer].

## 2023-08-04 ENCOUNTER — Other Ambulatory Visit: Payer: Self-pay

## 2023-08-04 ENCOUNTER — Encounter (HOSPITAL_COMMUNITY): Payer: Self-pay

## 2023-08-04 ENCOUNTER — Encounter (HOSPITAL_COMMUNITY)
Admission: RE | Admit: 2023-08-04 | Discharge: 2023-08-04 | Disposition: A | Discharge status: Home / Self Care | Payer: Medicare HMO | Source: Ambulatory Visit | Attending: Ophthalmology | Admitting: Ophthalmology

## 2023-08-04 VITALS — Ht 68.0 in | Wt 233.0 lb

## 2023-08-04 DIAGNOSIS — R3589 Other polyuria: Secondary | ICD-10-CM

## 2023-08-04 DIAGNOSIS — I10 Essential (primary) hypertension: Secondary | ICD-10-CM

## 2023-08-05 NOTE — H&P (Signed)
 Surgical History & Physical  Patient Name: Corey [name]  DOB: 11-22-1935  Surgery: Cataract extraction with intraocular lens implant phacoemulsification; Right Eye Surgeon: Lynwood Hermann MD Surgery Date: 08/07/2023 Pre-Op Date: 07/23/2023  HPI: A 72 Yr. old male patient present for cataract eval per Dr. Darroll. 1. The patient complains of difficulty when reading fine print, blurred vision, which began 6 months ago. Both eyes are affected, OS>OD. The episode is constant. The condition's severity is worsening. The patient has trouble seeing faces and reading in low light. This is negatively affecting the patient's quality of life and the patient is unable to function adequately in life with the current level of vision. HPI Completed by Dr. Lynwood Hermann  Medical History: Cataracts  Heart Problem High Blood Pressure LDL  Review of Systems Cardiovascular High Blood Pressure, 2 heart attacks All recorded systems are negative except as noted above.  Social Former smoker   Medication Prednisolone acetate, Prolensa, Moxifloxacin ,  atorvastatin  ,  cilostazol  ,  clopidogrel  ,  hydralazine  ,  carvedilol  ,  ipratropium bromide  ,  ezetimibe  ,  spironolactone ,  Farxiga  ,  furosemide  ,  Entresto    Sx/Procedures Open Heart Sx  Drug Allergies  penicillin ,  aspirin   History & Physical: Heent: cataracts NECK: supple without bruits LUNGS: lungs clear to auscultation CV: regular rate and rhythm Abdomen: soft and non-tender  Impression & Plan: Assessment: 1.  COMBINED FORMS AGE RELATED CATARACT; Both Eyes (H25.813) 2.  BLEPHARITIS; Right Upper Lid, Right Lower Lid, Left Upper Lid, Left Lower Lid (H01.001, H01.002,H01.004,H01.005) 3.  Pinguecula; Both Eyes (H11.153) 4.  VITREOUS DETACHMENT PVD; Left Eye [phone])  Plan: 1.  Cataract accounts for the patient's decreased vision. This visual impairment is not correctable with a tolerable change in glasses or contact lenses. Cataract  surgery with an implantation of a new lens should significantly improve the visual and functional status of the patient. Discussed all risks, benefits, alternatives, and potential complications. Discussed the procedures and recovery. Patient desires to have surgery. A-scan ordered and performed today for intra-ocular lens calculations. The surgery will be performed in order to improve vision for driving, reading, and for eye examinations. Recommend phacoemulsification with intra-ocular lens. Recommend Dextenza  for post-operative pain and inflammation. Right Eye non-dominant - first.. Dilates poorly - shugarcaine by protocol. Malyugin Ring. Omidira.  2.  Blepharitis is present - recommend regular lid cleaning.  3.  Observe; Artificial tears as needed for irritation.  4.  Old Symptomatic. RD precautions given. Patient to call with increase in flashing lights/floaters/dark curtain.

## 2023-08-07 ENCOUNTER — Ambulatory Visit (HOSPITAL_BASED_OUTPATIENT_CLINIC_OR_DEPARTMENT_OTHER): Payer: Medicare HMO | Admitting: Anesthesiology

## 2023-08-07 ENCOUNTER — Ambulatory Visit (HOSPITAL_COMMUNITY)
Admission: RE | Admit: 2023-08-07 | Discharge: 2023-08-07 | Disposition: A | Discharge status: Home / Self Care | Payer: Medicare HMO | Attending: Ophthalmology | Admitting: Ophthalmology

## 2023-08-07 ENCOUNTER — Encounter (HOSPITAL_COMMUNITY): Payer: Self-pay | Admitting: Ophthalmology

## 2023-08-07 ENCOUNTER — Encounter (HOSPITAL_COMMUNITY)
Admission: RE | Disposition: A | Discharge status: Home / Self Care | Payer: Self-pay | Source: Home / Self Care | Attending: Ophthalmology

## 2023-08-07 ENCOUNTER — Ambulatory Visit (HOSPITAL_COMMUNITY): Payer: Medicare HMO | Admitting: Anesthesiology

## 2023-08-07 DIAGNOSIS — H25811 Combined forms of age-related cataract, right eye: Secondary | ICD-10-CM

## 2023-08-07 DIAGNOSIS — H0100B Unspecified blepharitis left eye, upper and lower eyelids: Secondary | ICD-10-CM | POA: Diagnosis not present

## 2023-08-07 DIAGNOSIS — I739 Peripheral vascular disease, unspecified: Secondary | ICD-10-CM | POA: Insufficient documentation

## 2023-08-07 DIAGNOSIS — H11153 Pinguecula, bilateral: Secondary | ICD-10-CM | POA: Insufficient documentation

## 2023-08-07 DIAGNOSIS — H43812 Vitreous degeneration, left eye: Secondary | ICD-10-CM | POA: Diagnosis not present

## 2023-08-07 DIAGNOSIS — I130 Hypertensive heart and chronic kidney disease with heart failure and stage 1 through stage 4 chronic kidney disease, or unspecified chronic kidney disease: Secondary | ICD-10-CM | POA: Diagnosis not present

## 2023-08-07 DIAGNOSIS — I25119 Atherosclerotic heart disease of native coronary artery with unspecified angina pectoris: Secondary | ICD-10-CM | POA: Diagnosis not present

## 2023-08-07 DIAGNOSIS — I5042 Chronic combined systolic (congestive) and diastolic (congestive) heart failure: Secondary | ICD-10-CM | POA: Diagnosis not present

## 2023-08-07 DIAGNOSIS — H0100A Unspecified blepharitis right eye, upper and lower eyelids: Secondary | ICD-10-CM | POA: Diagnosis not present

## 2023-08-07 DIAGNOSIS — Z87891 Personal history of nicotine dependence: Secondary | ICD-10-CM | POA: Insufficient documentation

## 2023-08-07 DIAGNOSIS — Z951 Presence of aortocoronary bypass graft: Secondary | ICD-10-CM | POA: Diagnosis not present

## 2023-08-07 DIAGNOSIS — I252 Old myocardial infarction: Secondary | ICD-10-CM | POA: Diagnosis not present

## 2023-08-07 DIAGNOSIS — I110 Hypertensive heart disease with heart failure: Secondary | ICD-10-CM | POA: Diagnosis not present

## 2023-08-07 DIAGNOSIS — I5032 Chronic diastolic (congestive) heart failure: Secondary | ICD-10-CM | POA: Diagnosis not present

## 2023-08-07 DIAGNOSIS — N1832 Chronic kidney disease, stage 3b: Secondary | ICD-10-CM | POA: Diagnosis not present

## 2023-08-07 HISTORY — PX: CATARACT EXTRACTION W/PHACO: SHX586

## 2023-08-07 SURGERY — PHACOEMULSIFICATION, CATARACT, WITH IOL INSERTION
Anesthesia: Monitor Anesthesia Care | Site: Eye | Laterality: Right

## 2023-08-07 MED ORDER — POVIDONE-IODINE 5 % OP SOLN
OPHTHALMIC | Status: DC | PRN
  Administered 2023-08-07: 1 via OPHTHALMIC

## 2023-08-07 MED ORDER — SODIUM CHLORIDE 0.9% FLUSH: 3.0000 mL | INTRAVENOUS | Status: DC | PRN

## 2023-08-07 MED ORDER — TROPICAMIDE 1 % OP SOLN
1.0000 [drp] | OPHTHALMIC | Status: AC | PRN
  Administered 2023-08-07 (×3): 1 [drp] via OPHTHALMIC

## 2023-08-07 MED ORDER — LIDOCAINE HCL 3.5 % OP GEL
1.0000 | Freq: Once | OPHTHALMIC | Status: AC
  Administered 2023-08-07: 1 via OPHTHALMIC

## 2023-08-07 MED ORDER — SODIUM HYALURONATE 23MG/ML IO SOSY
PREFILLED_SYRINGE | INTRAOCULAR | Status: DC | PRN
  Administered 2023-08-07: .6 mL via INTRAOCULAR

## 2023-08-07 MED ORDER — PHENYLEPHRINE HCL 2.5 % OP SOLN
1.0000 [drp] | OPHTHALMIC | Status: AC | PRN
  Administered 2023-08-07 (×3): 1 [drp] via OPHTHALMIC

## 2023-08-07 MED ORDER — MOXIFLOXACIN HCL 5 MG/ML IO SOLN
INTRAOCULAR | Status: DC | PRN
  Administered 2023-08-07: .2 mL via INTRACAMERAL

## 2023-08-07 MED ORDER — SODIUM CHLORIDE 0.9% FLUSH
3.0000 mL | Freq: Two times a day (BID) | INTRAVENOUS | Status: DC
Start: 2023-08-07 — End: 2023-08-07

## 2023-08-07 MED ORDER — SODIUM HYALURONATE 10 MG/ML IO SOLUTION
PREFILLED_SYRINGE | INTRAOCULAR | Status: DC | PRN
  Administered 2023-08-07: .85 mL via INTRAOCULAR

## 2023-08-07 MED ORDER — PHENYLEPHRINE-KETOROLAC 1-0.3 % IO SOLN
INTRAOCULAR | Status: DC | PRN
  Administered 2023-08-07: 500 mL via OPHTHALMIC

## 2023-08-07 MED ORDER — LIDOCAINE HCL (PF) 1 % IJ SOLN
INTRAOCULAR | Status: DC | PRN
  Administered 2023-08-07: 1 mL via OPHTHALMIC

## 2023-08-07 MED ORDER — BSS IO SOLN
INTRAOCULAR | Status: DC | PRN
  Administered 2023-08-07: 15 mL via INTRAOCULAR

## 2023-08-07 MED ORDER — SODIUM CHLORIDE 0.9% FLUSH
3.0000 mL | INTRAVENOUS | Status: DC | PRN
  Administered 2023-08-07: 3 mL via INTRAVENOUS

## 2023-08-07 MED ORDER — MIDAZOLAM HCL 2 MG/2ML IJ SOLN
INTRAMUSCULAR | Status: DC | PRN
Start: 2023-08-07 — End: 2023-08-07
  Administered 2023-08-07: 1 mg via INTRAVENOUS

## 2023-08-07 MED ORDER — TETRACAINE HCL 0.5 % OP SOLN
1.0000 [drp] | OPHTHALMIC | Status: AC | PRN
  Administered 2023-08-07 (×3): 1 [drp] via OPHTHALMIC

## 2023-08-07 MED ORDER — MIDAZOLAM HCL 2 MG/2ML IJ SOLN
INTRAMUSCULAR | Status: AC
  Filled 2023-08-07: qty 2

## 2023-08-07 MED ORDER — STERILE WATER FOR IRRIGATION IR SOLN
Status: DC | PRN
  Administered 2023-08-07: 200 mL

## 2023-08-07 SURGICAL SUPPLY — 12 items
CATARACT SUITE SIGHTPATH (MISCELLANEOUS) ×1
CLOTH BEACON ORANGE TIMEOUT ST (SAFETY) ×1 IMPLANT
EYE SHIELD UNIVERSAL CLEAR (GAUZE/BANDAGES/DRESSINGS) IMPLANT
FEE CATARACT SUITE SIGHTPATH (MISCELLANEOUS) ×1 IMPLANT
GLOVE BIOGEL PI IND STRL 7.0 (GLOVE) ×2 IMPLANT
LENS IOL TECNIS EYHANCE 20.0 (Intraocular Lens) IMPLANT
NDL HYPO 18GX1.5 BLUNT FILL (NEEDLE) ×1 IMPLANT
NEEDLE HYPO 18GX1.5 BLUNT FILL (NEEDLE) ×1
PAD ARMBOARD 7.5X6 YLW CONV (MISCELLANEOUS) ×1 IMPLANT
SYR TB 1ML LL NO SAFETY (SYRINGE) ×1 IMPLANT
TAPE SURG TRANSPORE 1 IN (GAUZE/BANDAGES/DRESSINGS) IMPLANT
WATER STERILE IRR 250ML POUR (IV SOLUTION) ×1 IMPLANT

## 2023-08-07 NOTE — Anesthesia Postprocedure Evaluation (Signed)
 Anesthesia Post Note  Patient: Corey [name]  Procedure(s) Performed: CATARACT EXTRACTION PHACO AND INTRAOCULAR LENS PLACEMENT (IOC) (Right: Eye)  Patient location during evaluation: PACU Anesthesia Type: MAC Level of consciousness: awake and alert Pain management: pain level controlled Vital Signs Assessment: post-procedure vital signs reviewed and stable Respiratory status: spontaneous breathing, nonlabored ventilation, respiratory function stable and patient connected to nasal cannula oxygen Cardiovascular status: stable and blood pressure returned to baseline Postop Assessment: no apparent nausea or vomiting Anesthetic complications: no   There were no known notable events for this encounter.   Last Vitals:  Vitals:   08/07/23 0813 08/07/23 0921  BP: (!) 151/81 (!) 170/94  Pulse: 65 60  Resp: 12 14  Temp: 36.5 C 36.5 C  SpO2: 99% 100%    Last Pain:  Vitals:   08/07/23 0921  TempSrc: Oral  PainSc: 0-No pain                 Ronith Berti L Xzaiver Vayda

## 2023-08-07 NOTE — Interval H&P Note (Signed)
 History and Physical Interval Note:  08/07/2023 8:49 AM  Corey [name]  has presented today for surgery, with the diagnosis of combined forms age related cataract, right eye.  The various methods of treatment have been discussed with the patient and family. After consideration of risks, benefits and other options for treatment, the patient has consented to  Procedure(s) with comments: CATARACT EXTRACTION PHACO AND INTRAOCULAR LENS PLACEMENT (IOC) (Right) - CDE: as a surgical intervention.  The patient's history has been reviewed, patient examined, no change in status, stable for surgery.  I have reviewed the patient's chart and labs.  Questions were answered to the patient's satisfaction.     HARRIE AGENT

## 2023-08-07 NOTE — Anesthesia Preprocedure Evaluation (Addendum)
 Anesthesia Evaluation  Patient identified by MRN, date of birth, ID band Patient awake    Reviewed: Allergy & Precautions, H&P , NPO status , Patient's Chart, lab work & pertinent test results, reviewed documented beta blocker date and time   Airway Mallampati: III  TM Distance: >3 FB Neck ROM: full    Dental no notable dental hx. (+) Teeth Intact, Dental Advisory Given   Pulmonary former smoker   Pulmonary exam normal breath sounds clear to auscultation       Cardiovascular hypertension, + angina  + CAD, + Past MI, + CABG, + Peripheral Vascular Disease and +CHF  Normal cardiovascular exam Rhythm:regular Rate:Normal  EF 35 - 40% with grade 1 diastolic dysfunction   Neuro/Psych negative neurological ROS  negative psych ROS   GI/Hepatic negative GI ROS, Neg liver ROS,,,  Endo/Other  negative endocrine ROS    Renal/GU Renal diseaseStage 3b CKD  negative genitourinary   Musculoskeletal   Abdominal   Peds  Hematology negative hematology ROS (+)   Anesthesia Other Findings   Reproductive/Obstetrics negative OB ROS                             Anesthesia Physical Anesthesia Plan  ASA: 3  Anesthesia Plan: MAC   Post-op Pain Management: Minimal or no pain anticipated   Induction:   PONV Risk Score and Plan:   Airway Management Planned: Nasal Cannula and Natural Airway  Additional Equipment: None  Intra-op Plan:   Post-operative Plan:   Informed Consent: I have reviewed the patients History and Physical, chart, labs and discussed the procedure including the risks, benefits and alternatives for the proposed anesthesia with the patient or authorized representative who has indicated his/her understanding and acceptance.     Dental Advisory Given  Plan Discussed with: CRNA  Anesthesia Plan Comments:         Anesthesia Quick Evaluation

## 2023-08-07 NOTE — Transfer of Care (Signed)
 Immediate Anesthesia Transfer of Care Note  Patient: Corey [name]  Procedure(s) Performed: CATARACT EXTRACTION PHACO AND INTRAOCULAR LENS PLACEMENT (IOC) (Right: Eye)  Patient Location: Short Stay  Anesthesia Type:MAC  Level of Consciousness: awake and patient cooperative  Airway & Oxygen Therapy: Patient Spontanous Breathing  Post-op Assessment: Report given to RN and Post -op Vital signs reviewed and stable  Post vital signs: Reviewed and stable  Last Vitals:  Vitals Value Taken Time  BP 170/94 08/07/23 0921  Temp 36.5 C 08/07/23 0921  Pulse 60 08/07/23 0921  Resp 14 08/07/23 0921  SpO2 100 % 08/07/23 0921    Last Pain:  Vitals:   08/07/23 0921  TempSrc: Oral  PainSc: 0-No pain         Complications: No notable events documented.

## 2023-08-07 NOTE — Discharge Instructions (Addendum)
 Please discharge patient when stable, will follow up today with Dr. June Leap at the [facility] office immediately following discharge.  Leave shield in place until visit.  All paperwork with discharge instructions will be given at the office.  [facility] Address:  [address]  [place], [place] [address]

## 2023-08-07 NOTE — Anesthesia Procedure Notes (Signed)
 Date/Time: 08/07/2023 8:55 AM  Performed by: Barbarann Verneita RAMAN, CRNAPre-anesthesia Checklist: Patient identified, Emergency Drugs available, Suction available, Timeout performed and Patient being monitored Patient Re-evaluated:Patient Re-evaluated prior to induction Oxygen Delivery Method: Nasal Cannula

## 2023-08-07 NOTE — Op Note (Signed)
 Date of procedure: 08/07/23  Pre-operative diagnosis:  Visually significant combined form age-related cataract, Right Eye (H25.811)  Post-operative diagnosis:  Visually significant combined form age-related cataract, Right Eye (H25.811)  Procedure: Removal of cataract via phacoemulsification and insertion of intra-ocular lens [employer] DIB00 +20.0D into the capsular bag of the Right Eye  Attending surgeon: Lynwood LABOR. Fiora Weill, MD, MA  Anesthesia: MAC, Topical Akten   Complications: None  Estimated Blood Loss: <47mL (minimal)  Specimens: None  Implants: As above  Indications:  Visually significant age-related cataract, Right Eye  Procedure:  The patient was seen and identified in the pre-operative area. The operative eye was identified and dilated.  The operative eye was marked.  Topical anesthesia was administered to the operative eye.     The patient was then to the operative suite and placed in the supine position.  A timeout was performed confirming the patient, procedure to be performed, and all other relevant information.   The patient's face was prepped and draped in the usual fashion for intra-ocular surgery.  A lid speculum was placed into the operative eye and the surgical microscope moved into place and focused.  A superotemporal paracentesis was created using a 20 gauge paracentesis blade.  Shugarcaine was injected into the anterior chamber.  Viscoelastic was injected into the anterior chamber.  A temporal clear-corneal main wound incision was created using a 2.63mm microkeratome.  A continuous curvilinear capsulorrhexis was initiated using an irrigating cystitome and completed using capsulorrhexis forceps.  Hydrodissection and hydrodeliniation were performed.  Viscoelastic was injected into the anterior chamber.  A phacoemulsification handpiece and a chopper as a second instrument were used to remove the nucleus and epinucleus. The irrigation/aspiration handpiece was used to  remove any remaining cortical material.   The capsular bag was reinflated with viscoelastic, checked, and found to be intact.  The intraocular lens was inserted into the capsular bag.  The irrigation/aspiration handpiece was used to remove any remaining viscoelastic.  The clear corneal wound and paracentesis wounds were then hydrated and checked with Weck-Cels to be watertight. 0.1mL of Moxfloxacin was injected into the anterior chamber. The lid-speculum was removed.  The drape was removed.  The patient's face was cleaned with a wet and dry 4x4. A clear shield was taped over the eye. The patient was taken to the post-operative care unit in good condition, having tolerated the procedure well.  Post-Op Instructions: The patient will follow up at [facility] for a same day post-operative evaluation and will receive all other orders and instructions.

## 2023-08-10 ENCOUNTER — Encounter (HOSPITAL_COMMUNITY): Payer: Self-pay | Admitting: Ophthalmology

## 2023-08-15 ENCOUNTER — Other Ambulatory Visit: Payer: Self-pay | Admitting: Cardiovascular Disease

## 2023-08-17 NOTE — H&P (Signed)
Surgical History & Physical  Patient Name: Corey [name]  DOB: October 04, 1935  Surgery: Cataract extraction with intraocular lens implant phacoemulsification; Left Eye Surgeon: Fabio Pierce MD Surgery Date: 08/24/2023 Pre-Op Date: 08/13/2023  HPI: A 18 Yr. old male patient 1.  The patient is returning after cataract surgery for post-op OD pre-op OS. The right eye is affected. Status post cataract surgery, which began 6 days ago: Since the last visit, the affected area feels improvement. The patient's vision is improved. The condition's severity is constant. Patient is following medication instructions. Patient also here for blurred vision in the left eye. Patient is still having trouble seeing fine print. There is a large difference between the two eyes. He is scheduled to have cat sx os 08/24/23. This is negatively affecting the patient's quality of life and the patient is unable to function adequately in life with the current level of vision. HPI was performed by Fabio Pierce .  Medical History: Cataracts  Heart Problem High Blood Pressure LDL  Review of Systems Cardiovascular High Blood Pressure, 2 heart attacks All recorded systems are negative except as noted above.  Social Former smoker   Medication Prednisolone acetate, Prolensa, Moxifloxacin,  atorvastatin ,  cilostazol ,  clopidogrel ,  hydralazine ,  carvedilol ,  ipratropium bromide ,  ezetimibe ,  spironolactone ,  Farxiga ,  furosemide ,  Entresto   Sx/Procedures Phaco c IOL OD,  Open Heart Sx  Drug Allergies  penicillin ,  aspirin   History & Physical: Heent: cataract NECK: supple without bruits LUNGS: lungs clear to auscultation CV: regular rate and rhythm Abdomen: soft and non-tender  Impression & Plan: Assessment: 1.  CATARACT EXTRACTION STATUS; Right Eye (Z98.41) 2.  COMBINED FORMS AGE RELATED CATARACT; Left Eye (H25.812)  Plan: 1.  1 week after cataract surgery. Doing well with improved vision and  normal eye pressure. Call with any problems or concerns. Stop Vigamox. Continue Ilevro 1 drop 1x/day for 3 more weeks. Continue Pred Acetate 1 drop 2x/day for 3 more weeks.  2.  Cataract accounts for the patient's decreased vision. This visual impairment is not correctable with a tolerable change in glasses or contact lenses. Cataract surgery with an implantation of a new lens should significantly improve the visual and functional status of the patient. Discussed all risks, benefits, alternatives, and potential complications. Discussed the procedures and recovery. Patient desires to have surgery. A-scan ordered and performed today for intra-ocular lens calculations. The surgery will be performed in order to improve vision for driving, reading, and for eye examinations. Recommend phacoemulsification with intra-ocular lens. Recommend Dextenza for post-operative pain and inflammation. Left Eye. Surgery required to correct imbalance of vision. Dilates poorly - shugarcaine by protocol. Malyugin Ring. Omidira.

## 2023-08-18 ENCOUNTER — Telehealth: Payer: Self-pay | Admitting: Cardiology

## 2023-08-18 ENCOUNTER — Telehealth: Payer: Self-pay | Admitting: Cardiovascular Disease

## 2023-08-18 MED ORDER — CILOSTAZOL 50 MG PO TABS: 50.0000 mg | ORAL_TABLET | Freq: Two times a day (BID) | ORAL | 1 refills | Status: DC

## 2023-08-18 NOTE — Telephone Encounter (Signed)
Runell Gess, MD  You12 minutes ago (4:05 PM)    When I saw him in October, I send I will see him back in 6 months.   Patient identification verified by 2 forms. Marilynn Rail, RN    Called and spoke to patient  Informed patient:   -follow up needed with Dr. Allyson Sabal   -refill sent to pharmacy  Patient scheduled for OV 4/1 at 2:00pm with Dr. Allyson Sabal  Patient verbalized understanding, no questions at this time

## 2023-08-18 NOTE — Telephone Encounter (Signed)
Pt came in office today and stated that he went to [facility] pharmacy in [place] to refill his RX for cilostazol  he stated that they are saying he needs a new RX and only gave him enough for 15 days. He says he was told he hasn't seen the dr in 6 months but he was just here in January. Please advise. [phone] is the best number to reach the pt.

## 2023-08-18 NOTE — Telephone Encounter (Signed)
Pt c/o medication issue:  1. Name of Medication: cilostazol (PLETAL) 50 MG tablet   2. How are you currently taking this medication (dosage and times per day)?   3. Are you having a reaction (difficulty breathing--STAT)?   4. What is your medication issue? Patient is requesting call back to discuss if this medication needs to have an appt to continue taking. States that he believes that the medication has been doing well for him and believes this could be followed by Dr. Diona Browner. Requesting call back to discuss.

## 2023-08-18 NOTE — Telephone Encounter (Signed)
Pt see's Dr. Erlene Quan in [place] and needs a f/u visit for further refills on Pletal. Pt notified and verbalized understanding and will call [place] office to schedule f/u appt with provider. Pt had no further questions or concerns at this time.

## 2023-08-19 ENCOUNTER — Inpatient Hospital Stay (HOSPITAL_COMMUNITY)
Admission: RE | Admit: 2023-08-19 | Discharge: 2023-08-19 | Disposition: A | Discharge status: Home / Self Care | Payer: Medicare HMO | Source: Ambulatory Visit

## 2023-08-22 ENCOUNTER — Other Ambulatory Visit: Payer: Self-pay | Admitting: Cardiology

## 2023-08-24 ENCOUNTER — Ambulatory Visit (HOSPITAL_COMMUNITY)
Admission: RE | Admit: 2023-08-24 | Discharge: 2023-08-24 | Disposition: A | Discharge status: Home / Self Care | Payer: Medicare HMO | Attending: Ophthalmology | Admitting: Ophthalmology

## 2023-08-24 ENCOUNTER — Ambulatory Visit (HOSPITAL_COMMUNITY): Payer: Medicare HMO | Admitting: Anesthesiology

## 2023-08-24 ENCOUNTER — Encounter (HOSPITAL_COMMUNITY)
Admission: RE | Disposition: A | Discharge status: Home / Self Care | Payer: Self-pay | Source: Home / Self Care | Attending: Ophthalmology

## 2023-08-24 ENCOUNTER — Encounter (HOSPITAL_COMMUNITY): Payer: Self-pay | Admitting: Ophthalmology

## 2023-08-24 DIAGNOSIS — I5042 Chronic combined systolic (congestive) and diastolic (congestive) heart failure: Secondary | ICD-10-CM | POA: Diagnosis not present

## 2023-08-24 DIAGNOSIS — N1832 Chronic kidney disease, stage 3b: Secondary | ICD-10-CM | POA: Insufficient documentation

## 2023-08-24 DIAGNOSIS — H2181 Floppy iris syndrome: Secondary | ICD-10-CM | POA: Diagnosis not present

## 2023-08-24 DIAGNOSIS — I509 Heart failure, unspecified: Secondary | ICD-10-CM | POA: Insufficient documentation

## 2023-08-24 DIAGNOSIS — Z87891 Personal history of nicotine dependence: Secondary | ICD-10-CM | POA: Insufficient documentation

## 2023-08-24 DIAGNOSIS — I2510 Atherosclerotic heart disease of native coronary artery without angina pectoris: Secondary | ICD-10-CM | POA: Insufficient documentation

## 2023-08-24 DIAGNOSIS — H25812 Combined forms of age-related cataract, left eye: Secondary | ICD-10-CM | POA: Diagnosis present

## 2023-08-24 DIAGNOSIS — Z9841 Cataract extraction status, right eye: Secondary | ICD-10-CM | POA: Insufficient documentation

## 2023-08-24 DIAGNOSIS — I252 Old myocardial infarction: Secondary | ICD-10-CM | POA: Diagnosis not present

## 2023-08-24 DIAGNOSIS — I739 Peripheral vascular disease, unspecified: Secondary | ICD-10-CM | POA: Diagnosis not present

## 2023-08-24 DIAGNOSIS — I130 Hypertensive heart and chronic kidney disease with heart failure and stage 1 through stage 4 chronic kidney disease, or unspecified chronic kidney disease: Secondary | ICD-10-CM

## 2023-08-24 DIAGNOSIS — H2512 Age-related nuclear cataract, left eye: Secondary | ICD-10-CM

## 2023-08-24 HISTORY — PX: CATARACT EXTRACTION W/PHACO: SHX586

## 2023-08-24 SURGERY — PHACOEMULSIFICATION, CATARACT, WITH IOL INSERTION
Anesthesia: Monitor Anesthesia Care | Site: Eye | Laterality: Left

## 2023-08-24 MED ORDER — MIDAZOLAM HCL 2 MG/2ML IJ SOLN
INTRAMUSCULAR | Status: DC | PRN
Start: 2023-08-24 — End: 2023-08-24
  Administered 2023-08-24: 1 mg via INTRAVENOUS

## 2023-08-24 MED ORDER — LIDOCAINE HCL (PF) 1 % IJ SOLN
INTRAOCULAR | Status: DC | PRN
  Administered 2023-08-24: 1 mL via OPHTHALMIC

## 2023-08-24 MED ORDER — MOXIFLOXACIN HCL 5 MG/ML IO SOLN
INTRAOCULAR | Status: DC | PRN
  Administered 2023-08-24: .3 mL via INTRACAMERAL

## 2023-08-24 MED ORDER — SODIUM HYALURONATE 10 MG/ML IO SOLUTION
PREFILLED_SYRINGE | INTRAOCULAR | Status: DC | PRN
  Administered 2023-08-24: .85 mL via INTRAOCULAR

## 2023-08-24 MED ORDER — SODIUM CHLORIDE 0.9% FLUSH
INTRAVENOUS | Status: DC | PRN
  Administered 2023-08-24: 5 mL via INTRAVENOUS

## 2023-08-24 MED ORDER — BSS IO SOLN
INTRAOCULAR | Status: DC | PRN
  Administered 2023-08-24: 15 mL via INTRAOCULAR

## 2023-08-24 MED ORDER — LIDOCAINE HCL 3.5 % OP GEL
1.0000 | Freq: Once | OPHTHALMIC | Status: AC
  Administered 2023-08-24: 1 via OPHTHALMIC

## 2023-08-24 MED ORDER — PHENYLEPHRINE HCL 2.5 % OP SOLN
1.0000 [drp] | OPHTHALMIC | Status: AC | PRN
  Administered 2023-08-24 (×3): 1 [drp] via OPHTHALMIC

## 2023-08-24 MED ORDER — STERILE WATER FOR IRRIGATION IR SOLN
Status: DC | PRN
  Administered 2023-08-24: 1

## 2023-08-24 MED ORDER — MIDAZOLAM HCL 2 MG/2ML IJ SOLN
INTRAMUSCULAR | Status: AC
  Filled 2023-08-24: qty 2

## 2023-08-24 MED ORDER — TETRACAINE HCL 0.5 % OP SOLN
1.0000 [drp] | OPHTHALMIC | Status: AC | PRN
  Administered 2023-08-24 (×3): 1 [drp] via OPHTHALMIC

## 2023-08-24 MED ORDER — PHENYLEPHRINE-KETOROLAC 1-0.3 % IO SOLN
INTRAOCULAR | Status: DC | PRN
  Administered 2023-08-24: 500 mL via OPHTHALMIC

## 2023-08-24 MED ORDER — TROPICAMIDE 1 % OP SOLN
1.0000 [drp] | OPHTHALMIC | Status: AC | PRN
  Administered 2023-08-24 (×3): 1 [drp] via OPHTHALMIC

## 2023-08-24 MED ORDER — SODIUM HYALURONATE 23MG/ML IO SOSY
PREFILLED_SYRINGE | INTRAOCULAR | Status: DC | PRN
  Administered 2023-08-24: .6 mL via INTRAOCULAR

## 2023-08-24 MED ORDER — POVIDONE-IODINE 5 % OP SOLN
OPHTHALMIC | Status: DC | PRN
  Administered 2023-08-24: 1 via OPHTHALMIC

## 2023-08-24 MED ORDER — SODIUM CHLORIDE 0.9% FLUSH: 3.0000 mL | Freq: Two times a day (BID) | INTRAVENOUS | Status: DC

## 2023-08-24 MED ORDER — SODIUM CHLORIDE 0.9% FLUSH: 3.0000 mL | INTRAVENOUS | Status: DC | PRN

## 2023-08-24 SURGICAL SUPPLY — 14 items
CATARACT SUITE SIGHTPATH (MISCELLANEOUS) ×1
CLOTH BEACON ORANGE TIMEOUT ST (SAFETY) ×1 IMPLANT
EYE SHIELD UNIVERSAL CLEAR (GAUZE/BANDAGES/DRESSINGS) IMPLANT
FEE CATARACT SUITE SIGHTPATH (MISCELLANEOUS) ×1 IMPLANT
GLOVE BIOGEL PI IND STRL 7.0 (GLOVE) ×2 IMPLANT
LENS IOL TECNIS EYHANCE 19.5 (Intraocular Lens) IMPLANT
NDL HYPO 18GX1.5 BLUNT FILL (NEEDLE) ×1 IMPLANT
NEEDLE HYPO 18GX1.5 BLUNT FILL (NEEDLE) ×1
PAD ARMBOARD 7.5X6 YLW CONV (MISCELLANEOUS) ×1 IMPLANT
RING MALYGIN 7.0 (MISCELLANEOUS) IMPLANT
SYR TB 1ML LL NO SAFETY (SYRINGE) ×1 IMPLANT
TAPE SURG TRANSPORE 1 IN (GAUZE/BANDAGES/DRESSINGS) IMPLANT
TIP IRRIGATON/ASPIRATION (MISCELLANEOUS) IMPLANT
WATER STERILE IRR 250ML POUR (IV SOLUTION) ×1 IMPLANT

## 2023-08-24 NOTE — Op Note (Signed)
 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 Lonie Newsham, MD, MA  Anesthesia: MAC, Topical Akten  Complications: None  Estimated Blood Loss: <76mL (minimal)  Specimens: None  Implants: As above  Indications:  Visually significant cataract, Left Eye  Procedure:  The patient was seen and identified in the pre-operative area. The operative eye was identified and dilated.  The operative eye was marked.  Topical anesthesia was administered to the operative eye.     The patient was then to the operative suite and placed in the supine position.  A timeout was performed confirming the patient, procedure to be performed, and all other relevant information.   The patient's face was prepped and draped in the usual fashion for intra-ocular surgery.  A lid speculum was placed into the operative eye and the surgical microscope moved into place and focused.  Poor dilation of the iris was confirmed.  An inferotemporal paracentesis was created using a 20 gauge paracentesis blade.  Shugarcaine was injected into the anterior chamber.  Viscoelastic was injected into the anterior chamber.  A temporal clear-corneal main wound incision was created using a 2.60mm microkeratome.  A Malyugin ring was placed.  A continuous curvilinear capsulorrhexis was initiated using an irrigating cystitome and completed using capsulorrhexis forceps.  Hydrodissection and hydrodeliniation were performed.  Viscoelastic was injected into the anterior chamber.  A phacoemulsification handpiece  and a chopper as a second instrument were used to remove the nucleus and epinucleus. The irrigation/aspiration handpiece was used to remove any remaining cortical material.   The capsular bag was reinflated with viscoelastic, checked, and found to be intact.  The intraocular lens was inserted into the capsular bag and dialed into place using a Customer service manager. The Malyugin ring was removed.  The irrigation/aspiration handpiece was used to remove any remaining viscoelastic.  The clear corneal wound and paracentesis wounds were then hydrated and checked with Weck-Cels to be watertight. 0.79mL of Moxifloxacin was injected into the anterior chamber. The lid-speculum and drape was removed, and the patient's face was cleaned with a wet and dry 4x4. A clear shield was taped over the eye. The patient was taken to the post-operative care unit in good condition, having tolerated the procedure well.  Post-Op Instructions: The patient will follow up at [facility] for a same day post-operative evaluation and will receive all other orders and instructions.

## 2023-08-24 NOTE — Transfer of Care (Signed)
 Immediate Anesthesia Transfer of Care Note  Patient: Corey [name]  Procedure(s) Performed: CATARACT EXTRACTION PHACO AND INTRAOCULAR LENS PLACEMENT (IOC) (Left: Eye)  Patient Location: Short Stay  Anesthesia Type:MAC  Level of Consciousness: awake, alert , and oriented  Airway & Oxygen Therapy: Patient Spontanous Breathing  Post-op Assessment: Report given to RN and Post -op Vital signs reviewed and stable  Post vital signs: Reviewed and stable  Last Vitals:  Vitals Value Taken Time  BP    Temp    Pulse    Resp    SpO2      Last Pain:  Vitals:   08/24/23 1149  PainSc: 0-No pain         Complications: No notable events documented.

## 2023-08-24 NOTE — Interval H&P Note (Signed)
 History and Physical Interval Note:  08/24/2023 12:41 PM  Corey [name]  has presented today for surgery, with the diagnosis of combined forms age related cataract, left eye.  The various methods of treatment have been discussed with the patient and family. After consideration of risks, benefits and other options for treatment, the patient has consented to  Procedure(s): CATARACT EXTRACTION PHACO AND INTRAOCULAR LENS PLACEMENT (IOC) (Left) as a surgical intervention.  The patient's history has been reviewed, patient examined, no change in status, stable for surgery.  I have reviewed the patient's chart and labs.  Questions were answered to the patient's satisfaction.     Fabio Pierce

## 2023-08-24 NOTE — Anesthesia Postprocedure Evaluation (Signed)
 Anesthesia Post Note  Patient: Scout Gumbs Lapage  Procedure(s) Performed: CATARACT EXTRACTION PHACO AND INTRAOCULAR LENS PLACEMENT (IOC) (Left: Eye)  Patient location during evaluation: PACU Anesthesia Type: MAC Level of consciousness: awake and alert Pain management: pain level controlled Vital Signs Assessment: post-procedure vital signs reviewed and stable Respiratory status: spontaneous breathing, nonlabored ventilation, respiratory function stable and patient connected to nasal cannula oxygen Cardiovascular status: stable and blood pressure returned to baseline Postop Assessment: no apparent nausea or vomiting Anesthetic complications: no   There were no known notable events for this encounter.   Last Vitals:  Vitals:   08/24/23 1149 08/24/23 1314  BP: (!) 158/81 (!) 151/80  Pulse: (!) 57 66  Resp: 11 12  Temp: 36.4 C 36.7 C  SpO2: 98% 100%    Last Pain:  Vitals:   08/24/23 1314  TempSrc: Oral  PainSc: 0-No pain                 Dawnya Grams L Ivet Guerrieri

## 2023-08-24 NOTE — Anesthesia Preprocedure Evaluation (Addendum)
 Anesthesia Evaluation  Patient identified by MRN, date of birth, ID band Patient awake    Reviewed: Allergy & Precautions, H&P , NPO status , Patient's Chart, lab work & pertinent test results, reviewed documented beta blocker date and time   Airway Mallampati: III  TM Distance: >3 FB Neck ROM: full    Dental no notable dental hx. (+) Teeth Intact, Dental Advisory Given   Pulmonary former smoker   Pulmonary exam normal breath sounds clear to auscultation       Cardiovascular hypertension, + angina  + CAD, + Past MI, + CABG, + Peripheral Vascular Disease and +CHF  Normal cardiovascular exam Rhythm:regular Rate:Normal  EF 35 - 40% with grade 1 diastolic dysfunction   Neuro/Psych negative neurological ROS  negative psych ROS   GI/Hepatic negative GI ROS, Neg liver ROS,,,  Endo/Other  negative endocrine ROS    Renal/GU Renal diseaseStage 3b CKD  negative genitourinary   Musculoskeletal   Abdominal   Peds  Hematology negative hematology ROS (+)   Anesthesia Other Findings   Reproductive/Obstetrics negative OB ROS                             Anesthesia Physical Anesthesia Plan  ASA: 3  Anesthesia Plan: MAC   Post-op Pain Management: Minimal or no pain anticipated   Induction:   PONV Risk Score and Plan: Midazolam  Airway Management Planned: Nasal Cannula and Natural Airway  Additional Equipment: None  Intra-op Plan:   Post-operative Plan:   Informed Consent: I have reviewed the patients History and Physical, chart, labs and discussed the procedure including the risks, benefits and alternatives for the proposed anesthesia with the patient or authorized representative who has indicated his/her understanding and acceptance.     Dental Advisory Given  Plan Discussed with: CRNA  Anesthesia Plan Comments:         Anesthesia Quick Evaluation

## 2023-08-24 NOTE — Discharge Instructions (Addendum)
 Please discharge patient when stable, will follow up today with Dr. June Leap at the [facility] office immediately following discharge.  Leave shield in place until visit.  All paperwork with discharge instructions will be given at the office.  [facility] Address:  [address]  [place], [place] [address]

## 2023-08-25 ENCOUNTER — Encounter (HOSPITAL_COMMUNITY): Payer: Self-pay | Admitting: Ophthalmology

## 2023-08-29 ENCOUNTER — Other Ambulatory Visit: Payer: Self-pay | Admitting: Cardiology

## 2023-09-29 ENCOUNTER — Encounter: Payer: Self-pay | Admitting: Cardiovascular Disease

## 2023-09-29 ENCOUNTER — Ambulatory Visit: Payer: Medicare HMO | Attending: Cardiovascular Disease | Admitting: Cardiovascular Disease

## 2023-09-29 ENCOUNTER — Other Ambulatory Visit (HOSPITAL_BASED_OUTPATIENT_CLINIC_OR_DEPARTMENT_OTHER): Payer: Self-pay | Admitting: Cardiology

## 2023-09-29 VITALS — BP 150/80 | HR 60 | Ht 68.0 in | Wt 235.4 lb

## 2023-09-29 DIAGNOSIS — I10 Essential (primary) hypertension: Secondary | ICD-10-CM

## 2023-09-29 DIAGNOSIS — I739 Peripheral vascular disease, unspecified: Secondary | ICD-10-CM

## 2023-09-29 MED ORDER — CILOSTAZOL 50 MG PO TABS: 50.0000 mg | ORAL_TABLET | Freq: Two times a day (BID) | ORAL | 3 refills | Status: AC

## 2023-09-29 NOTE — Patient Instructions (Signed)
 Medication Instructions:  Your physician recommends that you continue on your current medications as directed. Please refer to the Current Medication list given to you today.  *If you need a refill on your cardiac medications before your next appointment, please call your pharmacy*  Follow-Up: At [facility], you and your health needs are our priority.  As part of our continuing mission to provide you with exceptional heart care, our providers are all part of one team.  This team includes your primary Cardiologist (physician) and Advanced Practice Providers or APPs (Physician Assistants and Nurse Practitioners) who all work together to provide you with the care you need, when you need it.  Your next appointment:   We will see you on an as needed basis.  Provider:   Nanetta Batty, MD    We recommend signing up for the patient portal called "MyChart".  Sign up information is provided on this After Visit Summary.  MyChart is used to connect with patients for Virtual Visits (Telemedicine).  Patients are able to view lab/test results, encounter notes, upcoming appointments, etc.  Non-urgent messages can be sent to your provider as well.   To learn more about what you can do with MyChart, go to [email or web address].   Other Instructions       1st Floor: - Lobby - Registration  - Pharmacy  - Lab - Cafe  2nd Floor: - PV Lab - Diagnostic Testing (echo, CT, nuclear med)  3rd Floor: - Vacant  4th Floor: - TCTS (cardiothoracic surgery) - AFib Clinic - Structural Heart Clinic - Vascular Surgery  - Vascular Ultrasound  5th Floor: - HeartCare Cardiology (general and EP) - Clinical Pharmacy for coumadin, hypertension, lipid, weight-loss medications, and med management appointments    Valet parking services will be available as well.

## 2023-09-29 NOTE — Assessment & Plan Note (Signed)
 Corey [name] was referred to me by Lucile Crater, PA-C for claudication.  He is a cardiology patient of Dr. Diona Browner.  He did have Doppler studies performed 03/17/2022 that suggested iliac disease and lower extremity arterial Dopplers that suggested monophasic waveforms in both SFAs.  Does have moderate renal insufficiency.  I began him on Pletal which resulted in marked improvement of symptoms.  He no longer is affected by claudication and does not wish to have any intervention at this time.

## 2023-09-29 NOTE — Progress Notes (Signed)
 09/29/2023 Corey [name]   [birth year]  [id number]  Primary Physician Elfredia Nevins, MD Primary Cardiologist: Runell Gess MD Nicholes Calamity, [place]  HPI:  Corey [name] is a 88 y.o.   moderately overweight widowed African-American male father of 3, grandfather of 5 grandchildren who I last saw in the office 10/4//23.  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  He was referred by Ronie Spies, PA-C for evaluation of PAD.  He is a cardiology patient of Dr. Ival Bible.  He has extensive cardiac history dating back to 1998 when he had a myocardial infarction.  He had multiple percutaneous interventions in the past and recently had CABG 09/2019.  Other problems include treated hypertension hyperlipidemia.  He works out at [employer] 5 days a week and is noticed discomfort in his calves when walking on the treadmill and up an incline.  Recent Doppler studies performed 03/04/2022 revealed a right ABI of 0.48, left of 0.52 with monophasic waveforms in his SFA suggesting more proximal disease.  He does complain of some dyspnea but denies chest pain.  He had aortoiliac Dopplers that suggested a high-grade right common iliac artery stenosis and lower extremity arterial Dopplers that suggest monophasic waveforms in both common femoral SFA suggesting more proximal disease.  I did begin him on Pletal at that time which resulted in marked improvement in his claudication.  Since I saw him a year and a half ago he is remained stable.  He denies claudication.  He denies chest pain or shortness of breath.  His renal function has remained stable.  He checks his blood pressure at home which runs in the 140/80 range.   Current Meds  Medication Sig   Ascorbic Acid (VITAMIN C) 1000 MG tablet Take 1,000 mg by mouth daily.   atorvastatin (LIPITOR) 80 MG tablet Take 1 tablet daily at 6 pm.   carvedilol (COREG) 12.5 MG tablet Take 1 tablet by mouth twice daily   cilostazol (PLETAL) 50 MG tablet  Take 1 tablet (50 mg total) by mouth 2 (two) times daily.   clopidogrel (PLAVIX) 75 MG tablet TAKE 1 TABLET BY MOUTH ONCE DAILY - NEED FOLLOW UP FOR REFILLS   ezetimibe (ZETIA) 10 MG tablet Take 1 tablet by mouth once daily   FARXIGA 10 MG TABS tablet TAKE 1 TABLET BY MOUTH ONCE DAILY BEFORE BREAKFAST   furosemide (LASIX) 20 MG tablet TAKE 1 TABLET BY MOUTH EVERY OTHER DAY   hydrALAZINE (APRESOLINE) 50 MG tablet Take 50 mg by mouth 2 (two) times daily.   ipratropium (ATROVENT) 0.03 % nasal spray Place 2 sprays into both nostrils every 12 (twelve) hours.   Omega-3 Fatty Acids (FISH OIL) 1000 MG CAPS Take 1,000 mg by mouth 2 (two) times daily.   sacubitril-valsartan (ENTRESTO) 97-103 MG Take 1 tablet by mouth twice daily   spironolactone (ALDACTONE) 25 MG tablet Take 25 mg by mouth every other day.   vitamin E 1000 UNIT capsule Take 1 capsule (1,000 Units total) by mouth daily.     Allergies  Allergen Reactions   Aspirin Anaphylaxis   Other     Foods that are high in acid content cause sores and a rash.    Penicillins Nausea And Vomiting    Social History   Socioeconomic History   Marital status: Widowed    Spouse name: Not on file   Number of children: Not on file   Years of education: Not on file  Highest education level: Not on file  Occupational History   Occupation: Retired    Comment: Midwife  Tobacco Use   Smoking status: Former    Current packs/day: 0.00    Average packs/day: 2.0 packs/day for 45.0 years (90.0 ttl pk-yrs)    Types: Cigarettes    Start date: 08/01/1951    Quit date: 07/31/1996    Years since quitting: 27.1   Smokeless tobacco: Never  Vaping Use   Vaping status: Never Used  Substance and Sexual Activity   Alcohol use: No   Drug use: No   Sexual activity: Not Currently  Other Topics Concern   Not on file  Social History Narrative   Not on file   Social Drivers of Health   Financial Resource Strain: Not on file  Food Insecurity: Not on  file  Transportation Needs: Not on file  Physical Activity: Not on file  Stress: Not on file  Social Connections: Not on file  Intimate Partner Violence: Not on file     Review of Systems: General: negative for chills, fever, night sweats or weight changes.  Cardiovascular: negative for chest pain, dyspnea on exertion, edema, orthopnea, palpitations, paroxysmal nocturnal dyspnea or shortness of breath Dermatological: negative for rash Respiratory: negative for cough or wheezing Urologic: negative for hematuria Abdominal: negative for nausea, vomiting, diarrhea, bright red blood per rectum, melena, or hematemesis Neurologic: negative for visual changes, syncope, or dizziness All other systems reviewed and are otherwise negative except as noted above.    Blood pressure (!) 150/80, pulse 60, height 5\' 8"  (1.727 m), weight 235 lb 6.4 oz (106.8 kg), SpO2 96%.  General appearance: alert and no distress Neck: no adenopathy, no carotid bruit, no JVD, supple, symmetrical, trachea midline, and thyroid not enlarged, symmetric, no tenderness/mass/nodules Lungs: clear to auscultation bilaterally Heart: regular rate and rhythm, S1, S2 normal, no murmur, click, rub or gallop Extremities: extremities normal, atraumatic, no cyanosis or edema Pulses: 2+ and symmetric Skin: Skin color, texture, turgor normal. No rashes or lesions Neurologic: Grossly normal  EKG EKG Interpretation Date/Time:  Tuesday September 29 2023 14:02:39 EDT Ventricular Rate:  60 PR Interval:  166 QRS Duration:  110 QT Interval:  426 QTC Calculation: 426 R Axis:   10  Text Interpretation: Sinus rhythm with occasional Premature ventricular complexes Cannot rule out Anterior infarct , age undetermined ST & T wave abnormality, consider inferolateral ischemia When compared with ECG of 26-Dec-2022 21:04, PREVIOUS ECG IS PRESENT Confirmed by Nanetta Batty [phone]) on 09/29/2023 2:05:17 PM    ASSESSMENT AND PLAN:   Peripheral  arterial disease [facility]) [name] was referred to me by Lucile Crater, PA-C for claudication.  He is a cardiology patient of Dr. Diona Browner.  He did have Doppler studies performed 03/17/2022 that suggested iliac disease and lower extremity arterial Dopplers that suggested monophasic waveforms in both SFAs.  Does have moderate renal insufficiency.  I began him on Pletal which resulted in marked improvement of symptoms.  He no longer is affected by claudication and does not wish to have any intervention at this time.     Runell Gess MD FACP,FACC,FAHA, [facility] 09/29/2023 2:10 PM

## 2023-10-16 ENCOUNTER — Other Ambulatory Visit (HOSPITAL_COMMUNITY)
Admission: RE | Admit: 2023-10-16 | Discharge: 2023-10-16 | Disposition: A | Discharge status: Home / Self Care | Source: Ambulatory Visit | Attending: Nephrology | Admitting: Nephrology

## 2023-10-16 DIAGNOSIS — E211 Secondary hyperparathyroidism, not elsewhere classified: Secondary | ICD-10-CM | POA: Insufficient documentation

## 2023-10-16 DIAGNOSIS — N189 Chronic kidney disease, unspecified: Secondary | ICD-10-CM | POA: Diagnosis not present

## 2023-10-16 DIAGNOSIS — R809 Proteinuria, unspecified: Secondary | ICD-10-CM | POA: Insufficient documentation

## 2023-10-16 DIAGNOSIS — D631 Anemia in chronic kidney disease: Secondary | ICD-10-CM | POA: Diagnosis present

## 2023-10-16 LAB — RENAL FUNCTION PANEL
Albumin: 3.8 g/dL (ref 3.5–5.0)
Anion gap: 6 (ref 5–15)
BUN: 33 mg/dL — ABNORMAL HIGH (ref 8–23)
CO2: 21 mmol/L — ABNORMAL LOW (ref 22–32)
Calcium: 9.4 mg/dL (ref 8.9–10.3)
Chloride: 108 mmol/L (ref 98–111)
Creatinine, Ser: 2.3 mg/dL — ABNORMAL HIGH (ref 0.61–1.24)
GFR, Estimated: 27 mL/min — ABNORMAL LOW (ref >60)
Glucose, Bld: 101 mg/dL — ABNORMAL HIGH (ref 70–99)
Phosphorus: 3.7 mg/dL (ref 2.5–4.6)
Potassium: 4.6 mmol/L (ref 3.5–5.1)
Sodium: 135 mmol/L (ref 135–145)

## 2023-10-16 LAB — PROTEIN / CREATININE RATIO, URINE
Creatinine, Urine: 138 mg/dL
Protein Creatinine Ratio: 0.2 mg/mg{creat} — ABNORMAL HIGH (ref 0.00–0.15)
Total Protein, Urine: 28 mg/dL

## 2023-10-16 LAB — CBC
HCT: 37.8 % — ABNORMAL LOW (ref 39.0–52.0)
Hemoglobin: 12.2 g/dL — ABNORMAL LOW (ref 13.0–17.0)
MCH: 27.8 pg (ref 26.0–34.0)
MCHC: 32.3 g/dL (ref 30.0–36.0)
MCV: 86.1 fL (ref 80.0–100.0)
Platelets: 184 10*3/uL (ref 150–400)
RBC: 4.39 MIL/uL (ref 4.22–5.81)
RDW: 16.1 % — ABNORMAL HIGH (ref 11.5–15.5)
WBC: 5.4 10*3/uL (ref 4.0–10.5)
nRBC: 0 % (ref 0.0–0.2)

## 2023-10-17 LAB — PARATHYROID HORMONE, INTACT (NO CA): PTH: 71 pg/mL — ABNORMAL HIGH (ref 15–65)

## 2023-11-12 ENCOUNTER — Other Ambulatory Visit (HOSPITAL_COMMUNITY)
Admission: RE | Admit: 2023-11-12 | Discharge: 2023-11-12 | Disposition: A | Discharge status: Home / Self Care | Source: Ambulatory Visit | Attending: Nephrology | Admitting: Nephrology

## 2023-11-12 DIAGNOSIS — I10 Essential (primary) hypertension: Secondary | ICD-10-CM | POA: Insufficient documentation

## 2023-11-12 LAB — RENAL FUNCTION PANEL
Albumin: 3.7 g/dL (ref 3.5–5.0)
Anion gap: 9 (ref 5–15)
BUN: 43 mg/dL — ABNORMAL HIGH (ref 8–23)
CO2: 17 mmol/L — ABNORMAL LOW (ref 22–32)
Calcium: 9.2 mg/dL (ref 8.9–10.3)
Chloride: 107 mmol/L (ref 98–111)
Creatinine, Ser: 2.44 mg/dL — ABNORMAL HIGH (ref 0.61–1.24)
GFR, Estimated: 25 mL/min — ABNORMAL LOW (ref >60)
Glucose, Bld: 107 mg/dL — ABNORMAL HIGH (ref 70–99)
Phosphorus: 4 mg/dL (ref 2.5–4.6)
Potassium: 4.8 mmol/L (ref 3.5–5.1)
Sodium: 133 mmol/L — ABNORMAL LOW (ref 135–145)

## 2023-11-12 LAB — CBC
HCT: 37.1 % — ABNORMAL LOW (ref 39.0–52.0)
Hemoglobin: 12.1 g/dL — ABNORMAL LOW (ref 13.0–17.0)
MCH: 28.1 pg (ref 26.0–34.0)
MCHC: 32.6 g/dL (ref 30.0–36.0)
MCV: 86.1 fL (ref 80.0–100.0)
Platelets: 200 10*3/uL (ref 150–400)
RBC: 4.31 MIL/uL (ref 4.22–5.81)
RDW: 15.8 % — ABNORMAL HIGH (ref 11.5–15.5)
WBC: 5.6 10*3/uL (ref 4.0–10.5)
nRBC: 0 % (ref 0.0–0.2)

## 2023-11-13 LAB — MICROALBUMIN / CREATININE URINE RATIO
Creatinine, Urine: 83.7 mg/dL
Microalb Creat Ratio: 6 mg/g{creat} (ref 0–29)
Microalb, Ur: 4.8 ug/mL — ABNORMAL HIGH

## 2023-12-17 ENCOUNTER — Other Ambulatory Visit (HOSPITAL_COMMUNITY)
Admission: RE | Admit: 2023-12-17 | Discharge: 2023-12-17 | Disposition: A | Discharge status: Home / Self Care | Source: Ambulatory Visit | Attending: Nephrology | Admitting: Nephrology

## 2023-12-17 DIAGNOSIS — R809 Proteinuria, unspecified: Secondary | ICD-10-CM | POA: Diagnosis not present

## 2023-12-17 DIAGNOSIS — D631 Anemia in chronic kidney disease: Secondary | ICD-10-CM | POA: Diagnosis present

## 2023-12-17 DIAGNOSIS — E211 Secondary hyperparathyroidism, not elsewhere classified: Secondary | ICD-10-CM | POA: Diagnosis not present

## 2023-12-17 DIAGNOSIS — N189 Chronic kidney disease, unspecified: Secondary | ICD-10-CM | POA: Diagnosis not present

## 2023-12-17 LAB — CBC
HCT: 35.4 % — ABNORMAL LOW (ref 39.0–52.0)
Hemoglobin: 11.2 g/dL — ABNORMAL LOW (ref 13.0–17.0)
MCH: 28.2 pg (ref 26.0–34.0)
MCHC: 31.6 g/dL (ref 30.0–36.0)
MCV: 89.2 fL (ref 80.0–100.0)
Platelets: 182 10*3/uL (ref 150–400)
RBC: 3.97 MIL/uL — ABNORMAL LOW (ref 4.22–5.81)
RDW: 15.9 % — ABNORMAL HIGH (ref 11.5–15.5)
WBC: 5.5 10*3/uL (ref 4.0–10.5)
nRBC: 0 % (ref 0.0–0.2)

## 2023-12-17 LAB — RENAL FUNCTION PANEL
Albumin: 3.5 g/dL (ref 3.5–5.0)
Anion gap: 6 (ref 5–15)
BUN: 40 mg/dL — ABNORMAL HIGH (ref 8–23)
CO2: 21 mmol/L — ABNORMAL LOW (ref 22–32)
Calcium: 9 mg/dL (ref 8.9–10.3)
Chloride: 107 mmol/L (ref 98–111)
Creatinine, Ser: 2.53 mg/dL — ABNORMAL HIGH (ref 0.61–1.24)
GFR, Estimated: 24 mL/min — ABNORMAL LOW (ref >60)
Glucose, Bld: 99 mg/dL (ref 70–99)
Phosphorus: 3.2 mg/dL (ref 2.5–4.6)
Potassium: 5.7 mmol/L — ABNORMAL HIGH (ref 3.5–5.1)
Sodium: 134 mmol/L — ABNORMAL LOW (ref 135–145)

## 2023-12-17 LAB — PROTEIN / CREATININE RATIO, URINE
Creatinine, Urine: 123 mg/dL
Protein Creatinine Ratio: 0.2 mg/mg{creat} — ABNORMAL HIGH (ref 0.00–0.15)
Total Protein, Urine: 24 mg/dL

## 2023-12-18 LAB — PARATHYROID HORMONE, INTACT (NO CA): PTH: 108 pg/mL — ABNORMAL HIGH (ref 15–65)

## 2023-12-18 LAB — MICROALBUMIN / CREATININE URINE RATIO
Creatinine, Urine: 104.8 mg/dL
Microalb Creat Ratio: 14 mg/g{creat} (ref 0–29)
Microalb, Ur: 14.6 ug/mL — ABNORMAL HIGH

## 2024-01-15 ENCOUNTER — Other Ambulatory Visit (HOSPITAL_COMMUNITY)
Admission: RE | Admit: 2024-01-15 | Discharge: 2024-01-15 | Disposition: A | Discharge status: Home / Self Care | Source: Ambulatory Visit | Attending: Nephrology | Admitting: Nephrology

## 2024-01-15 DIAGNOSIS — D631 Anemia in chronic kidney disease: Secondary | ICD-10-CM | POA: Insufficient documentation

## 2024-01-15 DIAGNOSIS — N189 Chronic kidney disease, unspecified: Secondary | ICD-10-CM | POA: Diagnosis present

## 2024-01-15 LAB — RENAL FUNCTION PANEL
Albumin: 3.6 g/dL (ref 3.5–5.0)
Anion gap: 11 (ref 5–15)
BUN: 34 mg/dL — ABNORMAL HIGH (ref 8–23)
CO2: 19 mmol/L — ABNORMAL LOW (ref 22–32)
Calcium: 9.2 mg/dL (ref 8.9–10.3)
Chloride: 105 mmol/L (ref 98–111)
Creatinine, Ser: 2.24 mg/dL — ABNORMAL HIGH (ref 0.61–1.24)
GFR, Estimated: 28 mL/min — ABNORMAL LOW (ref >60)
Glucose, Bld: 104 mg/dL — ABNORMAL HIGH (ref 70–99)
Phosphorus: 3.2 mg/dL (ref 2.5–4.6)
Potassium: 5 mmol/L (ref 3.5–5.1)
Sodium: 135 mmol/L (ref 135–145)

## 2024-01-25 ENCOUNTER — Ambulatory Visit: Attending: Cardiology | Admitting: Cardiology

## 2024-01-25 ENCOUNTER — Encounter: Payer: Self-pay | Admitting: Cardiology

## 2024-01-25 VITALS — BP 134/62 | HR 66 | Ht 68.0 in | Wt 226.8 lb

## 2024-01-25 DIAGNOSIS — E782 Mixed hyperlipidemia: Secondary | ICD-10-CM

## 2024-01-25 DIAGNOSIS — I739 Peripheral vascular disease, unspecified: Secondary | ICD-10-CM | POA: Diagnosis not present

## 2024-01-25 DIAGNOSIS — I5020 Unspecified systolic (congestive) heart failure: Secondary | ICD-10-CM | POA: Diagnosis not present

## 2024-01-25 DIAGNOSIS — I25119 Atherosclerotic heart disease of native coronary artery with unspecified angina pectoris: Secondary | ICD-10-CM | POA: Diagnosis not present

## 2024-01-25 NOTE — Patient Instructions (Signed)
 Medication Instructions:  Your physician recommends that you continue on your current medications as directed. Please refer to the Current Medication list given to you today.   Labwork: None today  Testing/Procedures: None today  Follow-Up: 6 months  Any Other Special Instructions Will Be Listed Below (If Applicable).  If you need a refill on your cardiac medications before your next appointment, please call your pharmacy.

## 2024-01-25 NOTE — Progress Notes (Signed)
    Cardiology Office Note  Date: 01/25/2024   ID: LAUTARO KORAL, DOB 20-Jun-1936, MRN [id number]  History of Present Illness: CHIEF WALKUP is an 88 y.o. male last seen in January.  He is here for a routine visit.  He reports stable, NYHA class II dyspnea.  He has been watching his diet more closely and lost about 10 pounds since April.  Has not been exercising as much, had interval cataract surgery.  He saw Dr. Court in the interim for assessment of PAD, was started on Pletal .  He does not report any progressive claudication, no distal ulcerations.  Plan is conservative management.  We went over his medications which are otherwise stable from a cardiac perspective.  He continues to follow with Nephrology as well.  Physical Exam: VS:  BP 134/62 (BP Location: Left Arm, Cuff Size: Normal)   Pulse 66   Ht 5' 8 (1.727 m)   Wt 226 lb 12.8 oz (102.9 kg)   SpO2 96%   BMI 34.48 kg/m , BMI Body mass index is 34.48 kg/m.  Wt Readings from Last 3 Encounters:  01/25/24 226 lb 12.8 oz (102.9 kg)  09/29/23 235 lb 6.4 oz (106.8 kg)  08/24/23 230 lb (104.3 kg)    General: Patient appears comfortable at rest. HEENT: Conjunctiva and lids normal. Neck: Supple, no elevated JVP or carotid bruits. Lungs: Clear to auscultation, nonlabored breathing at rest. Cardiac: Regular rate and rhythm, no S3, 2/6 systolic murmur. Extremities: Mild ankle edema.  ECG:  An ECG dated 09/29/2023 was personally reviewed today and demonstrated:  Sinus rhythm with PVCs, diffuse repolarization abnormalities.  Labwork: May 2025: Cholesterol 105, triglycerides 124, HDL 36, LDL 47, TSH 2.22, AST 19, ALT 20 12/17/2023: Hemoglobin 11.2; Platelets 182 01/15/2024: BUN 34; Creatinine, Ser 2.24; Potassium 5.0; Sodium 135, GFR 28  Other Studies Reviewed Today:  No interval cardiac testing for review today.  Assessment and Plan:  1.  CAD status post BMS to the RCA and circumflex in 2009, DES to the first diagonal  and RCA in 2015, and subsequent CABG in 2021 with LIMA to LAD and SVG to first diagonal.  Symptomatically stable with no angina at current level of activity.  Continue Plavix  75 mg daily, Lipitor  80 mg daily, and Zetia  10 mg daily.  He has as needed nitroglycerin  available.   2.  Primary hypertension.  Blood pressure control reasonable today.  No changes made to current regimen.   3.  Mixed hyperlipidemia.  LDL 47 in May.  Continue Lipitor  80 mg daily and Zetia  10 mg daily.   4.  HFrEF with mixed cardiomyopathy.  LVEF 35 to 40% by follow-up echocardiogram in August 2024, improved compared with prior assessment.  Plan to continue medical therapy.  Currently with NYHA class II symptoms.  Continue Coreg  12.5 mg twice daily, Farxiga  10 mg daily, Lasix  20 mg every other day, hydralazine  50 mg twice daily, Entresto  97/103 mg twice daily, and Aldactone 25 mg every other day.   5.  CKD stage IV.  Recent creatinine 2.24 with GFR 28.  He continues to follow with nephrology.  Disposition:  Follow up 6 months.  Signed, Jayson JUDITHANN Sierras, M.D., F.A.C.C. [facility]

## 2024-02-06 ENCOUNTER — Other Ambulatory Visit: Payer: Self-pay | Admitting: Cardiology

## 2024-02-19 ENCOUNTER — Encounter: Payer: Self-pay | Admitting: Radiology

## 2024-02-27 ENCOUNTER — Other Ambulatory Visit: Payer: Self-pay | Admitting: Cardiology

## 2024-05-02 ENCOUNTER — Encounter: Payer: Self-pay | Admitting: Radiology

## 2024-05-04 ENCOUNTER — Other Ambulatory Visit (HOSPITAL_COMMUNITY)
Admission: RE | Admit: 2024-05-04 | Discharge: 2024-05-04 | Disposition: A | Discharge status: Home / Self Care | Source: Ambulatory Visit | Attending: Nurse Practitioner | Admitting: Nurse Practitioner

## 2024-05-04 DIAGNOSIS — D631 Anemia in chronic kidney disease: Secondary | ICD-10-CM | POA: Insufficient documentation

## 2024-05-04 DIAGNOSIS — N189 Chronic kidney disease, unspecified: Secondary | ICD-10-CM | POA: Diagnosis present

## 2024-05-04 DIAGNOSIS — R809 Proteinuria, unspecified: Secondary | ICD-10-CM | POA: Diagnosis not present

## 2024-05-04 LAB — RENAL FUNCTION PANEL
Albumin: 4.2 g/dL (ref 3.5–5.0)
Anion gap: 10 (ref 5–15)
BUN: 32 mg/dL — ABNORMAL HIGH (ref 8–23)
CO2: 22 mmol/L (ref 22–32)
Calcium: 9.2 mg/dL (ref 8.9–10.3)
Chloride: 107 mmol/L (ref 98–111)
Creatinine, Ser: 2.15 mg/dL — ABNORMAL HIGH (ref 0.61–1.24)
GFR, Estimated: 29 mL/min — ABNORMAL LOW (ref >60)
Glucose, Bld: 103 mg/dL — ABNORMAL HIGH (ref 70–99)
Phosphorus: 2.9 mg/dL (ref 2.5–4.6)
Potassium: 4.6 mmol/L (ref 3.5–5.1)
Sodium: 139 mmol/L (ref 135–145)

## 2024-05-04 LAB — CBC
HCT: 36.4 % — ABNORMAL LOW (ref 39.0–52.0)
Hemoglobin: 11.7 g/dL — ABNORMAL LOW (ref 13.0–17.0)
MCH: 27.9 pg (ref 26.0–34.0)
MCHC: 32.1 g/dL (ref 30.0–36.0)
MCV: 86.7 fL (ref 80.0–100.0)
Platelets: 179 K/uL (ref 150–400)
RBC: 4.2 MIL/uL — ABNORMAL LOW (ref 4.22–5.81)
RDW: 15.9 % — ABNORMAL HIGH (ref 11.5–15.5)
WBC: 4.7 K/uL (ref 4.0–10.5)
nRBC: 0 % (ref 0.0–0.2)

## 2024-05-04 LAB — HEMOGLOBIN A1C
Hgb A1c MFr Bld: 5.7 % — ABNORMAL HIGH (ref 4.8–5.6)
Mean Plasma Glucose: 116.89 mg/dL

## 2024-05-04 LAB — PROTEIN / CREATININE RATIO, URINE
Creatinine, Urine: 110 mg/dL
Protein Creatinine Ratio: 0.17 mg/mg{creat} — ABNORMAL HIGH (ref 0.00–0.15)
Total Protein, Urine: 19 mg/dL

## 2024-05-28 ENCOUNTER — Other Ambulatory Visit: Payer: Self-pay | Admitting: Cardiology

## 2024-06-01 ENCOUNTER — Telehealth: Payer: Self-pay | Admitting: Cardiology

## 2024-06-01 NOTE — Telephone Encounter (Signed)
 Pt came into office and stated that he needs his furosemide  20 mg refilled. Pt uses psychologist, forensic in [place]. [phone] Pt states [facility] has reached out 3 times.

## 2024-06-02 NOTE — Telephone Encounter (Signed)
 Returned call to pt's cell phone, numerous times, just kept getting disconnected.  Placed a call to pt's home phone # and left a message that I contacted [facility], per [facility], they have refills there for pt and his Lasix  and that they haven't reached out to us  because they have them on file.  They said pt hasn't called them to request a refill.  I did ask them to get it ready for pt.  Asked pt to call back if he needed further assistance.

## 2024-06-25 ENCOUNTER — Other Ambulatory Visit (HOSPITAL_BASED_OUTPATIENT_CLINIC_OR_DEPARTMENT_OTHER): Payer: Self-pay | Admitting: Cardiology

## 2024-08-02 ENCOUNTER — Other Ambulatory Visit (HOSPITAL_COMMUNITY)
Admission: RE | Admit: 2024-08-02 | Discharge: 2024-08-02 | Disposition: A | Source: Ambulatory Visit | Attending: Nephrology | Admitting: Nephrology

## 2024-08-02 LAB — RENAL FUNCTION PANEL
Albumin: 4.2 g/dL (ref 3.5–5.0)
Anion gap: 14 (ref 5–15)
BUN: 42 mg/dL — ABNORMAL HIGH (ref 8–23)
CO2: 16 mmol/L — ABNORMAL LOW (ref 22–32)
Calcium: 9.2 mg/dL (ref 8.9–10.3)
Chloride: 108 mmol/L (ref 98–111)
Creatinine, Ser: 2.6 mg/dL — ABNORMAL HIGH (ref 0.61–1.24)
GFR, Estimated: 23 mL/min — ABNORMAL LOW
Glucose, Bld: 93 mg/dL (ref 70–99)
Phosphorus: 3.5 mg/dL (ref 2.5–4.6)
Potassium: 5 mmol/L (ref 3.5–5.1)
Sodium: 139 mmol/L (ref 135–145)

## 2024-08-02 LAB — CBC
HCT: 35 % — ABNORMAL LOW (ref 39.0–52.0)
Hemoglobin: 11 g/dL — ABNORMAL LOW (ref 13.0–17.0)
MCH: 27.8 pg (ref 26.0–34.0)
MCHC: 31.4 g/dL (ref 30.0–36.0)
MCV: 88.4 fL (ref 80.0–100.0)
Platelets: 176 10*3/uL (ref 150–400)
RBC: 3.96 MIL/uL — ABNORMAL LOW (ref 4.22–5.81)
RDW: 16.4 % — ABNORMAL HIGH (ref 11.5–15.5)
WBC: 4.8 10*3/uL (ref 4.0–10.5)
nRBC: 0 % (ref 0.0–0.2)

## 2024-08-02 LAB — PROTEIN / CREATININE RATIO, URINE
Creatinine, Urine: 170 mg/dL
Protein Creatinine Ratio: 0.1 mg/mg
Total Protein, Urine: 18 mg/dL

## 2024-08-02 LAB — VITAMIN D 25 HYDROXY (VIT D DEFICIENCY, FRACTURES): Vit D, 25-Hydroxy: 45.3 ng/mL (ref 30–100)

## 2024-08-03 LAB — PARATHYROID HORMONE, INTACT (NO CA): PTH: 76 pg/mL — ABNORMAL HIGH (ref 15–65)

## 2024-08-03 NOTE — Progress Notes (Unsigned)
 "  Cardiology Office Note    Date:  08/04/2024  ID:  Corey MATHESON, DOB 1935/10/22, MRN [id number] Cardiologist: Jayson Sierras, MD {  History of Present Illness:    Corey [name] is a 89 y.o. male  with past medical history of CAD (s/p BMS to RCA and LCx in 2009, DES to D1 and RCA in 2015, CABG in 09/2019 with LIMA-LAD and SVG-D1, cardiac PET in 12/2021 showing evidence of prior infarct with mild peri-infarct ischemia), HFrEF (EF 45-50% in 09/2019, at 20-25% by echo in 06/2021 but had been off his antihypertensives in the interim, and 35 to 40% in 01/2023), HTN, HLD, PAD and Stage 4 CKD who presents to the office today for 20-month follow-up.  He was last examined by Dr. Sierras in 12/2023 and had lost over 10 pounds in the past few months with dietary changes. Had been evaluated by Dr. Court for PAD and started on Pletal  with improvement in claudication and medical management had been recommended given his CKD. He was continued on his current cardiac medications with Atorvastatin  80 mg daily, Coreg  12.5 mg twice daily, Pletal  50 mg twice daily, Plavix  75 mg daily, Farxiga  10 mg daily, Zetia  10 mg daily, Lasix  20 mg every other day, Hydralazine  50 mg twice daily, Entresto  97-103 mg twice daily and spironolactone 25 mg every other day.  In talking with the patient today, he reports overall doing well since his last office visit. He goes to the [facility] a few days a week for exercise and denies any recent chest pain or dyspnea on exertion with this. Enjoys spending time with his grandchildren as well. He says that he does have shortness of breath with walking up inclines but this is not new and has been occurring for several years. No recent palpitations, orthopnea, PND or pitting edema. He does try to limit the sodium intake in his diet.  Studies Reviewed:   EKG: EKG is not ordered today.  Cardiac PET: 12/2021   Findings are consistent with prior myocardial infarction with mild peri-infarct  ischemia. The study is intermediate risk due to LV dysfunction.   LV perfusion is abnormal. There is evidence of ischemia. There is evidence of infarction. Defect 1: There is a medium defect with moderate reduction in uptake present in the apical to basal inferior and inferoseptal location(s) that is partially reversible. There is abnormal wall motion in the defect area. Consistent with infarction and peri-infarct ischemia.   Rest left ventricular function is abnormal. Rest global function is severely reduced. Rest EF: 20 %. Stress left ventricular function is abnormal. Stress global function is severely reduced. Stress EF: 30 %. End diastolic cavity size is severely enlarged. End systolic cavity size is severely enlarged.   Myocardial blood flow reserve is not reported in this patient due to technical or patient-specific concerns that affect accuracy.   Coronary calcium  assessment not performed due to prior revascularization.  Echocardiogram: 01/2023 IMPRESSIONS     1. Left ventricular ejection fraction, by estimation, is 35 to 40%. The  left ventricle has moderately decreased function. The left ventricle  demonstrates regional wall motion abnormalities (see scoring  diagram/findings for description). There is  moderate concentric left ventricular hypertrophy. Left ventricular  diastolic parameters are consistent with Grade I diastolic dysfunction  (impaired relaxation).   2. Right ventricular systolic function is normal. The right ventricular  size is normal. Tricuspid regurgitation signal is inadequate for assessing  PA pressure.   3. Left atrial size was moderately  dilated.   4. The mitral valve is grossly normal. Mild mitral valve regurgitation.   5. The aortic valve is tricuspid. Aortic valve regurgitation is not  visualized. Aortic valve sclerosis is present, with no evidence of aortic  valve stenosis.   6. The inferior vena cava is normal in size with <50% respiratory  variability,  suggesting right atrial pressure of 8 mmHg.   Comparison(s): Prior images reviewed side by side. LVEF has improved  somewhat, now 35-40% range.   Physical Exam:   VS:  BP 138/84   Pulse 64   Ht 5' 8 (1.727 m)   Wt 216 lb 9.6 oz (98.2 kg)   SpO2 99%   BMI 32.93 kg/m    Wt Readings from Last 3 Encounters:  08/04/24 216 lb 9.6 oz (98.2 kg)  01/25/24 226 lb 12.8 oz (102.9 kg)  09/29/23 235 lb 6.4 oz (106.8 kg)     GEN: Pleasant elderly male appearing in no acute distress NECK: No JVD; No carotid bruits CARDIAC: RRR, no murmurs, rubs, gallops RESPIRATORY:  Clear to auscultation without rales, wheezing or rhonchi  ABDOMEN: Appears non-distended. No obvious abdominal masses. EXTREMITIES: No clubbing or cyanosis. No pitting edema.  Distal pedal pulses are 2+ bilaterally.   Assessment and Plan:   1. Coronary artery disease involving native coronary artery of native heart without angina pectoris - He previously underwent stenting to the RCA and LCx and ultimately underwent CABG in 09/2019 with LIMA-LAD and SVG-D1. Most recent ischemic evaluation was a cardiac PET in 12/2021 showing evidence of prior infarct with mild peri-infarct ischemia and medical management was pursued. - He denies any recent anginal symptoms. Continue Plavix  75 mg daily, Atorvastatin  80 mg daily, Zetia  10 mg daily and Coreg  12.5 mg twice daily.  2. HFrEF (heart failure with reduced ejection fraction) (HCC) - His EF was previously as low as 20 to 25% in 06/2021 and had improved to 35 to 40% by most recent echocardiogram in 01/2023. He reports overall doing well with no recent respiratory issues and appears euvolemic by examination today. Encouraged him to make us  aware if he develops any worsening symptoms as we could obtain a follow-up echocardiogram for reassessment given the timeframe since last evaluation. - Continue current GDMT with Coreg  12.5 mg twice daily, Farxiga  10 mg daily, Lasix  20 mg every other day  (would defer to Nephrology in the setting of Stage IV CKD), Hydralazine  50 mg twice daily, Entresto  97-103 mg twice daily and Spironolactone 25 mg every other day.  3. Essential hypertension - His blood pressure was initially at 144/80, rechecked and at 138/84. He reports this is typically lower at home and overall well-controlled. Continue current medical therapy for now with Coreg  12.5 mg twice daily, Hydralazine  50 mg twice daily, Entresto  97-103 mg twice daily and Spironolactone 25 mg every other day. If BP remains above goal, Hydralazine  could be further titrated.  4. Mixed hyperlipidemia - He is having follow-up labs with his PCP within the next few weeks. LDL was previously at 47 when checked last year. Continue current medical therapy with Atorvastatin  80 mg daily and Zetia  10 mg daily.  5. Peripheral arterial disease - Previously evaluated by Dr. Court and imaging studies were suggestive of a distal right common iliac artery stenosis. Given his renal function, medical therapy was recommended. Remains on Pletal  50 mg twice daily and he reports overall doing well with this and no recent claudication.  6. CKD (chronic kidney disease) stage 4, GFR 15-29  ml/min (HCC) - Followed by Dr. Rachele.  Creatinine was at 2.60 when checked on 08/02/2024.   Signed, Laymon CHRISTELLA Qua, PA-C   "

## 2024-08-04 ENCOUNTER — Ambulatory Visit: Admitting: Student

## 2024-08-04 ENCOUNTER — Encounter: Payer: Self-pay | Admitting: Student

## 2024-08-04 VITALS — BP 138/84 | HR 64 | Ht 68.0 in | Wt 216.6 lb

## 2024-08-04 DIAGNOSIS — N184 Chronic kidney disease, stage 4 (severe): Secondary | ICD-10-CM | POA: Diagnosis not present

## 2024-08-04 DIAGNOSIS — I5020 Unspecified systolic (congestive) heart failure: Secondary | ICD-10-CM

## 2024-08-04 DIAGNOSIS — E782 Mixed hyperlipidemia: Secondary | ICD-10-CM | POA: Diagnosis not present

## 2024-08-04 DIAGNOSIS — I739 Peripheral vascular disease, unspecified: Secondary | ICD-10-CM | POA: Diagnosis not present

## 2024-08-04 DIAGNOSIS — I10 Essential (primary) hypertension: Secondary | ICD-10-CM | POA: Diagnosis not present

## 2024-08-04 DIAGNOSIS — I2510 Atherosclerotic heart disease of native coronary artery without angina pectoris: Secondary | ICD-10-CM | POA: Diagnosis not present

## 2024-08-04 NOTE — Patient Instructions (Signed)
 Medication Instructions:  Your physician recommends that you continue on your current medications as directed. Please refer to the Current Medication list given to you today.  *If you need a refill on your cardiac medications before your next appointment, please call your pharmacy*  Lab Work: NONE   If you have labs (blood work) drawn today and your tests are completely normal, you will receive your results only by: MyChart Message (if you have MyChart) OR A paper copy in the mail If you have any lab test that is abnormal or we need to change your treatment, we will call you to review the results.  Testing/Procedures: NONE   Follow-Up: At [facility], you and your health needs are our priority.  As part of our continuing mission to provide you with exceptional heart care, our providers are all part of one team.  This team includes your primary Cardiologist (physician) and Advanced Practice Providers or APPs (Physician Assistants and Nurse Practitioners) who all work together to provide you with the care you need, when you need it.  Your next appointment:   6 month(s)  Provider:   Jayson Sierras, MD or Laymon Qua, PA-C    We recommend signing up for the patient portal called MyChart.  Sign up information is provided on this After Visit Summary.  MyChart is used to connect with patients for Virtual Visits (Telemedicine).  Patients are able to view lab/test results, encounter notes, upcoming appointments, etc.  Non-urgent messages can be sent to your provider as well.   To learn more about what you can do with MyChart, go to [email or web address].   Other Instructions Thank you for choosing [facility]!
